# Patient Record
Sex: Female | Born: 1960
Health system: Southern US, Community
[De-identification: ages and names within clinical notes are randomized; demographics above are authoritative.]

## PROBLEM LIST (undated history)

## (undated) DIAGNOSIS — F329 Major depressive disorder, single episode, unspecified: Secondary | ICD-10-CM

## (undated) DIAGNOSIS — F32A Depression, unspecified: Secondary | ICD-10-CM

## (undated) DIAGNOSIS — F419 Anxiety disorder, unspecified: Secondary | ICD-10-CM

## (undated) DIAGNOSIS — F1910 Other psychoactive substance abuse, uncomplicated: Secondary | ICD-10-CM

## (undated) DIAGNOSIS — G2581 Restless legs syndrome: Secondary | ICD-10-CM

## (undated) HISTORY — DX: Major depressive disorder, single episode, unspecified: F32.9

## (undated) HISTORY — DX: Other psychoactive substance abuse, uncomplicated: F19.10

## (undated) HISTORY — DX: Depression, unspecified: F32.A

## (undated) HISTORY — DX: Anxiety disorder, unspecified: F41.9

---

## 2013-04-01 ENCOUNTER — Ambulatory Visit (INDEPENDENT_AMBULATORY_CARE_PROVIDER_SITE_OTHER): Payer: No Typology Code available for payment source | Admitting: Emergency Medicine

## 2013-04-01 VITALS — BP 150/92 | HR 90 | Temp 98.2°F | Resp 17 | Ht 68.0 in | Wt 158.0 lb

## 2013-04-01 DIAGNOSIS — I10 Essential (primary) hypertension: Secondary | ICD-10-CM

## 2013-04-01 DIAGNOSIS — Z1211 Encounter for screening for malignant neoplasm of colon: Secondary | ICD-10-CM

## 2013-04-01 DIAGNOSIS — N951 Menopausal and female climacteric states: Secondary | ICD-10-CM

## 2013-04-01 DIAGNOSIS — F329 Major depressive disorder, single episode, unspecified: Secondary | ICD-10-CM | POA: Insufficient documentation

## 2013-04-01 DIAGNOSIS — R232 Flushing: Secondary | ICD-10-CM

## 2013-04-01 DIAGNOSIS — F3289 Other specified depressive episodes: Secondary | ICD-10-CM

## 2013-04-01 DIAGNOSIS — F32A Depression, unspecified: Secondary | ICD-10-CM

## 2013-04-01 DIAGNOSIS — F411 Generalized anxiety disorder: Secondary | ICD-10-CM

## 2013-04-01 LAB — POCT CBC
HCT, POC: 43.8 % (ref 37.7–47.9)
Hemoglobin: 13.4 g/dL (ref 12.2–16.2)
Lymph, poc: 1.7 (ref 0.6–3.4)
MCH, POC: 28.6 pg (ref 27–31.2)
MCHC: 30.6 g/dL — AB (ref 31.8–35.4)
MPV: 9.8 fL (ref 0–99.8)
RBC: 4.69 M/uL (ref 4.04–5.48)
WBC: 6.7 10*3/uL (ref 4.6–10.2)

## 2013-04-01 MED ORDER — ALPRAZOLAM 0.5 MG PO TABS: ORAL_TABLET | ORAL | Status: DC
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
## 2013-04-02 LAB — TSH: TSH: 0.736 u[IU]/mL (ref 0.350–4.500)

## 2013-04-02 LAB — LIPID PANEL
Cholesterol: 278 mg/dL — ABNORMAL HIGH (ref 0–200)
Triglycerides: 78 mg/dL (ref <150)
VLDL: 16 mg/dL (ref 0–40)

## 2013-04-02 LAB — COMPREHENSIVE METABOLIC PANEL
ALT: 24 U/L (ref 0–35)
CO2: 24 mEq/L (ref 19–32)
Calcium: 9.5 mg/dL (ref 8.4–10.5)
Chloride: 100 mEq/L (ref 96–112)
Creat: 0.64 mg/dL (ref 0.50–1.10)
Glucose, Bld: 83 mg/dL (ref 70–99)
Sodium: 136 mEq/L (ref 135–145)
Total Bilirubin: 1.8 mg/dL — ABNORMAL HIGH (ref 0.3–1.2)
Total Protein: 7.9 g/dL (ref 6.0–8.3)

## 2013-04-06 ENCOUNTER — Ambulatory Visit: Payer: No Typology Code available for payment source | Admitting: Family Medicine

## 2013-04-13 ENCOUNTER — Ambulatory Visit (INDEPENDENT_AMBULATORY_CARE_PROVIDER_SITE_OTHER): Payer: No Typology Code available for payment source | Admitting: Gynecology

## 2013-04-13 ENCOUNTER — Encounter: Payer: Self-pay | Admitting: Gynecology

## 2013-04-13 VITALS — BP 120/78 | Ht 68.0 in | Wt 157.0 lb

## 2013-04-13 DIAGNOSIS — A499 Bacterial infection, unspecified: Secondary | ICD-10-CM

## 2013-04-13 DIAGNOSIS — N926 Irregular menstruation, unspecified: Secondary | ICD-10-CM

## 2013-04-13 DIAGNOSIS — N898 Other specified noninflammatory disorders of vagina: Secondary | ICD-10-CM

## 2013-04-13 DIAGNOSIS — Z01419 Encounter for gynecological examination (general) (routine) without abnormal findings: Secondary | ICD-10-CM

## 2013-04-13 DIAGNOSIS — B9689 Other specified bacterial agents as the cause of diseases classified elsewhere: Secondary | ICD-10-CM

## 2013-04-13 DIAGNOSIS — N760 Acute vaginitis: Secondary | ICD-10-CM

## 2013-04-13 LAB — TSH: TSH: 1.48 u[IU]/mL (ref 0.350–4.500)

## 2013-04-13 LAB — WET PREP FOR TRICH, YEAST, CLUE
Trich, Wet Prep: NONE SEEN
Yeast Wet Prep HPF POC: NONE SEEN

## 2013-04-13 MED ORDER — METRONIDAZOLE 500 MG PO TABS: 500.0000 mg | ORAL_TABLET | Freq: Two times a day (BID) | ORAL | Status: DC

## 2013-04-13 NOTE — Patient Instructions (Addendum)
Followup for ultrasound as scheduled  Colonoscopy  or [facility]  Call to Schedule your mammogram  Facilities in [place]: 1)  [facility], [address] Phone: [phone] 2)  [facility]. [address] [place] Phone: [phone] 3)  Dr. Isaiah Blakes at [facility] [address] Phone: [phone]     Mammogram A mammogram is an X-ray test to find changes in a woman's breast. You should get a mammogram if:  You are 48 years of age or older  You have risk factors.   Your doctor recommends that you have one.  BEFORE THE TEST  Do not schedule the test the week before your period, especially if your breasts are sore during this time.  On the day of your mammogram:  Wash your breasts and armpits well. After washing, do not put on any deodorant or talcum powder on until after your test.   Eat and drink as you usually do.   Take your medicines as usual.   If you are diabetic and take insulin, make sure you:   Eat before coming for your test.   Take your insulin as usual.   If you cannot keep your appointment, call before the appointment to cancel. Schedule another appointment.  TEST  You will need to undress from the waist up. You will put on a hospital gown.   Your breast will be put on the mammogram machine, and it will press firmly on your breast with a piece of plastic called a compression paddle. This will make your breast flatter so that the machine can X-ray all parts of your breast.   Both breasts will be X-rayed. Each breast will be X-rayed from above and from the side. An X-ray might need to be taken again if the picture is not good enough.   The mammogram will last about 15 to 30 minutes.  AFTER THE TEST Finding out the results of your test Ask when your test results will be ready. Make sure you get your test results.  Document Released:  02/14/2009 Document Revised: 11/07/2011 Document Reviewed: 02/14/2009 [facility] Patient Information 2012 [place].

## 2013-04-13 NOTE — Progress Notes (Signed)
Victoria Osborne June 17, 1961 [id number]        51 y.o.  [id number] New patient for annual exam and complaints of breakthrough bleeding. The patient notes in the past year consistent midcycle bleeding where she will require a tampon almost like having a second menses each month. Otherwise doing well without history of gynecologic abnormalities.  Past medical history,surgical history, medications, allergies, family history and social history were all reviewed and documented in the EPIC chart. ROS:  Was performed and pertinent positives and negatives are included in the history.  Exam: Kim assistant Filed Vitals:   04/13/13 0834  BP: 120/78  Height: 5\' 8"  (1.727 m)  Weight: 157 lb (71.215 kg)   General appearance  Normal Skin grossly normal Head/Neck normal with no cervical or supraclavicular adenopathy thyroid normal Lungs  clear Cardiac RR, without RMG Abdominal  soft, nontender, without masses, organomegaly or hernia Breasts  examined lying and sitting without masses, retractions, discharge or axillary adenopathy. Pelvic  Ext/BUS/vagina heavy yellow discharge.  Cervix  normal anterior high in the vagina  Uterus  retroverted/retroflexed, normal size, shape and contour, midline and mobile nontender   Adnexa  Without masses or tenderness    Anus and perineum  normal   Rectovaginal  normal sphincter tone without palpated masses or tenderness.    Assessment/Plan:  52 y.o. G65P1011 female for annual exam.   1. Consistent midcycle bleeding. Discussed possibilities to include hormonal dysfunction versus structural such as polyps. Not having other symptoms such as hot flushes night sweats. We'll start with [place] TSH prolactin and sonohysterogram. Patient will followup for the ultrasound and we'll go from there. 2. Heavy yellow discharge. Patient did not note initially but on questioning states that she did notice a heavier discharge. Wet prep consistent with bacterial vaginosis. Flagyl 500 mg  twice a day x7 days at patient's choice. Alcohol avoidance reviewed. 3. Mammography 01/2012. Due now and she knows to schedule. Names of facilities in [place] provided. SBE monthly reviewed. 4. Pap smear 2013. No Pap smear done today. No history of abnormal Pap smears previously. Plan repeat that 3 year interval. 5. Colonoscopy never. Recommend the current screening guidelines and she will arrange this coming year. 6. Health maintenance. Recently saw Dr. Everlene Farrier at [facility]. Does have elevated cholesterol and LDL. I stressed the need to continue to followup with him in reference to this and possible need for medication treatment. followup ultrasound and lab work.Anastasio Auerbach MD, 9:24 AM 04/13/2013

## 2013-04-16 ENCOUNTER — Encounter: Payer: Self-pay | Admitting: Family Medicine

## 2013-04-16 ENCOUNTER — Ambulatory Visit (INDEPENDENT_AMBULATORY_CARE_PROVIDER_SITE_OTHER): Payer: No Typology Code available for payment source | Admitting: Family Medicine

## 2013-04-16 VITALS — BP 110/76 | HR 65 | Temp 98.0°F | Resp 16 | Ht 68.0 in | Wt 166.2 lb

## 2013-04-16 DIAGNOSIS — F418 Other specified anxiety disorders: Secondary | ICD-10-CM

## 2013-04-16 DIAGNOSIS — F341 Dysthymic disorder: Secondary | ICD-10-CM

## 2013-04-16 DIAGNOSIS — F1021 Alcohol dependence, in remission: Secondary | ICD-10-CM

## 2013-04-16 MED ORDER — FLUOXETINE HCL 40 MG PO CAPS: 40.0000 mg | ORAL_CAPSULE | Freq: Every day | ORAL | Status: DC

## 2013-04-18 DIAGNOSIS — F1021 Alcohol dependence, in remission: Secondary | ICD-10-CM | POA: Insufficient documentation

## 2013-04-18 NOTE — Progress Notes (Signed)
S:  This 52 y.o. Cauc female is here for follow-up of PTSD/ depression; she was evaluated by Dr. Everlene Farrier at [facility] on 04/02/2013. She is taking prescribed medications and feels better; no longer suicidal or having thoughts of self harm. She is planning to  sch appt w/ Dr. Yehuda Mao (psych) in the coming week. She has GYN evaluation earlier this week and MMG is scheduled. Pt states she is attending [facility] meetings. Currently living w/ her parents. Pt has hx of recurrent depression; most recent episode triggered by divorce (not amicable) and financial difficulties. Pt states current dose of Fluoxetine 20 mg not very effective. After discussion, she is agreeable to increasing dose to 30 or 40 mg. She admits some concentration difficulties.  PMHx, Soc Hx and Fam Hx reviewed.  ROS: As per HPI; negative for anorexia, diaphoresis, CP or tightness, palpitations, SOB or DOE, GI problems, HA, dizziness, paresthesias, weakness, tremor, increased irritability or agitation, confusion, halluciantions, SI/HI.  O:  Filed Vitals:   04/16/13 1133  BP: 110/76  Pulse: 65  Temp: 98 F (36.7 C)  Resp: 16   GEN: In NAD; WN,WD. Pt smells off alcohol. HENT: [place]/AT; EOMI w/ injected conj/ no icteric sclerae. Oroph clear and moist. COR: RRR. LUNGS: Normal resp rate and effort. SKIN: W&D; slightly ruddy complexion. NEURO: A&O x 3; CNs intact. Psych- pt appears anxious w/o agitation. Fidgets while seated but conversant and has good eye contact. Speech pattern and thought content are normal. Judgement is sound.  A/P: Depression with anxiety- Increase Fluoxetine dose to 40 mg (pt agreeable to this as there is no 30 mg capsule available).                                              Pt to schedule appt w/ Dr. Casimiro Needle for further medication adjustments.                                              Continue Alprazolam hs prn; I contacted the pharmacy to authorize early refill on this medication so that pt would not run out.  Meds  ordered this encounter  Medications  . FLUoxetine (PROZAC) 40 MG capsule    Sig: Take 1 capsule (40 mg total) by mouth daily.    Dispense:  30 capsule    Refill:  2

## 2013-05-05 ENCOUNTER — Ambulatory Visit: Payer: No Typology Code available for payment source | Admitting: Gynecology

## 2013-05-05 ENCOUNTER — Other Ambulatory Visit: Payer: No Typology Code available for payment source

## 2013-05-11 ENCOUNTER — Other Ambulatory Visit: Payer: Self-pay | Admitting: Gynecology

## 2013-05-11 DIAGNOSIS — N926 Irregular menstruation, unspecified: Secondary | ICD-10-CM

## 2013-05-26 ENCOUNTER — Ambulatory Visit (INDEPENDENT_AMBULATORY_CARE_PROVIDER_SITE_OTHER): Payer: No Typology Code available for payment source | Admitting: Gynecology

## 2013-05-26 ENCOUNTER — Encounter: Payer: Self-pay | Admitting: Gynecology

## 2013-05-26 ENCOUNTER — Ambulatory Visit (INDEPENDENT_AMBULATORY_CARE_PROVIDER_SITE_OTHER): Payer: No Typology Code available for payment source

## 2013-05-26 DIAGNOSIS — N83209 Unspecified ovarian cyst, unspecified side: Secondary | ICD-10-CM

## 2013-05-26 DIAGNOSIS — N912 Amenorrhea, unspecified: Secondary | ICD-10-CM

## 2013-05-26 DIAGNOSIS — N854 Malposition of uterus: Secondary | ICD-10-CM

## 2013-05-26 DIAGNOSIS — N949 Unspecified condition associated with female genital organs and menstrual cycle: Secondary | ICD-10-CM

## 2013-05-26 DIAGNOSIS — N926 Irregular menstruation, unspecified: Secondary | ICD-10-CM

## 2013-05-26 DIAGNOSIS — N882 Stricture and stenosis of cervix uteri: Secondary | ICD-10-CM

## 2013-05-26 DIAGNOSIS — R102 Pelvic and perineal pain: Secondary | ICD-10-CM

## 2013-05-26 DIAGNOSIS — N852 Hypertrophy of uterus: Secondary | ICD-10-CM

## 2013-05-26 LAB — PREGNANCY, URINE: Preg Test, Ur: NEGATIVE

## 2013-05-26 MED ORDER — LIDOCAINE HCL 1 % IJ SOLN
6.0000 mL | Freq: Once | INTRAMUSCULAR | Status: AC
  Administered 2013-05-26: 6 mL

## 2013-05-26 NOTE — Progress Notes (Signed)
Patient presents for sonohysterogram with history of recurrent intermenstrual bleeding. Recent lab work to include TSH [place] prolactin were all normal. Urine pregnancy test today was negative.  Ultrasound shows uterus generous in size. Endometrial echo 11.2 mm. Right ovary with thin walled simple avascular cyst 31 x 23 x 20 mm. Left ovary normal. Cul-de-sac negative.  Procedure: Due to her significant retroversion and retroflexion a paracervical block using 1% lidocaine 8 cc total was placed after cleansing the cervix and upper vagina with Betadine, single-tooth tenaculum anterior lip stabilization required to straighten the cervix and ultimately the catheter was placed under sterile technique and the sonohysterogram was performed with good distention and no abnormalities. Endometrial sample taken. Patient tolerated well.  Assessment and plan: Intermenstrual bleeding. Normal hormone studies to include FSH 2.4. Sonohysterogram with small benign-appearing cyst right ovary otherwise normal. Patient will followup for biopsy results. Options for management of her irregular bleeding discussed to include observation, hormonal manipulation such as low-dose birth control pills or intermittent progesterone withdrawal, endometrial ablation, Mirena IUD, hysterectomy. She does have a history of cigarette smoking but has quit over 20 years ago. The issues of possible increased risk of stroke, heart attack, DVT with birth control pills reviewed. The need for consistent birth control with ablation also discussed. Benefits of Mirena IUD to include contraception as well as menstrual suppression reviewed. In the absence of significant pathology such as leiomyoma, endometrial polyps or submucous myomas and other complaints such as pelvic pain I feel hysterectomy should be last option. I did recommend repeating her ultrasound in 3 months to repeat check the right ovary to make sure the cyst resolves. In the interim she will keep a  menstrual calendar and we will see what her bleeding pattern is and go from there and she is comfortable with this approach.

## 2013-05-26 NOTE — Patient Instructions (Addendum)
Options for management of her irregular bleeding discussed to include observation, hormonal manipulation such as low-dose birth control pills or intermittent progesterone withdrawal, endometrial ablation, Mirena IUD, hysterectomy. She does have a history of cigarette smoking but has quit over 20 years ago. The issues of possible increased risk of stroke, heart attack, DVT with birth control pills reviewed. The need for consistent birth control with ablation also discussed. Benefits of Mirena IUD to include contraception as well as menstrual suppression reviewed. In the absence of significant pathology such as leiomyoma, endometrial polyps or submucous myomas and other complaints such as pelvic pain I feel hysterectomy should be last option. I did recommend repeating her ultrasound in 3 months to repeat check the right ovary to make sure the cyst resolves. In the interim she will keep a menstrual calendar and we will see what her bleeding pattern is and go from there

## 2013-06-12 DIAGNOSIS — L255 Unspecified contact dermatitis due to plants, except food: Secondary | ICD-10-CM | POA: Insufficient documentation

## 2013-08-13 ENCOUNTER — Ambulatory Visit (INDEPENDENT_AMBULATORY_CARE_PROVIDER_SITE_OTHER): Payer: No Typology Code available for payment source | Admitting: Emergency Medicine

## 2013-08-13 VITALS — BP 118/96 | HR 71 | Temp 99.0°F | Resp 18 | Ht 67.75 in | Wt 159.0 lb

## 2013-08-13 DIAGNOSIS — F1011 Alcohol abuse, in remission: Secondary | ICD-10-CM

## 2013-08-13 MED ORDER — ACAMPROSATE CALCIUM 333 MG PO TBEC: 666.0000 mg | DELAYED_RELEASE_TABLET | Freq: Three times a day (TID) | ORAL | Status: DC

## 2013-08-13 NOTE — Progress Notes (Signed)
  Subjective:    Patient ID: Victoria Osborne, female    DOB: 07-10-1961, 52 y.o.   MRN: [id number]  HPI  52 y.o. Female that presents to clinic for alcohol relapse. Went to rehab 1 year ago and has sense had relapse. Pt took campral too help with this process , would like to start this ago. Hasn't taken since December.  Having trouble with depression and is currently going through a divorce. Recently moved from [place] in february and is starting over. Has started back with AA and has appointment with psychologist in a month and a half. Is currently staying with parents. Was unable to see psychiatrist. Last drink was Monday.  Recently started new job in [place] on Aug. 18th. Rehab last year was for one month and was discharged on meds to stop drinking for 3 months.   Review of Systems     Objective:   Physical Exam patient is alert and cooperative. She is conversant. She's not in any distress. She does not appear sweaty or anxious.        Assessment & Plan:  Patient here to discuss her alcohol addiction problem. She is going back today and go on a daily basis she is here with her mother who is going administer her medications she was given the number of Dr. Casimiro Needle in followup. She was instructed to follow up with me in 2 weeks. She will be on Campral to 3 times a day

## 2013-08-25 ENCOUNTER — Other Ambulatory Visit: Payer: No Typology Code available for payment source

## 2013-08-25 ENCOUNTER — Ambulatory Visit: Payer: No Typology Code available for payment source | Admitting: Gynecology

## 2013-09-18 ENCOUNTER — Other Ambulatory Visit: Payer: Self-pay | Admitting: Family Medicine

## 2013-11-08 LAB — CBC WITH DIFFERENTIAL/PLATELET
Basophil #: 0 10*3/uL (ref 0.0–0.1)
Basophil %: 0.7 %
Eosinophil %: 0.7 %
HCT: 42 % (ref 35.0–47.0)
HGB: 13.9 g/dL (ref 12.0–16.0)
Lymphocyte #: 2.2 10*3/uL (ref 1.0–3.6)
Lymphocyte %: 36.4 %
MCHC: 33 g/dL (ref 32.0–36.0)
Monocyte #: 0.5 x10 3/mm (ref 0.2–0.9)
Neutrophil #: 3.2 10*3/uL (ref 1.4–6.5)
Platelet: 227 10*3/uL (ref 150–440)
RBC: 4.82 10*6/uL (ref 3.80–5.20)
WBC: 6 10*3/uL (ref 3.6–11.0)

## 2013-11-08 LAB — URINALYSIS, COMPLETE
Bilirubin,UR: NEGATIVE
Glucose,UR: NEGATIVE mg/dL (ref 0–75)
Ketone: NEGATIVE
Leukocyte Esterase: NEGATIVE
Protein: NEGATIVE
RBC,UR: 1 /HPF (ref 0–5)
Specific Gravity: 1.004 (ref 1.003–1.030)

## 2013-11-08 LAB — COMPREHENSIVE METABOLIC PANEL
Albumin: 4 g/dL (ref 3.4–5.0)
Anion Gap: 9 (ref 7–16)
BUN: 6 mg/dL — ABNORMAL LOW (ref 7–18)
Bilirubin,Total: 1 mg/dL (ref 0.2–1.0)
Calcium, Total: 8.4 mg/dL — ABNORMAL LOW (ref 8.5–10.1)
EGFR (African American): >60
Glucose: 95 mg/dL (ref 65–99)
Osmolality: 279 (ref 275–301)
Potassium: 3.8 mmol/L (ref 3.5–5.1)
SGPT (ALT): 18 U/L (ref 12–78)
Total Protein: 8 g/dL (ref 6.4–8.2)

## 2013-11-08 LAB — DRUG SCREEN, URINE
Amphetamines, Ur Screen: NEGATIVE (ref ?–1000)
MDMA (Ecstasy)Ur Screen: NEGATIVE (ref ?–500)
Opiate, Ur Screen: NEGATIVE (ref ?–300)
Phencyclidine (PCP) Ur S: NEGATIVE (ref ?–25)
Tricyclic, Ur Screen: NEGATIVE (ref ?–1000)

## 2013-11-08 LAB — ETHANOL
Ethanol %: 0.301 % (ref 0.000–0.080)
Ethanol: 301 mg/dL

## 2013-11-09 ENCOUNTER — Inpatient Hospital Stay: Payer: Self-pay | Admitting: Psychiatry

## 2013-11-09 LAB — ETHANOL
Ethanol %: 0.064 % (ref 0.000–0.080)
Ethanol: 64 mg/dL

## 2013-11-15 ENCOUNTER — Ambulatory Visit: Payer: Self-pay | Admitting: Psychiatry

## 2013-12-02 ENCOUNTER — Ambulatory Visit: Payer: Self-pay | Admitting: Psychiatry

## 2014-01-02 ENCOUNTER — Ambulatory Visit: Payer: Self-pay | Admitting: Psychiatry

## 2014-04-18 ENCOUNTER — Telehealth: Payer: Self-pay

## 2014-04-18 NOTE — Telephone Encounter (Signed)
Patient called and left a message 04/15/14 3:46pm stating she wanted to apply to [facility] and "needs a medical record." Returned patient's call this morning and left a message asking what exactly she needs from [place] so that I can complete the request.

## 2014-04-26 NOTE — Telephone Encounter (Signed)
Left message for patient to return call to clarify what she needs.

## 2015-03-24 NOTE — Consult Note (Signed)
Consult: treatment recommendations  This patient was seen at the request of Dr. Bary Leriche, MD in order to explore the possibility of CD-IOP at this inpatient discharge. She was interviewed and oriented to the [facility] (CD-IOP). She was able to express plans at this inpatient discharge of participating in the [facility] CD-IOP.    Electronic Signatures: Laqueta Due (PsyD)  (Signed on 11-Dec-14 00:07)  Authored  Last Updated: 11-Dec-14 00:07 by Laqueta Due (PsyD)

## 2015-03-24 NOTE — Consult Note (Signed)
Brief Consult Note: Diagnosis: Mood disorder NOS, alcohol dependence.   Comments: Ms. Victoria Osborne is too intoxicated for interview. She most likely needs to be admitted.  Electronic Signatures: Orson Slick (MD)  (Signed 08-Dec-14 21:41)  Authored: Brief Consult Note   Last Updated: 08-Dec-14 21:41 by Orson Slick (MD)

## 2015-03-24 NOTE — H&P (Signed)
PATIENT NAME:  Victoria Osborne, Victoria Osborne MR#:  [id number] DATE OF BIRTH:  [birth year]  DATE OF ADMISSION:  11/09/2013  REFERRING PHYSICIAN: Emergency Room MD.   ATTENDING PHYSICIAN: Victoria Krabill B. Bary Leriche, MD  IDENTIFYING DATA: Ms. Victoria Osborne is a 54 year old female with history of alcoholism.   CHIEF COMPLAINT: "My employer brought me here."  HISTORY OF PRESENT ILLNESS: Ms. Victoria Osborne has a long history of drinking, but she has been a functional drunk. A year ago, after she divorced her husband, she relocated from [place], [place], to our area. She currently moved to [place]. 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 She was brought to the Emergency Room the day before when she was found intoxicated and acting strangely at work. Also, multiple suicidal notes were found at her desk. She was brought to the Emergency Room. She was so drunk that we were unable to interview her yesterday. Today, she is sober and able to participate in the conversation. She reports that since she lost her house in [place] 3 years ago, she has been increasingly depressed, but never sought psychiatric help. She describes herself as an episodic drinker, and since she relocated to [place] in February 2014, this is her first drinking episode. This is the first time she has done it at work or wrote a suicide note. She no longer feels suicidal. She has been drinking only since last Friday and came to the Emergency Room on Monday. She reports that on Sunday she was trying to call her family or friends for support, but people were busy. She ended up going to work intoxicated. She has some symptoms of depression with poor sleep, decreased appetite, anhedonia, feeling of guilt, hopelessness, worthlessness, poor energy and concentration, increased anxiety. She denies feeling suicidal, but obviously, when drunk, she did feel like ending her life. She does not remember everything from yesterday. She denies any symptoms of psychosis.  No symptoms suggestive of bipolar mania. She denies, other than alcohol, substance use.   PAST PSYCHIATRIC HISTORY: She attempted suicide once before when she lived in [place] and was about to lose her house. She went to the top of a bridge, but could not make herself jump. She was briefly in psychotherapy in 1995, when she had some issues with her father. She does not want to talk about it. She never tried antidepressants. She has never been in alcohol treatment, although some medication was tried, but she does not remember the name. It was not Antabuse   FAMILY PSYCHIATRIC HISTORY: Father with a drinking problem.   SOCIAL HISTORY: She is now divorced. She has 1 daughter who lives in [place], [place]. She relocated to our area to be closer to her family; however, she apparently does not talk to her father. She is employed as a Pharmacist, hospital in high school. She bought a house in [place]. It is important for her that she returns to work and pays her bills. She has [employer].   REVIEW OF SYSTEMS:  CONSTITUTIONAL: No fevers or chills. No weight changes.  EYES: No double or blurred vision.  ENT: No hearing loss.  RESPIRATORY: No shortness of breath or cough.  CARDIOVASCULAR: No chest pain or orthopnea.  GASTROINTESTINAL: No abdominal pain, nausea, vomiting or diarrhea.  GENITOURINARY: No incontinence or frequency.  ENDOCRINE: No heat or cold intolerance.  LYMPHATIC: No anemia or easy bruising.  INTEGUMENTARY: No acne or rash.  MUSCULOSKELETAL: No muscle or joint pain.  NEUROLOGIC: No tingling or weakness.  PSYCHIATRIC: See history of  present illness for details.   PHYSICAL EXAMINATION:  VITAL SIGNS: Blood pressure 132/92, pulse 121, respirations 20, temperature 98.1.  GENERAL: This is a well-developed female in no acute distress.  HEENT: The pupils are equal, round and reactive to light. Sclerae anicteric.  NECK: Supple. No thyromegaly.  LUNGS: Clear to auscultation. No  dullness to percussion.  HEART: Regular rhythm and rate. No murmurs, rubs or gallops.  ABDOMEN: Soft, nontender, nondistended. Positive bowel sounds.  MUSCULOSKELETAL: Normal muscle strength in all extremities.  SKIN: No rashes or bruises.  LYMPHATIC: No cervical adenopathy.  NEUROLOGIC: Cranial nerves II through XII are intact.   LABORATORY DATA: Chemistries are within normal limits. Blood alcohol level on admission 0.301. LFTs within normal limits. Urine tox screen negative for substances. CBC within normal limits. Urinalysis is not suggestive of urinary tract infection. Serum acetaminophen and salicylates are low.   MENTAL STATUS EXAMINATION ON ADMISSION: The patient is alert and oriented to person, place, time and situation. She is pleasant, polite and cooperative. She maintains good eye contact. Her hygiene is questionable. She is wearing hospital scrubs. Her speech is of normal rhythm, rate and volume. Mood is depressed with anxious affect. Thought process is logical and goal oriented. Thought content: She denies suicidal or homicidal ideation, but was admitted after writing suicidal notes while drunk. There are no delusions or paranoia. There are no auditory or visual hallucinations. Her cognition is grossly intact. She registers 3 out of 3 and recalls 3 out of 3 objects after 5 minutes. She can spell "world" forwards and backwards. She knows the current president. Her insight and judgment are questionable.   SUICIDE RISK ASSESSMENT ON ADMISSION: This is a patient with a long history of alcoholism and some mood instability, who never attempted suicide, who is now under considerable stress and came to the hospital after writing a suicide note. She is at increased risk of suicide.   INITIAL DIAGNOSES:  AXIS I: Alcohol dependence; mood disorder, not otherwise specified.  AXIS II: Deferred.  AXIS III: None.  AXIS IV: Mental illness, substance abuse, social, employment, financial, family  conflict.  AXIS V: Global assessment of functioning 25.   PLAN: The patient was admitted to [facility] for safety, stabilization and medication management. She was initially placed on suicide precautions and was closely monitored for any unsafe behaviors. She underwent full psychiatric and risk assessment. She received pharmacotherapy, individual and group psychotherapy, substance abuse counseling and support from therapeutic milieu.   1. Suicidal ideation: The patient is able to contract for safety.  2. Alcohol detox: She was placed on the standard CIWA protocol. Will monitor for symptoms of alcohol withdrawal.  3. Mood: She is undecided about medications, maybe psychotherapy.  4. Disposition: She will be discharged to home.  ____________________________ Wardell Honour. Bary Leriche, MD jbp:lb D: 11/09/2013 13:56:26 ET T: 11/09/2013 15:09:26 ET JOB#: [id number]  cc: Paislei Dorval B. Bary Leriche, MD, <Dictator> Clovis Fredrickson MD ELECTRONICALLY SIGNED 11/30/2013 4:23

## 2015-03-29 ENCOUNTER — Encounter: Payer: Self-pay | Admitting: Emergency Medicine

## 2015-03-29 ENCOUNTER — Ambulatory Visit (INDEPENDENT_AMBULATORY_CARE_PROVIDER_SITE_OTHER): Payer: Self-pay | Admitting: Emergency Medicine

## 2015-03-29 VITALS — BP 120/96 | HR 112 | Temp 98.2°F | Resp 16 | Ht 67.75 in | Wt 181.0 lb

## 2015-03-29 DIAGNOSIS — F1010 Alcohol abuse, uncomplicated: Secondary | ICD-10-CM

## 2015-03-29 DIAGNOSIS — L409 Psoriasis, unspecified: Secondary | ICD-10-CM

## 2015-03-29 DIAGNOSIS — F1011 Alcohol abuse, in remission: Secondary | ICD-10-CM

## 2015-03-29 DIAGNOSIS — F418 Other specified anxiety disorders: Secondary | ICD-10-CM

## 2015-03-29 MED ORDER — FLUOXETINE HCL 40 MG PO CAPS: 40.0000 mg | ORAL_CAPSULE | Freq: Every day | ORAL | Status: DC

## 2015-03-29 MED ORDER — BETAMETHASONE DIPROPIONATE AUG 0.05 % EX CREA: TOPICAL_CREAM | Freq: Two times a day (BID) | CUTANEOUS | Status: DC

## 2015-03-29 NOTE — Progress Notes (Signed)
   Subjective:    Patient ID: Victoria Osborne, female    DOB: October 27, 1961, 54 y.o.   MRN: [id number] This chart was scribed for Victoria Queen, MD by Zola Button, Medical Scribe. This patient was seen in room 5 and the patient's care was started at 3:09 PM.   HPI HPI Comments: Victoria Osborne is a 54 y.o. female with a hx of depression and psoriasis who presents to the [facility] complaining of darkened areas to both of her elbows, with a nodular area on her left elbow. She also notes a dry area on the top of her forehead. Patient reports hx of psoriasis as a child.  Patient requests refill for Prozac. She takes Prozac for depression and believes she needs to stay on her current dose. Patient has anxiety as well, but she is able to control it without medications. She had been on Campral in the past for alcohol dependence. Patient had problems with alcohol use for about a year when she was dealing with a divorce, but she denies any problems with alcohol currently. She has not been taking Campral and only reports drinking wine occasionally with dinner.  Patient teaches at [employer]  Review of Systems  Psychiatric/Behavioral: Positive for dysphoric mood.       Objective:   Physical Exam CONSTITUTIONAL: Well developed/well nourished. Facial flushing noted HEAD: Normocephalic/atraumatic EYES: EOM/PERRL ENMT: Mucous membranes moist NECK: supple no meningeal signs SPINE: entire spine nontender CV: S1/S2 noted, no murmurs/rubs/gallops noted LUNGS: Lungs are clear to auscultation bilaterally, no apparent distress ABDOMEN: soft, nontender, no rebound or guarding. No upper abdominal tenderness. GU: no cva tenderness NEURO: Pt is awake/alert, moves all extremitiesx4 EXTREMITIES: pulses normal, full ROM SKIN: Psoriatic rash over both elbows. 7x7 mm firm area over the left olecranon. PSYCH: no abnormalities of mood noted        Assessment & Plan:  1.  Depression with anxiety  - FLUoxetine (PROZAC) 40 MG capsule; Take 1 capsule (40 mg total) by mouth daily.   Dispense: 30 capsule; Refill: 11  2. History of ETOH abusePatient states she rarely takes a drink now. She states she has done much better since being on the Prozac. I told her it is very important she make an appointment for a full physical. I told her it is very important she make an appointment for a full physical. She has not had one for years. She said she would  3. Psoriasis  - augmented betamethasone dipropionate (DIPROLENE AF) 0.05 % cream; Apply topically 2 (two) times daily.  Dispense: 30 g; Refill: 2 - Ambulatory referral to Dermatology   I personally performed the services described in this documentation, which was scribed in my presence. The recorded information has been reviewed and is accurate.  Victoria Queen, MD  [facility]  03/29/2015 5:59 PM

## 2015-03-29 NOTE — Patient Instructions (Signed)
Please make an appointment to schedule yourself for a complete physical.

## 2015-05-08 ENCOUNTER — Encounter: Payer: Self-pay | Admitting: *Deleted

## 2015-08-23 ENCOUNTER — Other Ambulatory Visit: Payer: Self-pay

## 2015-08-23 DIAGNOSIS — F418 Other specified anxiety disorders: Secondary | ICD-10-CM

## 2015-08-23 MED ORDER — FLUOXETINE HCL 40 MG PO CAPS: 40.0000 mg | ORAL_CAPSULE | Freq: Every day | ORAL | Status: DC

## 2015-09-05 ENCOUNTER — Encounter: Payer: Self-pay | Admitting: Emergency Medicine

## 2016-01-26 ENCOUNTER — Ambulatory Visit (INDEPENDENT_AMBULATORY_CARE_PROVIDER_SITE_OTHER): Payer: BLUE CROSS/BLUE SHIELD | Admitting: Family Medicine

## 2016-01-26 VITALS — BP 128/88 | HR 90 | Temp 98.2°F | Resp 16 | Ht 67.5 in | Wt 176.8 lb

## 2016-01-26 DIAGNOSIS — M79645 Pain in left finger(s): Secondary | ICD-10-CM

## 2016-01-26 DIAGNOSIS — F1010 Alcohol abuse, uncomplicated: Secondary | ICD-10-CM

## 2016-01-26 DIAGNOSIS — F418 Other specified anxiety disorders: Secondary | ICD-10-CM | POA: Diagnosis not present

## 2016-01-26 DIAGNOSIS — S61215A Laceration without foreign body of left ring finger without damage to nail, initial encounter: Secondary | ICD-10-CM | POA: Diagnosis not present

## 2016-01-26 DIAGNOSIS — S61321A Laceration with foreign body of left index finger with damage to nail, initial encounter: Secondary | ICD-10-CM | POA: Diagnosis not present

## 2016-01-26 DIAGNOSIS — L409 Psoriasis, unspecified: Secondary | ICD-10-CM | POA: Diagnosis not present

## 2016-01-26 DIAGNOSIS — F1011 Alcohol abuse, in remission: Secondary | ICD-10-CM

## 2016-01-26 DIAGNOSIS — S61219A Laceration without foreign body of unspecified finger without damage to nail, initial encounter: Secondary | ICD-10-CM

## 2016-01-26 MED ORDER — BETAMETHASONE DIPROPIONATE AUG 0.05 % EX CREA: TOPICAL_CREAM | Freq: Two times a day (BID) | CUTANEOUS | Status: DC

## 2016-01-26 MED ORDER — HYDROCODONE-ACETAMINOPHEN 5-325 MG PO TABS: 1.0000 | ORAL_TABLET | Freq: Three times a day (TID) | ORAL | Status: DC | PRN

## 2016-01-26 MED ORDER — AMOXICILLIN-POT CLAVULANATE 875-125 MG PO TABS: 1.0000 | ORAL_TABLET | Freq: Two times a day (BID) | ORAL | Status: DC

## 2016-01-26 NOTE — Progress Notes (Signed)
PROCEDURE NOTE: laceration repair Verbal consent obtained from patient.  Local anesthesia with 2cc 0.5% Marcaine.  Wound explored for tendon, ligament damage. Wound scrubbed with soap and water and rinsed. Wound closed with #3 4-0 Prolene 1 horizontal mattress for 4th left finger and 2 horizontal mattress for 2nd left finger. Sutures were loosely approximated. Wound cleansed and dressed.  Jaynee Eagles, PA-C [facility] [phone] 01/26/2016  5:46 PM

## 2016-01-26 NOTE — Patient Instructions (Addendum)

## 2016-01-26 NOTE — Progress Notes (Signed)
 Chief Complaint:  Chief Complaint  Patient presents with  . cut three fingers    hedge trimmer, x 24 hours  . Medication Refill    Diprolene cream 0.05%    HPI: Victoria Osborne is a 55 y.o. female who reports to [facility] today complaining of left 2,3, 4 finger laceration from hedge timmers over 24 hrs ago, she is UTD on there Tdap in 2013.  She has 8/10 pain, 6/10 pain today. Took motrin and tylenol with some relief. No fevers or chills. She has anxiety/depression No weakness or tingling   She has had issue with etoh but not currently, she states she has never had any narcotic dependence. She is not using excessively etoh.   Past Medical History  Diagnosis Date  . Depression   . Anxiety   . Substance abuse    Past Surgical History  Procedure Laterality Date  . Cesarean section     Social History   Social History  . Marital Status: Married    Spouse Name: N/A  . Number of Children: N/A  . Years of Education: N/A   Social History Main Topics  . Smoking status: Former Research scientist (life sciences)  . Smokeless tobacco: None  . Alcohol Use: 6.0 oz/week    10 Standard drinks or equivalent per week     Comment: Rare  . Drug Use: No  . Sexual Activity: Yes    Birth Control/ Protection: Condom   Other Topics Concern  . None   Social History Narrative   Family History  Problem Relation Age of Onset  . Depression Mother   . Diabetes Sister    No Known Allergies Prior to Admission medications   Medication Sig Start Date End Date Taking? Authorizing Provider  augmented betamethasone dipropionate (DIPROLENE AF) 0.05 % cream Apply topically 2 (two) times daily. 03/29/15  Yes Darlyne Russian, MD  FLUoxetine (PROZAC) 40 MG capsule Take 1 capsule (40 mg total) by mouth daily. 08/23/15  Yes Darlyne Russian, MD  amoxicillin-clavulanate (AUGMENTIN) 875-125 MG tablet Take 1 tablet by mouth 2 (two) times daily. 01/26/16    P , DO  HYDROcodone-acetaminophen (NORCO) 5-325 MG tablet Take 1  tablet by mouth every 8 (eight) hours as needed for moderate pain. Take with stool softener, no extra tylenol 01/26/16    P , DO     ROS: The patient denies fevers, chills, night sweats, unintentional weight loss, chest pain, palpitations, wheezing, dyspnea on exertion, nausea, vomiting, abdominal pain, dysuria, hematuria, melena, numbness, weakness, or tingling.   All other systems have been reviewed and were otherwise negative with the exception of those mentioned in the HPI and as above.    PHYSICAL EXAM: Filed Vitals:   01/26/16 1642 01/26/16 1646  BP: 120/90 128/88  Pulse: 90   Temp: 98.2 F (36.8 C)   Resp: 16    Body mass index is 27.27 kg/(m^2).   General: Alert, no acute distress HEENT:  Normocephalic, atraumatic, oropharynx patent. EOMI, PERRLA Cardiovascular:  Regular rate and rhythm, no rubs murmurs or gallops.  No Carotid bruits, radial pulse intact. No pedal edema.  Respiratory: Clear to auscultation bilaterally.  No wheezes, rales, or rhonchi.  No cyanosis, no use of accessory musculature Abdominal: No organomegaly, abdomen is soft and non-tender, positive bowel sounds. No masses. Skin: + wound  Laceration 2,3,4 left fingers  Neurologic: Facial musculature symmetric. Psychiatric: Patient acts appropriately throughout our interaction. Lymphatic: No cervical or submandibular lymphadenopathy Musculoskeletal: Gait intact. No edema,  tenderness   LABS: Results for orders placed or performed in visit on 11/09/13  CBC with Differential/Platelet  Result Value Ref Range   WBC 6.0 3.6-11.0 x10 3/mm 3   RBC 4.82 3.80-5.20 X10 6/mm 3   HGB 13.9 12.0-16.0 g/dL   HCT 42.0 35.0-47.0 %   MCV 87 80-100 fL   MCH 28.8 26.0-34.0 pg   MCHC 33.0 32.0-36.0 g/dL   RDW 16.3 (H) 11.5-14.5 %   Platelet 227 150-440 x10 3/mm 3   Neutrophil % 53.3 %   Lymphocyte % 36.4 %   Monocyte % 8.9 %   Eosinophil % 0.7 %   Basophil % 0.7 %   Neutrophil # 3.2 1.4-6.5 x10 3/mm 3    Lymphocyte # 2.2 1.0-3.6 x10 3/mm 3   Monocyte # 0.5 0.2-0.9 x10 3/mm    Eosinophil # 0.0 0.0-0.7 x10 3/mm 3   Basophil # 0.0 0.0-0.1 x10 3/mm 3  Ethanol  Result Value Ref Range   Ethanol 301 (HH) mg/dL   Ethanol % 0.301 (HH) 0.000-0.080 %  Comprehensive metabolic panel  Result Value Ref Range   Glucose 95 65-99 mg/dL   BUN 6 (L) 7-18 mg/dL   Creatinine 0.55 (L) 0.60-1.30 mg/dL   Sodium 141 136-145 mmol/L   Potassium 3.8 3.5-5.1 mmol/L   Chloride 105 98-107 mmol/L   Co2 27 21-32 mmol/L   Calcium, Total 8.4 (L) 8.5-10.1 mg/dL   SGOT(AST) 23 15-37 Unit/L   SGPT (ALT) 18 12-78 U/L   Alkaline Phosphatase 84 Unit/L   Albumin 4.0 3.4-5.0 g/dL   Total Protein 8.0 6.4-8.2 g/dL   Bilirubin,Total 1.0 0.2-1.0 mg/dL   Osmolality 279 275-301   Anion Gap 9 7-16   EGFR (African American) >60    EGFR (Non-African Amer.) >60   Acetaminophen level  Result Value Ref Range   Acetaminophen < 2 mcg/mL  Salicylate level  Result Value Ref Range   Salicylates, Serum < 1.7 mg/dL  Urinalysis, Complete  Result Value Ref Range   Color - urine Yellow    Clarity - urine Hazy    Glucose,UR Negative 0-75 mg/dL   Bilirubin,UR Negative NEGATIVE   Ketone Negative NEGATIVE   Specific Gravity 1.004 1.003-1.030   Blood 2+ NEGATIVE   Ph 7.0 4.5-8.0   Protein Negative NEGATIVE   Nitrite Negative NEGATIVE   Leukocyte Esterase Negative NEGATIVE   RBC,UR 1 /HPF 0-5 /HPF   WBC UR NONE SEEN 0-5 /HPF   Bacteria TRACE NONE SEEN   Squamous Epithelial 2 /HPF   Drug Screen, Urine  Result Value Ref Range   Tricyclic, Ur Screen NEGATIVE Cutoff-1000 ng/mL   Amphetamines, Ur Screen NEGATIVE Cutoff-1000 ng/mL   MDMA (Ecstasy)Ur Screen NEGATIVE Cutoff-500 ng/mL   Cocaine Metabolite,Ur Lane NEGATIVE Cutoff-300 ng/mL   Opiate, Ur Screen NEGATIVE Cutoff-300 ng/mL   Phencyclidine (PCP) Ur S NEGATIVE Cutoff-25 ng/mL   Cannabinoid 50 Ng, Ur  NEGATIVE Cutoff-50 ng/mL   Barbiturates, Ur Screen NEGATIVE Cutoff-200 ng/mL     Methadone, Ur Screen NEGATIVE Cutoff-300 ng/mL   Benzodiazepine, Ur Scrn NEGATIVE Cutoff-200 ng/mL  Ethanol  Result Value Ref Range   Ethanol 64 mg/dL   Ethanol % 0.064 0.000-0.080 %     EKG/XRAY:   Primary read interpreted by Dr. Marin Comment at [facility].   ASSESSMENT/PLAN: Encounter Diagnoses  Name Primary?  . Finger laceration, initial encounter Yes  . Finger pain, left   . Psoriasis   . History of ETOH abuse   . Depression with anxiety  Rx Augmentin , norco prn for pain #20, take ibuprofen prn first  Precautions  given for narcotic use, infection  Stitches loosely Fu in 48 hours.    Gross sideeffects, risk and benefits, and alternatives of medications d/w patient. Patient is aware that all medications have potential sideeffects and we are unable to predict every sideeffect or drug-drug interaction that may occur.    DO  01/26/2016 7:07 PM

## 2016-01-28 ENCOUNTER — Ambulatory Visit (INDEPENDENT_AMBULATORY_CARE_PROVIDER_SITE_OTHER): Payer: BLUE CROSS/BLUE SHIELD | Admitting: Urgent Care

## 2016-01-28 VITALS — BP 118/78 | HR 74 | Temp 98.2°F | Resp 12 | Ht 67.0 in | Wt 179.0 lb

## 2016-01-28 DIAGNOSIS — L03012 Cellulitis of left finger: Secondary | ICD-10-CM

## 2016-01-28 DIAGNOSIS — S61219D Laceration without foreign body of unspecified finger without damage to nail, subsequent encounter: Secondary | ICD-10-CM

## 2016-01-28 NOTE — Progress Notes (Signed)
    MRN: [id number] DOB: 03/08/1961  Subjective:   Victoria Osborne is a 55 y.o. female presenting for follow up on 3 finger lacerations. Patient was seen on 01/26/2016, sutures loosely approximated lacerations over 2nd and 4th left fingers. Patient was started on Augmentin. Reports significant improvement in her pain, swelling, redness. Admits that her fingers still feel warm.  Victoria Osborne has a current medication list which includes the following prescription(s): amoxicillin-clavulanate, augmented betamethasone dipropionate, fluoxetine, and hydrocodone-acetaminophen. Also has No Known Allergies.  Victoria Osborne  has a past medical history of Depression; Anxiety; and Substance abuse. Also  has past surgical history that includes Cesarean section.  Objective:   Vitals: BP 118/78 mmHg  Pulse 74  Temp(Src) 98.2 F (36.8 C) (Oral)  Resp 12  Ht 5\' 7"  (1.702 m)  Wt 179 lb (81.194 kg)  BMI 28.03 kg/m2  SpO2 98%  LMP 12/26/2015  Physical Exam  Constitutional: She is oriented to person, place, and time. She appears well-developed and well-nourished.  Cardiovascular: Normal rate.   Pulmonary/Chest: Effort normal.  Musculoskeletal:       Hands: Neurological: She is alert and oriented to person, place, and time.    Assessment and Plan :   1. Finger laceration, subsequent encounter 2. Cellulitis of finger of left hand - Stable and improved, patient to rtc in 2 days to assure further progression. Consider removing sutures if this is not the case to evaluation for infection or abscess. Patient is to continue Augmentin. RTC if symptoms worsen prior to f/u.  Jaynee Eagles, PA-C [facility] [phone] 01/28/2016 5:17 PM

## 2016-02-10 ENCOUNTER — Other Ambulatory Visit: Payer: Self-pay | Admitting: Emergency Medicine

## 2016-02-14 ENCOUNTER — Telehealth: Payer: Self-pay | Admitting: Family Medicine

## 2016-02-14 NOTE — Telephone Encounter (Signed)
Left a message for patient to return call.  Need to find out if she has had a colonoscopy, if so where and when, if not we need to order one for her.  Also, has she had a recent  Pap smear, if so where and when?   And a mammogram

## 2016-02-27 ENCOUNTER — Encounter: Payer: Self-pay | Admitting: Family Medicine

## 2016-02-27 ENCOUNTER — Ambulatory Visit (INDEPENDENT_AMBULATORY_CARE_PROVIDER_SITE_OTHER): Payer: BLUE CROSS/BLUE SHIELD | Admitting: Family Medicine

## 2016-02-27 ENCOUNTER — Encounter: Payer: Self-pay | Admitting: Emergency Medicine

## 2016-02-27 ENCOUNTER — Encounter: Payer: Self-pay | Admitting: Gastroenterology

## 2016-02-27 VITALS — BP 130/90 | HR 93 | Temp 98.3°F | Resp 16 | Ht 68.25 in | Wt 178.6 lb

## 2016-02-27 DIAGNOSIS — Z124 Encounter for screening for malignant neoplasm of cervix: Secondary | ICD-10-CM

## 2016-02-27 DIAGNOSIS — Z1231 Encounter for screening mammogram for malignant neoplasm of breast: Secondary | ICD-10-CM | POA: Diagnosis not present

## 2016-02-27 DIAGNOSIS — F329 Major depressive disorder, single episode, unspecified: Secondary | ICD-10-CM

## 2016-02-27 DIAGNOSIS — F411 Generalized anxiety disorder: Secondary | ICD-10-CM | POA: Diagnosis not present

## 2016-02-27 DIAGNOSIS — Z131 Encounter for screening for diabetes mellitus: Secondary | ICD-10-CM | POA: Diagnosis not present

## 2016-02-27 DIAGNOSIS — L989 Disorder of the skin and subcutaneous tissue, unspecified: Secondary | ICD-10-CM

## 2016-02-27 DIAGNOSIS — Z0000 Encounter for general adult medical examination without abnormal findings: Secondary | ICD-10-CM | POA: Diagnosis not present

## 2016-02-27 DIAGNOSIS — Z139 Encounter for screening, unspecified: Secondary | ICD-10-CM | POA: Diagnosis not present

## 2016-02-27 DIAGNOSIS — Z1321 Encounter for screening for nutritional disorder: Secondary | ICD-10-CM | POA: Diagnosis not present

## 2016-02-27 DIAGNOSIS — Z1211 Encounter for screening for malignant neoplasm of colon: Secondary | ICD-10-CM | POA: Diagnosis not present

## 2016-02-27 DIAGNOSIS — M25571 Pain in right ankle and joints of right foot: Secondary | ICD-10-CM

## 2016-02-27 DIAGNOSIS — Z1389 Encounter for screening for other disorder: Secondary | ICD-10-CM

## 2016-02-27 DIAGNOSIS — N939 Abnormal uterine and vaginal bleeding, unspecified: Secondary | ICD-10-CM | POA: Diagnosis not present

## 2016-02-27 DIAGNOSIS — Z13220 Encounter for screening for lipoid disorders: Secondary | ICD-10-CM | POA: Diagnosis not present

## 2016-02-27 DIAGNOSIS — F32A Depression, unspecified: Secondary | ICD-10-CM

## 2016-02-27 LAB — POC MICROSCOPIC URINALYSIS (UMFC): Mucus: ABSENT

## 2016-02-27 LAB — LIPID PANEL
Cholesterol: 269 mg/dL — ABNORMAL HIGH (ref 125–200)
HDL: 69 mg/dL (ref >=46)
LDL Cholesterol: 179 mg/dL — ABNORMAL HIGH (ref <130)
TRIGLYCERIDES: 104 mg/dL (ref <150)
Total CHOL/HDL Ratio: 3.9 Ratio (ref <=5.0)
VLDL: 21 mg/dL (ref <30)

## 2016-02-27 LAB — CBC
HEMATOCRIT: 40.5 % (ref 36.0–46.0)
Hemoglobin: 13.7 g/dL (ref 12.0–15.0)
MCH: 31.8 pg (ref 26.0–34.0)
MCHC: 33.8 g/dL (ref 30.0–36.0)
MCV: 94 fL (ref 78.0–100.0)
MPV: 11.1 fL (ref 8.6–12.4)
Platelets: 230 10*3/uL (ref 150–400)
RBC: 4.31 MIL/uL (ref 3.87–5.11)
RDW: 14.7 % (ref 11.5–15.5)
WBC: 8.1 10*3/uL (ref 4.0–10.5)

## 2016-02-27 LAB — VITAMIN D 25 HYDROXY (VIT D DEFICIENCY, FRACTURES): Vit D, 25-Hydroxy: 32 ng/mL (ref 30–100)

## 2016-02-27 LAB — POCT URINALYSIS DIP (MANUAL ENTRY)
BILIRUBIN UA: NEGATIVE
BILIRUBIN UA: NEGATIVE
Blood, UA: NEGATIVE
Glucose, UA: NEGATIVE
Leukocytes, UA: NEGATIVE
NITRITE UA: NEGATIVE
PH UA: 6.5
Protein Ur, POC: NEGATIVE
Spec Grav, UA: 1.025
Urobilinogen, UA: 1

## 2016-02-27 LAB — COMPLETE METABOLIC PANEL WITH GFR
ALT: 42 U/L — AB (ref 6–29)
AST: 49 U/L — AB (ref 10–35)
Albumin: 4.1 g/dL (ref 3.6–5.1)
Alkaline Phosphatase: 90 U/L (ref 33–130)
BILIRUBIN TOTAL: 1.2 mg/dL (ref 0.2–1.2)
BUN: 8 mg/dL (ref 7–25)
CO2: 25 mmol/L (ref 20–31)
CREATININE: 0.61 mg/dL (ref 0.50–1.05)
Calcium: 8.6 mg/dL (ref 8.6–10.4)
Chloride: 99 mmol/L (ref 98–110)
GFR, Est Non African American: >89 mL/min (ref >=60)
GLUCOSE: 96 mg/dL (ref 65–99)
Potassium: 4.2 mmol/L (ref 3.5–5.3)
SODIUM: 135 mmol/L (ref 135–146)
TOTAL PROTEIN: 6.9 g/dL (ref 6.1–8.1)

## 2016-02-27 LAB — TSH: TSH: 1.56 mIU/L

## 2016-02-27 NOTE — Progress Notes (Signed)
Subjective:    Patient ID: Victoria Osborne, female    DOB: 1961-01-24, 55 y.o.   MRN: [id number]  HPI This is a 55 yo female who presents for CPE. 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 She enjoys her work. She is in a long term monogamous relationship.   Last CPE- many years Mammo- several years ago Pap- many years ago, does not want STD screening  Colonoscopy- willing to have scheduled Tdap- 12/03/11 Flu- declines Eye- annual Dental- annual Exercise- periodic, likes to hike  Periods irregular, sometimes heavy. Some hot flashes.   Her depression is well controlled. Her anxiety bothers her with any changes. Panic attacks 1-2 times a month. Usually can take a tylenol PM and has good results with sleep. Has had some therapy in the past that she found helpful. Feels like mood is stable on fluoxetine.    Review of Systems  Constitutional: Positive for diaphoresis (occasional night sweats).  HENT: Positive for dental problem (under care of dentist).   Eyes: Negative.   Respiratory: Positive for cough (occasional, dry).   Cardiovascular: Negative.   Gastrointestinal: Negative.   Endocrine: Negative.   Genitourinary: Positive for vaginal bleeding and menstrual problem (irregular).  Musculoskeletal:       Pain and swelling of right foot, s/p distant trauma. Cracking on ball of foot.   Skin: Positive for color change (some places have discolored. Alot of sun exposure as a child. ).  Allergic/Immunologic: Negative.   Neurological: Negative.   Hematological: Negative.   Psychiatric/Behavioral: The patient is nervous/anxious.        Objective:   Physical Exam Physical Exam  Constitutional: She is oriented to person, place, and time. She appears well-developed and well-nourished. No distress.  HENT:  Head: Normocephalic and atraumatic.  Right Ear: External ear normal.  Left Ear: External ear normal.  Nose: Nose normal.  Mouth/Throat:  Oropharynx is clear and moist. No oropharyngeal exudate.  Eyes: Conjunctivae are normal. Pupils are equal, round, and reactive to light.  Neck: Normal range of motion. Neck supple. No JVD present. No thyromegaly present.  Cardiovascular: Normal rate, regular rhythm, normal heart sounds and intact distal pulses.   Pulmonary/Chest: Effort normal and breath sounds normal. Right breast exhibits no inverted nipple, no mass, no nipple discharge, no skin change and no tenderness. Left breast exhibits no inverted nipple, no mass, no nipple discharge, no skin change and no tenderness. Breasts are symmetrical.  Abdominal: Soft. Bowel sounds are normal. She exhibits no distension and no mass. There is no tenderness. There is no rebound and no guarding.  Genitourinary: Vagina normal. Pelvic exam was performed with patient supine. There is no rash, tenderness, lesion or injury on the right labia. There is no rash, tenderness, lesion or injury on the left labia. Cervix exhibits no motion tenderness and no discharge. Thin, light brown discharge.  Musculoskeletal: Normal range of motion. Right foot with fullness over lateral metatarsals and cracking on ball of foot.   Lymphadenopathy:    She has no cervical adenopathy.  Neurological: She is alert and oriented to person, place, and time. She has normal reflexes.  Skin: Skin is warm and dry. She is not diaphoretic. 6 mm round, raised, hyperpigmented lesion on back, slightly flaky.  Psychiatric: She has a normal mood and affect. Her behavior is normal. Judgment and thought content normal.  Vitals reviewed.     BP 130/90 mmHg  Pulse 93  Temp(Src) 98.3 F (36.8 C) (  Oral)  Resp 16  Ht 5' 8.25" (1.734 m)  Wt 178 lb 9.6 oz (81.012 kg)  BMI 26.94 kg/m2  SpO2 98%  LMP 12/27/2015 Wt Readings from Last 3 Encounters:  02/27/16 178 lb 9.6 oz (81.012 kg)  01/28/16 179 lb (81.194 kg)  01/26/16 176 lb 12.8 oz (80.196 kg)       Assessment & Plan:  1. Annual  physical exam -- Discussed and encouraged healthy lifestyle choices- adequate sleep, regular exercise, stress management and healthy food choices.   2. Visit for screening mammogram - MM Digital Screening; Future  3. Screening for colon cancer - Ambulatory referral to Gastroenterology  4. Abnormal uterine bleeding - suspect perimenopausal state and discussed with patient - CBC - Hemoglobin A1c - COMPLETE METABOLIC PANEL WITH GFR  5. Generalized anxiety disorder - TSH  6. Depression - TSH - continue Fluoxetine, suggested therapy if symptoms get worse, encouraged self care measures, meditation, exercise, yoga  7. Screening for hematuria or proteinuria - POCT urinalysis dipstick - POCT Microscopic Urinalysis ([facility])  8. Screening for lipid disorders - Lipid panel  9. Screening for diabetes mellitus - Hemoglobin A1c  10. Pain in joint, ankle and foot, right - Ambulatory referral to Podiatry  11. Encounter for vitamin deficiency screening - VITAMIN D 25 Hydroxy (Vit-D Deficiency, Fractures)  12. Skin lesion of back - Ambulatory referral to Dermatology  13. Screening for cervical cancer - Pap IG, CT/NG w/ reflex HPV when ASC-U   Clarene Reamer, FNP-BC  [facility] [place] [facility]  02/27/2016 9:47 PM

## 2016-02-27 NOTE — Patient Instructions (Addendum)
We recommend that you schedule a mammogram for breast cancer screening. Typically, you do not need a referral to do this. Please contact a local imaging center to schedule your mammogram.  Keeping You Healthy  Get These Tests  Blood Pressure- Have your blood pressure checked by your healthcare provider at least once a year.  Normal blood pressure is 120/80.  Weight- Have your body mass index (BMI) calculated to screen for obesity.  BMI is a measure of body fat based on height and weight.  You can calculate your own BMI at [employer].it  Cholesterol- Have your cholesterol checked every year.  Diabetes- Have your blood sugar checked every year if you have high blood pressure, high cholesterol, a family history of diabetes or if you are overweight.  Pap Test - Have a pap test every 1 to 5 years if you have been sexually active.  If you are older than 65 and recent pap tests have been normal you may not need additional pap tests.  In addition, if you have had a hysterectomy  for benign disease additional pap tests are not necessary.  Mammogram-Yearly mammograms are essential for early detection of breast cancer  Screening for Colon Cancer- Colonoscopy starting at age 25. Screening may begin sooner depending on your family history and other health conditions.  Follow up colonoscopy as directed by your Gastroenterologist.  Screening for Osteoporosis- Screening begins at age 84 with bone density scanning, sooner if you are at higher risk for developing Osteoporosis.  Get these medicines  Calcium with Vitamin D- Your body requires 1200-1500 mg of Calcium a day and [phone] IU of Vitamin D a day.  You can only absorb 500 mg of Calcium at a time therefore Calcium must be taken in 2 or 3 separate doses throughout the day.  Hormones- Hormone therapy has been associated with increased risk for certain cancers and heart disease.  Talk to your healthcare provider about if you need relief  from menopausal symptoms.  Aspirin- Ask your healthcare provider about taking Aspirin to prevent Heart Disease and Stroke.  Get these Immuniztions  Flu shot- Every fall  Pneumonia shot- Once after the age of 12; if you are younger ask your healthcare provider if you need a pneumonia shot.  Tetanus- Every ten years.  Zostavax- Once after the age of 34 to prevent shingles.  Take these steps  Don't smoke- Your healthcare provider can help you quit. For tips on how to quit, ask your healthcare provider or go to www.smokefree.gov or call 1-800 QUIT-NOW.  Be physically active- Exercise 5 days a week for a minimum of 30 minutes.  If you are not already physically active, start slow and gradually work up to 30 minutes of moderate physical activity.  Try walking, dancing, bike riding, swimming, etc.  Eat a healthy diet- Eat a variety of healthy foods such as fruits, vegetables, whole grains, low fat milk, low fat cheeses, yogurt, lean meats, chicken, fish, eggs, dried beans, tofu, etc.  For more information go to www.thenutritionsource.org  Dental visit- Brush and floss teeth twice daily; visit your dentist twice a year.  Eye exam- Visit your Optometrist or Ophthalmologist yearly.  Drink alcohol in moderation- Limit alcohol intake to one drink or less a day.  Never drink and drive.  Depression- Your emotional health is as important as your physical health.  If you're feeling down or losing interest in things you normally enjoy, please talk to your healthcare provider.  Seat Belts- can save your  life; always wear one  Smoke/Carbon Monoxide detectors- These detectors need to be installed on the appropriate level of your home.  Replace batteries at least once a year.  Violence- If anyone is threatening or hurting you, please tell your healthcare provider.  Living Will/ Health care power of attorney- Discuss with your healthcare provider and family. [facility] - [phone]  *ask  for the Radiology Department [facility] - [phone] or [phone]  [facility] - [phone] [place] [phone] [facility] - [phone]  *ask for the [facility] - [phone]  *ask for the Radiology Department [facility] - [phone]  *ask for the Mammography Department [facility] - [phone]    IF you received an x-ray today, you will receive an invoice from [facility]. Please contact [facility] at [phone] with questions or concerns regarding your invoice.   IF you received labwork today, you will receive an invoice from [employer]. Please contact [facility] at [phone] with questions or concerns regarding your invoice.   Our billing staff will not be able to assist you with questions regarding bills from these companies.  You will be contacted with the lab results as soon as they are available. The fastest way to get your results is to activate your My Chart account. Instructions are located on the last page of this paperwork. If you have not heard from [place] regarding the results in 2 weeks, please contact this office.

## 2016-02-28 LAB — HEMOGLOBIN A1C
Hgb A1c MFr Bld: 5.3 % (ref <5.7)
MEAN PLASMA GLUCOSE: 105 mg/dL

## 2016-02-29 LAB — PAP IG, CT-NG, RFX HPV ASCU
CHLAMYDIA PROBE AMP: NOT DETECTED
GC Probe Amp: NOT DETECTED

## 2016-03-01 ENCOUNTER — Encounter: Payer: Self-pay | Admitting: Family Medicine

## 2016-03-01 LAB — HUMAN PAPILLOMAVIRUS, HIGH RISK: HPV DNA High Risk: NOT DETECTED

## 2016-03-08 ENCOUNTER — Ambulatory Visit: Payer: BLUE CROSS/BLUE SHIELD | Admitting: Podiatry

## 2016-03-25 ENCOUNTER — Encounter: Payer: BLUE CROSS/BLUE SHIELD | Admitting: Podiatry

## 2016-04-01 ENCOUNTER — Ambulatory Visit: Payer: Self-pay

## 2016-04-08 ENCOUNTER — Encounter: Payer: Self-pay | Admitting: Gastroenterology

## 2016-05-01 NOTE — Progress Notes (Signed)
This encounter was created in error - please disregard.

## 2016-06-06 ENCOUNTER — Encounter: Payer: BLUE CROSS/BLUE SHIELD | Admitting: Podiatry

## 2016-06-06 ENCOUNTER — Ambulatory Visit: Payer: Self-pay

## 2016-08-01 NOTE — Progress Notes (Signed)
This encounter was created in error - please disregard.

## 2017-12-09 ENCOUNTER — Telehealth: Payer: Self-pay | Admitting: Physician Assistant

## 2017-12-09 ENCOUNTER — Ambulatory Visit: Payer: BLUE CROSS/BLUE SHIELD | Admitting: Family Medicine

## 2017-12-09 NOTE — Telephone Encounter (Signed)
Left message per DPR to remind pt of the appt in the morning 12/10/17

## 2017-12-10 ENCOUNTER — Other Ambulatory Visit: Payer: Self-pay

## 2017-12-10 ENCOUNTER — Ambulatory Visit (INDEPENDENT_AMBULATORY_CARE_PROVIDER_SITE_OTHER): Payer: BLUE CROSS/BLUE SHIELD | Admitting: Physician Assistant

## 2017-12-10 ENCOUNTER — Encounter: Payer: Self-pay | Admitting: Physician Assistant

## 2017-12-10 VITALS — BP 110/80 | HR 76 | Temp 98.6°F | Resp 16 | Ht 68.0 in | Wt 178.2 lb

## 2017-12-10 DIAGNOSIS — Z0000 Encounter for general adult medical examination without abnormal findings: Secondary | ICD-10-CM | POA: Diagnosis not present

## 2017-12-10 DIAGNOSIS — E559 Vitamin D deficiency, unspecified: Secondary | ICD-10-CM

## 2017-12-10 DIAGNOSIS — L409 Psoriasis, unspecified: Secondary | ICD-10-CM

## 2017-12-10 DIAGNOSIS — Z13220 Encounter for screening for lipoid disorders: Secondary | ICD-10-CM | POA: Diagnosis not present

## 2017-12-10 DIAGNOSIS — Z1211 Encounter for screening for malignant neoplasm of colon: Secondary | ICD-10-CM | POA: Diagnosis not present

## 2017-12-10 DIAGNOSIS — J22 Unspecified acute lower respiratory infection: Secondary | ICD-10-CM

## 2017-12-10 DIAGNOSIS — F411 Generalized anxiety disorder: Secondary | ICD-10-CM | POA: Diagnosis not present

## 2017-12-10 DIAGNOSIS — Z1329 Encounter for screening for other suspected endocrine disorder: Secondary | ICD-10-CM

## 2017-12-10 DIAGNOSIS — Z130 Encounter for screening for diseases of the blood and blood-forming organs and certain disorders involving the immune mechanism: Secondary | ICD-10-CM | POA: Diagnosis not present

## 2017-12-10 LAB — POCT URINALYSIS DIP (MANUAL ENTRY)
Bilirubin, UA: NEGATIVE
Blood, UA: NEGATIVE
Glucose, UA: NEGATIVE mg/dL
Ketones, POC UA: NEGATIVE mg/dL
Leukocytes, UA: NEGATIVE
Nitrite, UA: NEGATIVE
Protein Ur, POC: NEGATIVE mg/dL
Spec Grav, UA: 1.01 (ref 1.010–1.025)
Urobilinogen, UA: 0.2 E.U./dL
pH, UA: 6 (ref 5.0–8.0)

## 2017-12-10 MED ORDER — FLUOXETINE HCL 40 MG PO CAPS
ORAL_CAPSULE | ORAL | 3 refills | Status: AC
Start: 2017-12-10 — End: ?

## 2017-12-10 MED ORDER — AZITHROMYCIN 250 MG PO TABS: ORAL_TABLET | ORAL | 0 refills | Status: DC

## 2017-12-10 MED ORDER — ALPRAZOLAM 0.5 MG PO TABS: 0.5000 mg | ORAL_TABLET | Freq: Every evening | ORAL | 0 refills | Status: DC | PRN

## 2017-12-10 MED ORDER — BETAMETHASONE DIPROPIONATE AUG 0.05 % EX CREA: TOPICAL_CREAM | Freq: Two times a day (BID) | CUTANEOUS | 2 refills | Status: DC

## 2017-12-10 NOTE — Progress Notes (Signed)
Primary Care at [place], [place] [address] [phone]  Date:  12/10/2017   Name:  Victoria Osborne   DOB:  04/20/1961   MRN:  [id number]  PCP:  Darlyne Russian, MD    Chief Complaint: Annual Exam (no pap) and Psoriasis (need med)   History of Present Illness:  This is a 57 y.o. female with PMH depression and alcohol dependance who is presenting for CPE. Last annual 01/2016. 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    Anxiety and depression - controlled on fluoxetine '40mg'$  qd and Xanax 0.'5mg'$  PRN. She uses deep breathing techniques and steam from hot shower to help control panic attacks and anxiety. H/o panic attacks 1-2 x/month  Psoriasis - current flare on both arms. Usually controlled with diprolene.  Complaints: cold x 10 days. Cough, congestion, fatigue, restless sleep.  LMP: Irregular periods x 5 years. Sometimes she has a period x 2 months, sometimes will go without period x 6 months. Sometimes flows are very heavy and will wake her up in the middle of the night.  She has been evaluated by GYN about 5 years ago. No OCPs.  Last pap: 01/2016 ACUS, HPV negative.  Sexual history: long term monogamous relationship. Only sexual partner. No std screening.  Immunizations: tdap 12/03/2011 Dentist: annual Eye: annual Diet/Exercise: enjoys hiking Fam hx: mother depression; sister DM  Tobacco/alcohol/substance use: non smoker, no drug use, 6 drinks/week  Mammogram: several years ago.  Colonoscopy: never  Review of Systems:  Review of Systems  Constitutional: Positive for fatigue. Negative for chills, diaphoresis and fever.  HENT: Positive for congestion and sinus pressure. Negative for postnasal drip, rhinorrhea, sinus pain, sneezing and sore throat.   Respiratory: Positive for cough. Negative for chest tightness, shortness of breath and wheezing.   Cardiovascular: Negative for chest pain and palpitations.  Gastrointestinal: Negative for abdominal pain,  diarrhea, nausea and vomiting.  Genitourinary: Positive for menstrual problem. Negative for decreased urine volume, difficulty urinating, dysuria, enuresis, flank pain, frequency, hematuria and urgency.  Musculoskeletal: Negative for back pain.  Skin: Positive for rash.  Neurological: Negative for dizziness, weakness, light-headedness and headaches.  Psychiatric/Behavioral: Negative for sleep disturbance.    Patient Active Problem List   Diagnosis Date Noted  . History of alcohol dependence ([place]) 04/18/2013  . Depression 04/01/2013    Prior to Admission medications   Medication Sig Start Date End Date Taking? Authorizing Provider  ALPRAZolam Duanne Moron) 0.5 MG tablet Take 0.5 mg by mouth at bedtime as needed for anxiety.   Yes [provider]  augmented betamethasone dipropionate (DIPROLENE AF) 0.05 % cream Apply topically 2 (two) times daily. 01/26/16  Yes Le, Thao P, DO  FLUoxetine (PROZAC) 40 MG capsule TAKE 1 CAPSULE (40 MG TOTAL) BY MOUTH DAILY. 02/11/16  Yes Darlyne Russian, MD    No Known Allergies  Past Surgical History:  Procedure Laterality Date  . CESAREAN SECTION      Social History   Tobacco Use  . Smoking status: Former Research scientist (life sciences)  . Smokeless tobacco: Never Used  Substance Use Topics  . Alcohol use: Yes    Alcohol/week: 6.0 oz    Types: 10 Standard drinks or equivalent per week    Comment: Rare  . Drug use: No    Family History  Problem Relation Age of Onset  . Depression Mother   . Diabetes Sister     Medication list has been reviewed and updated.  Physical Examination:  Physical  Exam  Constitutional: She is oriented to person, place, and time. She appears well-developed and well-nourished. No distress.  HENT:  Head: Normocephalic and atraumatic.  Mouth/Throat: Oropharynx is clear and moist.  Eyes: Conjunctivae and EOM are normal. Pupils are equal, round, and reactive to light.  Neck: Normal range of motion. Neck supple. No thyromegaly present.    Cardiovascular: Normal rate, regular rhythm and normal heart sounds.  No murmur heard. Pulmonary/Chest: Effort normal and breath sounds normal. She has no wheezes.  Abdominal: Soft. Bowel sounds are normal. She exhibits no mass. There is no tenderness.  Musculoskeletal: Normal range of motion.  Neurological: She is alert and oriented to person, place, and time. She has normal reflexes.  Skin: Skin is warm and dry. Rash noted.     Dry, scaling macular lesions extensor surfaces b/l arms.   Psychiatric: She has a normal mood and affect. Her behavior is normal. Judgment and thought content normal.  Vitals reviewed.   BP 110/80   Pulse 76   Temp 98.6 F (37 C) (Oral)   Resp 16   Ht '5\' 8"'$  (1.727 m)   Wt 178 lb 3.2 oz (80.8 kg)   SpO2 96%   BMI 27.10 kg/m   Assessment and Plan: 1. Annual physical exam - Pt presents for annual exam. C/o cough x >10 days, irregular periods x 5 years, psoriasis flare. Needs med refill for anxiety depression, which is well controlled.  She was referred for colonoscopy at last annual exam, however did not make appt. Will refer. Pt needs MM, information provided. She plans to make appt with GYN for evaluation of AUB, which is likely perimenopausal - discussed with pt. Last pap: 01/2016 +ACUS, HPV negative. Next PAP 01/2019. Routine labs are pending, will contact with results.   2. Vitamin D deficiency - VITAMIN D 25 Hydroxy (Vit-D Deficiency, Fractures)  3. Screening for endocrine disorder - CMP14+EGFR - POCT urinalysis dipstick  4. Screening, lipid - Lipid panel  5. Screening for deficiency anemia - CBC with Differential/Platelet  6. Lower respiratory infection - azithromycin (ZITHROMAX) 250 MG tablet; Take 2 tabs PO x 1 dose, then 1 tab PO QD x 4 days  Dispense: 6 tablet; Refill: 0  7. Screen for colon cancer - Ambulatory referral to Gastroenterology  8. Psoriasis - augmented betamethasone dipropionate (DIPROLENE AF) 0.05 % cream; Apply  topically 2 (two) times daily.  Dispense: 30 g; Refill: 2  9. Generalized anxiety disorder - FLUoxetine (PROZAC) 40 MG capsule; TAKE 1 CAPSULE (40 MG TOTAL) BY MOUTH DAILY.  Dispense: 90 capsule; Refill: 3 - ALPRAZolam (XANAX) 0.5 MG tablet; Take 1 tablet (0.5 mg total) by mouth at bedtime as needed for anxiety.  Dispense: 30 tablet; Refill: 0   Mercer Pod, PA-C  [facility] at [facility] 12/10/2017 8:47 AM

## 2017-12-10 NOTE — Patient Instructions (Addendum)
You need to schedule you mammogram.  Please call the breast center at [facility] at [phone] to schedule.   Call [facility]. [phone] to schedule an appointment to evaluate menopausal status.  [address], [place], [place] will receive a phone call to schedule an appointment for your colonoscopy.   Health Maintenance, Female Adopting a healthy lifestyle and getting preventive care can go a long way to promote health and wellness. Talk with your health care provider about what schedule of regular examinations is right for you. This is a good chance for you to check in with your provider about disease prevention and staying healthy. In between checkups, there are plenty of things you can do on your own. Experts have done a lot of research about which lifestyle changes and preventive measures are most likely to keep you healthy. Ask your health care provider for more information. Weight and diet Eat a healthy diet  Be sure to include plenty of vegetables, fruits, low-fat dairy products, and lean protein.  Do not eat a lot of foods high in solid fats, added sugars, or salt.  Get regular exercise. This is one of the most important things you can do for your health. ? Most adults should exercise for at least 150 minutes each week. The exercise should increase your heart rate and make you sweat (moderate-intensity exercise). ? Most adults should also do strengthening exercises at least twice a week. This is in addition to the moderate-intensity exercise.  Maintain a healthy weight  Body mass index (BMI) is a measurement that can be used to identify possible weight problems. It estimates body fat based on height and weight. Your health care provider can help determine your BMI and help you achieve or maintain a healthy weight.  For females 54 years of age and older: ? A BMI below 18.5 is considered underweight. ? A BMI of 18.5 to 24.9 is  normal. ? A BMI of 25 to 29.9 is considered overweight. ? A BMI of 30 and above is considered obese.  Watch levels of cholesterol and blood lipids  You should start having your blood tested for lipids and cholesterol at 56 years of age, then have this test every 5 years.  You may need to have your cholesterol levels checked more often if: ? Your lipid or cholesterol levels are high. ? You are older than 56 years of age. ? You are at high risk for heart disease.  Cancer screening Lung Cancer  Lung cancer screening is recommended for adults 37-65 years old who are at high risk for lung cancer because of a history of smoking.  A yearly low-dose CT scan of the lungs is recommended for people who: ? Currently smoke. ? Have quit within the past 15 years. ? Have at least a 30-pack-year history of smoking. A pack year is smoking an average of one pack of cigarettes a day for 1 year.  Yearly screening should continue until it has been 15 years since you quit.  Yearly screening should stop if you develop a health problem that would prevent you from having lung cancer treatment.  Breast Cancer  Practice breast self-awareness. This means understanding how your breasts normally appear and feel.  It also means doing regular breast self-exams. Let your health care provider know about any changes, no matter how small.  If you are in your 20s or 30s, you should have a clinical breast exam (CBE) by  a health care provider every 1-3 years as part of a regular health exam.  If you are 42 or older, have a CBE every year. Also consider having a breast X-ray (mammogram) every year.  If you have a family history of breast cancer, talk to your health care provider about genetic screening.  If you are at high risk for breast cancer, talk to your health care provider about having an MRI and a mammogram every year.  Breast cancer gene (BRCA) assessment is recommended for women who have family members  with BRCA-related cancers. BRCA-related cancers include: ? Breast. ? Ovarian. ? Tubal. ? Peritoneal cancers.  Results of the assessment will determine the need for genetic counseling and BRCA1 and BRCA2 testing.  Cervical Cancer Your health care provider may recommend that you be screened regularly for cancer of the pelvic organs (ovaries, uterus, and vagina). This screening involves a pelvic examination, including checking for microscopic changes to the surface of your cervix (Pap test). You may be encouraged to have this screening done every 3 years, beginning at age 29.  For women ages 29-65, health care providers may recommend pelvic exams and Pap testing every 3 years, or they may recommend the Pap and pelvic exam, combined with testing for human papilloma virus (HPV), every 5 years. Some types of HPV increase your risk of cervical cancer. Testing for HPV may also be done on women of any age with unclear Pap test results.  Other health care providers may not recommend any screening for nonpregnant women who are considered low risk for pelvic cancer and who do not have symptoms. Ask your health care provider if a screening pelvic exam is right for you.  If you have had past treatment for cervical cancer or a condition that could lead to cancer, you need Pap tests and screening for cancer for at least 20 years after your treatment. If Pap tests have been discontinued, your risk factors (such as having a new sexual partner) need to be reassessed to determine if screening should resume. Some women have medical problems that increase the chance of getting cervical cancer. In these cases, your health care provider may recommend more frequent screening and Pap tests.  Colorectal Cancer  This type of cancer can be detected and often prevented.  Routine colorectal cancer screening usually begins at 56 years of age and continues through 56 years of age.  Your health care provider may recommend  screening at an earlier age if you have risk factors for colon cancer.  Your health care provider may also recommend using home test kits to check for hidden blood in the stool.  A small camera at the end of a tube can be used to examine your colon directly (sigmoidoscopy or colonoscopy). This is done to check for the earliest forms of colorectal cancer.  Routine screening usually begins at age 62.  Direct examination of the colon should be repeated every 5-10 years through 56 years of age. However, you may need to be screened more often if early forms of precancerous polyps or small growths are found.  Skin Cancer  Check your skin from head to toe regularly.  Tell your health care provider about any new moles or changes in moles, especially if there is a change in a mole's shape or color.  Also tell your health care provider if you have a mole that is larger than the size of a pencil eraser.  Always use sunscreen. Apply sunscreen liberally and repeatedly  throughout the day.  Protect yourself by wearing long sleeves, pants, a wide-brimmed hat, and sunglasses whenever you are outside.  Heart disease, diabetes, and high blood pressure  High blood pressure causes heart disease and increases the risk of stroke. High blood pressure is more likely to develop in: ? People who have blood pressure in the high end of the normal range (130-139/85-89 mm Hg). ? People who are overweight or obese. ? People who are African American.  If you are 19-35 years of age, have your blood pressure checked every 3-5 years. If you are 8 years of age or older, have your blood pressure checked every year. You should have your blood pressure measured twice-once when you are at a hospital or clinic, and once when you are not at a hospital or clinic. Record the average of the two measurements. To check your blood pressure when you are not at a hospital or clinic, you can use: ? An automated blood pressure machine at  a pharmacy. ? A home blood pressure monitor.  If you are between 31 years and 80 years old, ask your health care provider if you should take aspirin to prevent strokes.  Have regular diabetes screenings. This involves taking a blood sample to check your fasting blood sugar level. ? If you are at a normal weight and have a low risk for diabetes, have this test once every three years after 56 years of age. ? If you are overweight and have a high risk for diabetes, consider being tested at a younger age or more often. Preventing infection Hepatitis B  If you have a higher risk for hepatitis B, you should be screened for this virus. You are considered at high risk for hepatitis B if: ? You were born in a country where hepatitis B is common. Ask your health care provider which countries are considered high risk. ? Your parents were born in a high-risk country, and you have not been immunized against hepatitis B (hepatitis B vaccine). ? You have HIV or AIDS. ? You use needles to inject street drugs. ? You live with someone who has hepatitis B. ? You have had sex with someone who has hepatitis B. ? You get hemodialysis treatment. ? You take certain medicines for conditions, including cancer, organ transplantation, and autoimmune conditions.  Hepatitis C  Blood testing is recommended for: ? Everyone born from [birth year] through 1964. ? Anyone with known risk factors for hepatitis C.  Sexually transmitted infections (STIs)  You should be screened for sexually transmitted infections (STIs) including gonorrhea and chlamydia if: ? You are sexually active and are younger than 56 years of age. ? You are older than 56 years of age and your health care provider tells you that you are at risk for this type of infection. ? Your sexual activity has changed since you were last screened and you are at an increased risk for chlamydia or gonorrhea. Ask your health care provider if you are at risk.  If you do  not have HIV, but are at risk, it may be recommended that you take a prescription medicine daily to prevent HIV infection. This is called pre-exposure prophylaxis (PrEP). You are considered at risk if: ? You are sexually active and do not regularly use condoms or know the HIV status of your partner(s). ? You take drugs by injection. ? You are sexually active with a partner who has HIV.  Talk with your health care provider about whether you are  at high risk of being infected with HIV. If you choose to begin PrEP, you should first be tested for HIV. You should then be tested every 3 months for as long as you are taking PrEP. Pregnancy  If you are premenopausal and you may become pregnant, ask your health care provider about preconception counseling.  If you may become pregnant, take 400 to 800 micrograms (mcg) of folic acid every day.  If you want to prevent pregnancy, talk to your health care provider about birth control (contraception). Osteoporosis and menopause  Osteoporosis is a disease in which the bones lose minerals and strength with aging. This can result in serious bone fractures. Your risk for osteoporosis can be identified using a bone density scan.  If you are 7 years of age or older, or if you are at risk for osteoporosis and fractures, ask your health care provider if you should be screened.  Ask your health care provider whether you should take a calcium or vitamin D supplement to lower your risk for osteoporosis.  Menopause may have certain physical symptoms and risks.  Hormone replacement therapy may reduce some of these symptoms and risks. Talk to your health care provider about whether hormone replacement therapy is right for you. Follow these instructions at home:  Schedule regular health, dental, and eye exams.  Stay current with your immunizations.  Do not use any tobacco products including cigarettes, chewing tobacco, or electronic cigarettes.  If you are  pregnant, do not drink alcohol.  If you are breastfeeding, limit how much and how often you drink alcohol.  Limit alcohol intake to no more than 1 drink per day for nonpregnant women. One drink equals 12 ounces of beer, 5 ounces of wine, or 1 ounces of hard liquor.  Do not use street drugs.  Do not share needles.  Ask your health care provider for help if you need support or information about quitting drugs.  Tell your health care provider if you often feel depressed.  Tell your health care provider if you have ever been abused or do not feel safe at home. This information is not intended to replace advice given to you by your health care provider. Make sure you discuss any questions you have with your health care provider. Document Released: 06/03/2011 Document Revised: 04/25/2016 Document Reviewed: 08/22/2015 Elsevier Interactive Patient Education  2018 [employer].  IF you received an x-ray today, you will receive an invoice from [facility]. Please contact [facility] at [phone] with questions or concerns regarding your invoice.   IF you received labwork today, you will receive an invoice from [facility]. Please contact [facility] at [phone] with questions or concerns regarding your invoice.   Our billing staff will not be able to assist you with questions regarding bills from these companies.  You will be contacted with the lab results as soon as they are available. The fastest way to get your results is to activate your My Chart account. Instructions are located on the last page of this paperwork. If you have not heard from [place] regarding the results in 2 weeks, please contact this office.

## 2017-12-11 LAB — CBC WITH DIFFERENTIAL/PLATELET
Basophils Absolute: 0 10*3/uL (ref 0.0–0.2)
Basos: 1 %
EOS (ABSOLUTE): 0.2 10*3/uL (ref 0.0–0.4)
Eos: 3 %
Hematocrit: 38.7 % (ref 34.0–46.6)
Hemoglobin: 13 g/dL (ref 11.1–15.9)
Immature Grans (Abs): 0 10*3/uL (ref 0.0–0.1)
Immature Granulocytes: 0 %
Lymphocytes Absolute: 2.4 10*3/uL (ref 0.7–3.1)
Lymphs: 50 %
MCH: 31.3 pg (ref 26.6–33.0)
MCHC: 33.6 g/dL (ref 31.5–35.7)
MCV: 93 fL (ref 79–97)
Monocytes Absolute: 0.4 10*3/uL (ref 0.1–0.9)
Monocytes: 9 %
Neutrophils Absolute: 1.8 10*3/uL (ref 1.4–7.0)
Neutrophils: 37 %
Platelets: 183 10*3/uL (ref 150–379)
RBC: 4.15 x10E6/uL (ref 3.77–5.28)
RDW: 13.9 % (ref 12.3–15.4)
WBC: 4.8 10*3/uL (ref 3.4–10.8)

## 2017-12-11 LAB — LIPID PANEL
Chol/HDL Ratio: 3.8 ratio (ref 0.0–4.4)
Cholesterol, Total: 261 mg/dL — ABNORMAL HIGH (ref 100–199)
HDL: 68 mg/dL (ref >39)
LDL Calculated: 165 mg/dL — ABNORMAL HIGH (ref 0–99)
Triglycerides: 141 mg/dL (ref 0–149)
VLDL Cholesterol Cal: 28 mg/dL (ref 5–40)

## 2017-12-11 LAB — CMP14+EGFR
ALT: 26 IU/L (ref 0–32)
AST: 37 IU/L (ref 0–40)
Albumin/Globulin Ratio: 1.4 (ref 1.2–2.2)
Albumin: 4.1 g/dL (ref 3.5–5.5)
Alkaline Phosphatase: 105 IU/L (ref 39–117)
BUN/Creatinine Ratio: 17 (ref 9–23)
BUN: 8 mg/dL (ref 6–24)
Bilirubin Total: 0.2 mg/dL (ref 0.0–1.2)
CO2: 27 mmol/L (ref 20–29)
Calcium: 8.7 mg/dL (ref 8.7–10.2)
Chloride: 103 mmol/L (ref 96–106)
Creatinine, Ser: 0.47 mg/dL — ABNORMAL LOW (ref 0.57–1.00)
GFR calc Af Amer: 129 mL/min/{1.73_m2} (ref >59)
GFR calc non Af Amer: 112 mL/min/{1.73_m2} (ref >59)
Globulin, Total: 3 g/dL (ref 1.5–4.5)
Glucose: 97 mg/dL (ref 65–99)
Potassium: 3.7 mmol/L (ref 3.5–5.2)
Sodium: 144 mmol/L (ref 134–144)
Total Protein: 7.1 g/dL (ref 6.0–8.5)

## 2017-12-11 LAB — VITAMIN D 25 HYDROXY (VIT D DEFICIENCY, FRACTURES): Vit D, 25-Hydroxy: 9.8 ng/mL — ABNORMAL LOW (ref 30.0–100.0)

## 2017-12-15 NOTE — Progress Notes (Signed)
Your vitamin D level is very low. Please start a Vitamin D supplement of 800 to 1,000 international units daily. You should also maintain a daily total calcium intake (diet plus supplement) of 1,200 mg.  Come back and see Victoria Osborne in 3 months for recheck vitamin D level.  This information plus the rest of your lab results (which look good) have been released to your MyChart.

## 2017-12-16 ENCOUNTER — Encounter: Payer: Self-pay | Admitting: Radiology

## 2018-01-15 ENCOUNTER — Encounter: Payer: Self-pay | Admitting: Physician Assistant

## 2018-01-22 ENCOUNTER — Other Ambulatory Visit: Payer: Self-pay | Admitting: Physician Assistant

## 2018-01-22 DIAGNOSIS — F411 Generalized anxiety disorder: Secondary | ICD-10-CM

## 2018-01-23 NOTE — Telephone Encounter (Signed)
Alprazolam refill Last OV: 12/10/17 Last Refill:12/10/17 Pharmacy: [place], [place] Provider: Mercer Pod

## 2018-03-10 ENCOUNTER — Ambulatory Visit: Payer: Self-pay

## 2018-03-10 NOTE — Telephone Encounter (Signed)
Pt. Reports she has increased shortness of breath for "about a year - but it has gotten worse recently." Has shortness of breath at rest and with exertion. Denies any chest pain. Appointment scheduled. Reason for Disposition . [1] MODERATE longstanding difficulty breathing (e.g., speaks in phrases, SOB even at rest, pulse 100-120) AND [2] SAME as normal  Answer Assessment - Initial Assessment Questions 1. RESPIRATORY STATUS: "Describe your breathing?" (e.g., wheezing, shortness of breath, unable to speak, severe coughing)      Shortness of breath with exertion and at rest x 1 year 2. ONSET: "When did this breathing problem begin?"      1 year ago 3. PATTERN "Does the difficult breathing come and go, or has it been constant since it started?"      Gotten worse, but comes and goes 4. SEVERITY: "How bad is your breathing?" (e.g., mild, moderate, severe)    - MILD: No SOB at rest, mild SOB with walking, speaks normally in sentences, can lay down, no retractions, pulse < 100.    - MODERATE: SOB at rest, SOB with minimal exertion and prefers to sit, cannot lie down flat, speaks in phrases, mild retractions, audible wheezing, pulse 100-120.    - SEVERE: Very SOB at rest, speaks in single words, struggling to breathe, sitting hunched forward, retractions, pulse > 120      Mild 5. RECURRENT SYMPTOM: "Have you had difficulty breathing before?" If so, ask: "When was the last time?" and "What happened that time?"      No 6. CARDIAC HISTORY: "Do you have any history of heart disease?" (e.g., heart attack, angina, bypass surgery, angioplasty)      No 7. LUNG HISTORY: "Do you have any history of lung disease?"  (e.g., pulmonary embolus, asthma, emphysema)     No 8. CAUSE: "What do you think is causing the breathing problem?"      Unsure 9. OTHER SYMPTOMS: "Do you have any other symptoms? (e.g., dizziness, runny nose, cough, chest pain, fever)     Sometimes chest pain with exercise 10. PREGNANCY: "Is there  any chance you are pregnant?" "When was your last menstrual period?"       no 11. TRAVEL: "Have you traveled out of the country in the last month?" (e.g., travel history, exposures)       No  Protocols used: BREATHING DIFFICULTY-A-AH

## 2018-03-11 ENCOUNTER — Telehealth: Payer: Self-pay | Admitting: Physician Assistant

## 2018-03-11 ENCOUNTER — Other Ambulatory Visit: Payer: Self-pay

## 2018-03-11 ENCOUNTER — Encounter: Payer: Self-pay | Admitting: Physician Assistant

## 2018-03-11 ENCOUNTER — Ambulatory Visit: Payer: BLUE CROSS/BLUE SHIELD | Admitting: Physician Assistant

## 2018-03-11 ENCOUNTER — Ambulatory Visit (INDEPENDENT_AMBULATORY_CARE_PROVIDER_SITE_OTHER): Payer: BLUE CROSS/BLUE SHIELD

## 2018-03-11 ENCOUNTER — Telehealth: Payer: Self-pay | Admitting: Emergency Medicine

## 2018-03-11 VITALS — BP 108/75 | HR 95 | Temp 98.4°F | Resp 16 | Ht 67.0 in | Wt 152.4 lb

## 2018-03-11 DIAGNOSIS — R110 Nausea: Secondary | ICD-10-CM

## 2018-03-11 DIAGNOSIS — F411 Generalized anxiety disorder: Secondary | ICD-10-CM

## 2018-03-11 DIAGNOSIS — R911 Solitary pulmonary nodule: Secondary | ICD-10-CM

## 2018-03-11 DIAGNOSIS — E559 Vitamin D deficiency, unspecified: Secondary | ICD-10-CM | POA: Diagnosis not present

## 2018-03-11 DIAGNOSIS — R0602 Shortness of breath: Secondary | ICD-10-CM | POA: Diagnosis not present

## 2018-03-11 DIAGNOSIS — R9431 Abnormal electrocardiogram [ECG] [EKG]: Secondary | ICD-10-CM

## 2018-03-11 LAB — POCT CBC
Granulocyte percent: 70 %G (ref 37–80)
HCT, POC: 41.7 % (ref 37.7–47.9)
Hemoglobin: 13.5 g/dL (ref 12.2–16.2)
Lymph, poc: 1.6 (ref 0.6–3.4)
MCH, POC: 32.2 pg — AB (ref 27–31.2)
MCHC: 32.5 g/dL (ref 31.8–35.4)
MCV: 99.3 fL — AB (ref 80–97)
MID (cbc): 0.2 (ref 0–0.9)
MPV: 8.7 fL (ref 0–99.8)
POC Granulocyte: 4.3 (ref 2–6.9)
POC LYMPH PERCENT: 26.2 % (ref 10–50)
POC MID %: 3.8 % (ref 0–12)
Platelet Count, POC: 127 10*3/uL — AB (ref 142–424)
RBC: 4.2 M/uL (ref 4.04–5.48)
RDW, POC: 12.6 %
WBC: 6.2 10*3/uL (ref 4.6–10.2)

## 2018-03-11 MED ORDER — ONDANSETRON 4 MG PO TBDP: 8.0000 mg | ORAL_TABLET | Freq: Once | ORAL | Status: DC

## 2018-03-11 MED ORDER — FLUOXETINE HCL 20 MG PO TABS: 60.0000 mg | ORAL_TABLET | Freq: Every day | ORAL | 3 refills | Status: DC

## 2018-03-11 MED ORDER — GI COCKTAIL ~~LOC~~
30.0000 mL | Freq: Once | ORAL | Status: AC
  Administered 2018-03-11: 30 mL via ORAL

## 2018-03-11 NOTE — Progress Notes (Signed)
Victoria Osborne  MRN: [id number] DOB: 03-Jan-1961  PCP: Darlyne Russian, MD  Subjective:  Pt is a 57 year old female PMH depression and alcohol dependence who presents to clinic for several complaints.  Shob started about 1 year ago. Worse with "normal" exertion, like walking around or going up steps.   Fatigue, nausea, vomiting x 2 weeks. She vomits a few times a week. No known triggers. Not associated with food. Chest pain - "dull pain" center of chest. Comes and goes x 1 year. Nothing makes it worse. Wine makes it better.  Reflux symptoms x 1 year. Comes and goes.  "it all happens at once" - all the symptoms come on at the same time.   Denies abdominal pain, neck pain, left arm pain, palpitations, HA, pain with inspiration, cough, diaphoresis.   Anxiety and depression - fluoxetine 16m qd and Xanax 0.539mPRN. She endorses increased life stressors. She and her boyfriend are breaking up. She has to move out of his house. She admits to a history of episodic vomiting after increased interpersonal conflicts. She understands her symptoms are possibly stress related.   Review of Systems  Constitutional: Positive for appetite change ("none"), diaphoresis and fatigue. Negative for chills and fever.  Respiratory: Positive for shortness of breath. Negative for cough, chest tightness and wheezing.   Cardiovascular: Positive for chest pain. Negative for palpitations and leg swelling.  Gastrointestinal: Positive for nausea and vomiting. Negative for abdominal pain and diarrhea.  Psychiatric/Behavioral: Positive for dysphoric mood. Negative for self-injury and suicidal ideas. The patient is nervous/anxious.     Patient Active Problem List   Diagnosis Date Noted  . History of alcohol dependence ([facility]05/18/2014  . Depression 04/01/2013    Current Outpatient Medications on File Prior to Visit  Medication Sig Dispense Refill  . ALPRAZolam (XANAX) 0.5 MG tablet TAKE 1 TABLET BY MOUTH AT BEDTIME  AS NEEDED FOR ANXIETY 30 tablet 0  . augmented betamethasone dipropionate (DIPROLENE AF) 0.05 % cream Apply topically 2 (two) times daily. 30 g 2  . FLUoxetine (PROZAC) 40 MG capsule TAKE 1 CAPSULE (40 MG TOTAL) BY MOUTH DAILY. 90 capsule 3   No current facility-administered medications on file prior to visit.     No Known Allergies   Objective:  BP 108/75   Pulse 95   Temp 98.4 F (36.9 C) (Oral)   Resp 16   Ht _0  (1.702 m)   Wt 152 lb 6.4 oz (69.1 kg)   LMP 03/08/2018   SpO2 99%   PF 360 L/min   BMI 23.87 kg/m   Physical Exam  Constitutional: She is oriented to person, place, and time. No distress.  Cardiovascular: Normal rate, regular rhythm and normal heart sounds.  Abdominal: Soft. Normal appearance and bowel sounds are normal. There is tenderness in the epigastric area. There is no rigidity and no guarding.  Neurological: She is alert and oriented to person, place, and time.  Skin: Skin is warm and dry.  Psychiatric: Judgment normal.  Vitals reviewed.   Dg Chest 2 View  Result Date: 03/11/2018 CLINICAL DATA:  Shortness of breath and nausea. History of alcohol dependence, former smoker. EXAM: CHEST - 2 VIEW COMPARISON:  None in PACs FINDINGS: The lungs are subjectively adequately mineralized. There is a 6 x 9 by 6 mm nodule that projects in the left upper lobe between the posterior aspects of the fourth and fifth ribs and overlies the anterior aspect of the second rib. There is no  alveolar infiltrate or pleural effusion. The heart and pulmonary vascularity are normal. The mediastinum is normal in width. There is calcification in the wall of the aortic arch. The bony thorax exhibits no acute abnormality. IMPRESSION: No pneumonia nor CHF. Subtle soft tissue nodularity projecting in the left upper lobe. Chest CT scanning is recommended to exclude occult malignancy. These results will be called to the ordering clinician or representative by the Radiologist Assistant, and  communication documented in the PACS or zVision Dashboard. Electronically Signed   By: David  Martinique M.D.   On: 03/11/2018 16:18   EKG - incomplete RBBB, sinus rhythm Assessment and Plan :  1. Nausea without vomiting - POCT CBC - TSH - gi cocktail (Maalox,Lidocaine,Donnatal) - CMP14+EGFR - DG Chest 2 View; Future - EKG 12-Lead - Pt presents with an interesting constellation of symptoms. She is a wandering historian. Unsure how much her anxiety plays into her symptoms - she was nauseous and concerned about vomiting at the beginning of our encounter and agreed to Zofran. She completely improved after 5-10 minutes of talking stating "I guess I just forgot about it". Plan to increase Prozac to 3m qd. Nodule discovered on chest x-ray - plan to get a CT for further imaging. Plan to refer to cardiology for possible incomplete RBBB on EKG along with HPI of worsening shob with exertion and chest pain. She understands and agrees with plan. RTC in 1 month for f/u.   2. Solitary pulmonary nodule - CT Chest Wo Contrast; Future  3. Nonspecific abnormal electrocardiogram (ECG) (EKG) 4. Short of breath on exertion - Ambulatory referral to Cardiology - EKG 12-Lead 5. Generalized anxiety disorder - FLUoxetine (PROZAC) 20 MG tablet; Take 3 tablets (60 mg total) by mouth daily.  Dispense: 90 tablet; Refill: 3  6. Vitamin D deficiency - VITAMIN D 25 Hydroxy (Vit-D Deficiency, Fractures) - Found at annual exam 3 months ago and asked to f/u in 3 months for recheck. Lab is pending.   WMercer Pod PA-C  [facility]4/09/2018 3:30 PM

## 2018-03-11 NOTE — Telephone Encounter (Signed)
Call report from Opal Sidles at [facility]. CXR: No pneumonia or CHF. Subtle soft tissue nodularity in left upper lobe, recommend chest CT to exclude occult malignancy.  Called report to [facility].

## 2018-03-11 NOTE — Patient Instructions (Addendum)
  Start taking Prozac '60mg'$  daily.  Come back and see me in 4-6 weeks for recheck. Your chest x-ray showed a nodule of your lung - I recommend you get a CT scan to look into this in more detail. Your EKG shows a possible abnormality. I am sending you to cardiology for evaluation.  You will receive a phone call to schedule an appointment with cardiology and the CT scan.   Please repeat your PAP next month.    Thank you for coming in today. I hope you feel we met your needs.  Feel free to call PCP if you have any questions or further requests.  Please consider signing up for MyChart if you do not already have it, as this is a great way to communicate with me.  Best,  Whitney McVey, PA-C  IF you received an x-ray today, you will receive an invoice from [facility]. Please contact [facility] at [phone] with questions or concerns regarding your invoice.   IF you received labwork today, you will receive an invoice from [place]. Please contact [facility] at [phone] with questions or concerns regarding your invoice.   Our billing staff will not be able to assist you with questions regarding bills from these companies.  You will be contacted with the lab results as soon as they are available. The fastest way to get your results is to activate your My Chart account. Instructions are located on the last page of this paperwork. If you have not heard from [place] regarding the results in 2 weeks, please contact this office.

## 2018-03-11 NOTE — Telephone Encounter (Signed)
Incoming call from [place] at [facility]  Chest xray shows no pneumonia or congestive heart failure. Subtle soft tissue nodularity in left upper lobe visualized. Recommend chest CT to exclude occult malignancy.   Full report for xray available.

## 2018-03-12 LAB — CMP14+EGFR
ALT: 138 IU/L — ABNORMAL HIGH (ref 0–32)
AST: 198 IU/L — ABNORMAL HIGH (ref 0–40)
Albumin/Globulin Ratio: 1.5 (ref 1.2–2.2)
Albumin: 4.6 g/dL (ref 3.5–5.5)
Alkaline Phosphatase: 100 IU/L (ref 39–117)
BUN/Creatinine Ratio: 26 — ABNORMAL HIGH (ref 9–23)
BUN: 18 mg/dL (ref 6–24)
Bilirubin Total: 1.7 mg/dL — ABNORMAL HIGH (ref 0.0–1.2)
CO2: 23 mmol/L (ref 20–29)
Calcium: 10.1 mg/dL (ref 8.7–10.2)
Chloride: 91 mmol/L — ABNORMAL LOW (ref 96–106)
Creatinine, Ser: 0.7 mg/dL (ref 0.57–1.00)
GFR calc Af Amer: 113 mL/min/{1.73_m2} (ref >59)
GFR calc non Af Amer: 98 mL/min/{1.73_m2} (ref >59)
Globulin, Total: 3 g/dL (ref 1.5–4.5)
Glucose: 102 mg/dL — ABNORMAL HIGH (ref 65–99)
Potassium: 2.9 mmol/L — ABNORMAL LOW (ref 3.5–5.2)
Sodium: 136 mmol/L (ref 134–144)
Total Protein: 7.6 g/dL (ref 6.0–8.5)

## 2018-03-12 LAB — TSH: TSH: 1.63 u[IU]/mL (ref 0.450–4.500)

## 2018-03-12 LAB — VITAMIN D 25 HYDROXY (VIT D DEFICIENCY, FRACTURES): Vit D, 25-Hydroxy: 6.8 ng/mL — ABNORMAL LOW (ref 30.0–100.0)

## 2018-03-13 ENCOUNTER — Other Ambulatory Visit: Payer: Self-pay | Admitting: Physician Assistant

## 2018-03-13 DIAGNOSIS — E559 Vitamin D deficiency, unspecified: Secondary | ICD-10-CM

## 2018-03-13 DIAGNOSIS — F411 Generalized anxiety disorder: Secondary | ICD-10-CM

## 2018-03-13 DIAGNOSIS — R748 Abnormal levels of other serum enzymes: Secondary | ICD-10-CM

## 2018-03-13 MED ORDER — VITAMIN D3 10 MCG (400 UNIT) PO CAPS: 800.0000 [IU] | ORAL_CAPSULE | Freq: Every day | ORAL | 0 refills | Status: DC

## 2018-03-13 MED ORDER — VITAMIN D (ERGOCALCIFEROL) 1.25 MG (50000 UNIT) PO CAPS: 50000.0000 [IU] | ORAL_CAPSULE | ORAL | 0 refills | Status: DC

## 2018-03-13 NOTE — Progress Notes (Signed)
Please call pt: 1) Her vitamin D level is very low. She needs to start a vitamin D and calcium supplement. Check her mychart account for more details.  2) Please have her come back in 1-2 weeks to recheck her CMP.

## 2018-03-17 ENCOUNTER — Ambulatory Visit (INDEPENDENT_AMBULATORY_CARE_PROVIDER_SITE_OTHER): Payer: BLUE CROSS/BLUE SHIELD | Admitting: Cardiology

## 2018-03-17 ENCOUNTER — Encounter: Payer: Self-pay | Admitting: Cardiology

## 2018-03-17 VITALS — BP 110/66 | HR 84 | Ht 67.0 in | Wt 157.0 lb

## 2018-03-17 DIAGNOSIS — F329 Major depressive disorder, single episode, unspecified: Secondary | ICD-10-CM

## 2018-03-17 DIAGNOSIS — R9431 Abnormal electrocardiogram [ECG] [EKG]: Secondary | ICD-10-CM | POA: Diagnosis not present

## 2018-03-17 DIAGNOSIS — F1021 Alcohol dependence, in remission: Secondary | ICD-10-CM | POA: Diagnosis not present

## 2018-03-17 DIAGNOSIS — F32A Depression, unspecified: Secondary | ICD-10-CM

## 2018-03-17 DIAGNOSIS — R0789 Other chest pain: Secondary | ICD-10-CM | POA: Insufficient documentation

## 2018-03-17 NOTE — Addendum Note (Signed)
Addended by: Austin Miles on: 03/17/2018 02:14 PM   Modules accepted: Orders

## 2018-03-17 NOTE — Progress Notes (Signed)
Cardiology Consultation:    Date:  03/17/2018   ID:  Victoria Osborne, DOB 22-Aug-1961, MRN [id number]  PCP:  Darlyne Russian, MD  Cardiologist:  Jenne Campus, MD   Referring MD: Desiree Lucy*   Chief Complaint  Patient presents with  . Abnormal ECG  Abnormal EKG and chest pain  History of Present Illness:    Victoria Osborne is a 57 y.o. female who is being seen today for the evaluation of normal EKG at the request of McVey, Letta Median*.  She does have history of depression for last few weeks she is not doing well she had difficulty getting up in the morning from her breath.  She works for [employer] and last 2 weeks there was a Landscape architect in town she was not able to get up in the morning and go to work now she regrets this because she knew she could learn a lot of monitoring that.  Finally she decided to go to her primary care physician and a lot of blood tests were done.  She also had increased dose of her Prozac however she started taking higher dose just about 10 days ago.  Cannot tell any difference right now.  She did have EKG done EKG showed incomplete right bundle branch block with nonspecific ST segment changes.  She complained of having chest pain chest pain is located in the right side not worse with taking deep breaths she tells me that drinking wine helps with the pain pain is continue lasting 4 days she grades the pain as 1-2 in scale up to 10.  She said when she walks pain can get slightly worse to may be about 3 there is no shortness of breath no sweating associated with this sensation. Risk factors for coronary artery disease include dyslipidemia.  She does have remote history of smoking however quit smoking many years ago.  Does not exercise on the regular basis.  Her work however required a lot of physical work and she does not have any difficulty doing it  Past Medical History:  Diagnosis Date  . Anxiety   . Depression   . Substance abuse  [facility])     Past Surgical History:  Procedure Laterality Date  . CESAREAN SECTION      Current Medications: Current Meds  Medication Sig  . ALPRAZolam (XANAX) 0.5 MG tablet TAKE 1 TABLET BY MOUTH AT BEDTIME AS NEEDED FOR ANXIETY  . augmented betamethasone dipropionate (DIPROLENE AF) 0.05 % cream Apply topically 2 (two) times daily.  Marland Kitchen FLUoxetine (PROZAC) 20 MG tablet Take 3 tablets (60 mg total) by mouth daily.  Marland Kitchen FLUoxetine (PROZAC) 40 MG capsule TAKE 1 CAPSULE (40 MG TOTAL) BY MOUTH DAILY.     Allergies:   Patient has no known allergies.   Social History   Socioeconomic History  . Marital status: Married    Spouse name: Not on file  . Number of children: Not on file  . Years of education: Not on file  . Highest education level: Not on file  Occupational History  . Occupation: Pharmacist, hospital  Social Needs  . Financial resource strain: Not on file  . Food insecurity:    Worry: Not on file    Inability: Not on file  . Transportation needs:    Medical: Not on file    Non-medical: Not on file  Tobacco Use  . Smoking status: Former Research scientist (life sciences)  . Smokeless tobacco: Never Used  Substance and Sexual Activity  . Alcohol use:  Yes    Alcohol/week: 6.0 oz    Types: 10 Standard drinks or equivalent per week    Comment: Rare  . Drug use: No  . Sexual activity: Yes    Birth control/protection: Condom  Lifestyle  . Physical activity:    Days per week: Not on file    Minutes per session: Not on file  . Stress: Not on file  Relationships  . Social connections:    Talks on phone: Not on file    Gets together: Not on file    Attends religious service: Not on file    Active member of club or organization: Not on file    Attends meetings of clubs or organizations: Not on file    Relationship status: Not on file  Other Topics Concern  . Not on file  Social History Narrative   Significant other   Education: College      Exercise: Yes     Family History: The patient's family history  includes Depression in her mother; Diabetes in her sister. ROS:   Please see the history of present illness.    All 14 point review of systems negative except as described per history of present illness.  EKGs/Labs/Other Studies Reviewed:    The following studies were reviewed today: EKG showed normal sinus rhythm normal P interval incomplete right bundle branch block nonspecific ST segment changes    Recent Labs: 12/10/2017: Platelets 183 03/11/2018: ALT 138; BUN 18; Creatinine, Ser 0.70; Hemoglobin 13.5; Potassium 2.9; Sodium 136; TSH 1.630  Recent Lipid Panel    Component Value Date/Time   CHOL 261 (H) 12/10/2017 0935   TRIG 141 12/10/2017 0935   HDL 68 12/10/2017 0935   CHOLHDL 3.8 12/10/2017 0935   CHOLHDL 3.9 02/27/2016 0930   VLDL 21 02/27/2016 0930   LDLCALC 165 (H) 12/10/2017 0935    Physical Exam:    VS:  BP 110/66 (BP Location: Right Arm)   Pulse 84   Ht 5\' 7"  (1.702 m)   Wt 157 lb (71.2 kg)   LMP 03/08/2018   SpO2 96%   BMI 24.59 kg/m     Wt Readings from Last 3 Encounters:  03/17/18 157 lb (71.2 kg)  03/11/18 152 lb 6.4 oz (69.1 kg)  12/10/17 178 lb 3.2 oz (80.8 kg)     GEN:  Well nourished, well developed in no acute distress HEENT: Normal NECK: No JVD; No carotid bruits LYMPHATICS: No lymphadenopathy CARDIAC: RRR, no murmurs, no rubs, no gallops RESPIRATORY:  Clear to auscultation without rales, wheezing or rhonchi  ABDOMEN: Soft, right upper quadrant tenderness without rebound tenderness., non-distended MUSCULOSKELETAL:  No edema; No deformity  SKIN: Warm and dry NEUROLOGIC:  Alert and oriented x 3 PSYCHIATRIC:  Normal affect   ASSESSMENT:    1. Depression, unspecified depression type   2. Atypical chest pain   3. Abnormal EKG   4. History of alcohol dependence ([place])    PLAN:    In order of problems listed above:  1. Atypical chest pain.  I have a low level suspicion that this is related to her heart however I still think it would be  reasonable to perform stress test.  We will schedule her to have stress echocardiogram.  As a part of the fibrillation especially in view of the fact that she does have incomplete right bundle branch block I will ask you to have echocardiogram.  She told me that she is not snoring however if she truly have some  issue with the right side of her heart because of right bundle branch block which is incomplete we may do a sleep study. 2. Depression: I think it play significant role here.  Her medication recently was increased we would need to wait between 4 and 6 weeks before seeing any results of it.  She does not have any suicidal idealization. 3. History of EtOH abuse.  She said that she drinks 4-5 drinks every single night.  I told her this is a problem and she need to cut it down. 4. Interestingly on my physical exam she is tender in the right upper quadrant.  There is no rebound tenderness there is no guarding but clearly tender in that place.  She may require gallbladder ultrasound or HIDA scan in the future.   Medication Adjustments/Labs and Tests Ordered: Current medicines are reviewed at length with the patient today.  Concerns regarding medicines are outlined above.  No orders of the defined types were placed in this encounter.  No orders of the defined types were placed in this encounter.   Signed, Park Liter, MD, [facility]. 03/17/2018 2:01 PM    [facility]

## 2018-03-17 NOTE — Patient Instructions (Signed)
Medication Instructions:  Your physician recommends that you continue on your current medications as directed. Please refer to the Current Medication list given to you today.   Labwork: None  Testing/Procedures: Your physician has requested that you have an echocardiogram. Echocardiography is a painless test that uses sound waves to create images of your heart. It provides your doctor with information about the size and shape of your heart and how well your heart's chambers and valves are working. This procedure takes approximately one hour. There are no restrictions for this procedure.  Your physician has requested that you have a stress echocardiogram. For further information please visit [email or web address]. Please follow instruction sheet as given.   Follow-Up: Your physician recommends that you schedule a follow-up appointment in: 1 month.   Any Other Special Instructions Will Be Listed Below (If Applicable).     If you need a refill on your cardiac medications before your next appointment, please call your pharmacy.

## 2018-03-20 ENCOUNTER — Telehealth: Payer: Self-pay | Admitting: *Deleted

## 2018-03-20 NOTE — Telephone Encounter (Signed)
Pt advised regarding lab results and to f/u in 1-2 wks for labwork.

## 2018-03-27 ENCOUNTER — Other Ambulatory Visit: Payer: Self-pay

## 2018-03-27 ENCOUNTER — Encounter (HOSPITAL_COMMUNITY): Payer: Self-pay | Admitting: Emergency Medicine

## 2018-03-27 ENCOUNTER — Emergency Department (HOSPITAL_COMMUNITY)
Admission: EM | Admit: 2018-03-27 | Discharge: 2018-03-28 | Disposition: A | Discharge status: Home / Self Care | Payer: BLUE CROSS/BLUE SHIELD | Attending: Emergency Medicine | Admitting: Emergency Medicine

## 2018-03-27 DIAGNOSIS — R109 Unspecified abdominal pain: Secondary | ICD-10-CM | POA: Diagnosis not present

## 2018-03-27 DIAGNOSIS — Z5321 Procedure and treatment not carried out due to patient leaving prior to being seen by health care provider: Secondary | ICD-10-CM | POA: Diagnosis not present

## 2018-03-27 LAB — URINALYSIS, ROUTINE W REFLEX MICROSCOPIC
BACTERIA UA: NONE SEEN
Bilirubin Urine: NEGATIVE
Glucose, UA: NEGATIVE mg/dL
KETONES UR: 20 mg/dL — AB
LEUKOCYTES UA: NEGATIVE
NITRITE: NEGATIVE
Protein, ur: 30 mg/dL — AB
SPECIFIC GRAVITY, URINE: 1.023 (ref 1.005–1.030)
pH: 5 (ref 5.0–8.0)

## 2018-03-27 LAB — CBC
HEMATOCRIT: 39.8 % (ref 36.0–46.0)
HEMOGLOBIN: 13.6 g/dL (ref 12.0–15.0)
MCH: 33 pg (ref 26.0–34.0)
MCHC: 34.2 g/dL (ref 30.0–36.0)
MCV: 96.6 fL (ref 78.0–100.0)
Platelets: 143 10*3/uL — ABNORMAL LOW (ref 150–400)
RBC: 4.12 MIL/uL (ref 3.87–5.11)
RDW: 12.9 % (ref 11.5–15.5)
WBC: 3.9 10*3/uL — AB (ref 4.0–10.5)

## 2018-03-27 LAB — COMPREHENSIVE METABOLIC PANEL
ALT: 36 U/L (ref 14–54)
AST: 89 U/L — ABNORMAL HIGH (ref 15–41)
Albumin: 4 g/dL (ref 3.5–5.0)
Alkaline Phosphatase: 105 U/L (ref 38–126)
Anion gap: 18 — ABNORMAL HIGH (ref 5–15)
BILIRUBIN TOTAL: 1 mg/dL (ref 0.3–1.2)
BUN: 8 mg/dL (ref 6–20)
CHLORIDE: 102 mmol/L (ref 101–111)
CO2: 21 mmol/L — ABNORMAL LOW (ref 22–32)
Calcium: 8.7 mg/dL — ABNORMAL LOW (ref 8.9–10.3)
Creatinine, Ser: 0.52 mg/dL (ref 0.44–1.00)
Glucose, Bld: 85 mg/dL (ref 65–99)
POTASSIUM: 3.7 mmol/L (ref 3.5–5.1)
Sodium: 141 mmol/L (ref 135–145)
TOTAL PROTEIN: 7.3 g/dL (ref 6.5–8.1)

## 2018-03-27 LAB — I-STAT BETA HCG BLOOD, ED (MC, WL, AP ONLY)

## 2018-03-27 LAB — LIPASE, BLOOD: LIPASE: 33 U/L (ref 11–51)

## 2018-03-27 NOTE — ED Triage Notes (Signed)
Patient has been having pain on right lower abdominal pain. Patient states this pain started a month ago.

## 2018-03-28 ENCOUNTER — Emergency Department (HOSPITAL_COMMUNITY): Payer: BLUE CROSS/BLUE SHIELD

## 2018-03-28 ENCOUNTER — Emergency Department (HOSPITAL_COMMUNITY)
Admission: EM | Admit: 2018-03-28 | Discharge: 2018-03-28 | Disposition: A | Discharge status: Home / Self Care | Payer: BLUE CROSS/BLUE SHIELD | Source: Home / Self Care | Attending: Emergency Medicine | Admitting: Emergency Medicine

## 2018-03-28 ENCOUNTER — Other Ambulatory Visit: Payer: Self-pay

## 2018-03-28 ENCOUNTER — Encounter (HOSPITAL_COMMUNITY): Payer: Self-pay | Admitting: *Deleted

## 2018-03-28 DIAGNOSIS — K8020 Calculus of gallbladder without cholecystitis without obstruction: Secondary | ICD-10-CM | POA: Insufficient documentation

## 2018-03-28 DIAGNOSIS — Z87891 Personal history of nicotine dependence: Secondary | ICD-10-CM

## 2018-03-28 DIAGNOSIS — R1011 Right upper quadrant pain: Secondary | ICD-10-CM

## 2018-03-28 DIAGNOSIS — F1021 Alcohol dependence, in remission: Secondary | ICD-10-CM

## 2018-03-28 DIAGNOSIS — R079 Chest pain, unspecified: Secondary | ICD-10-CM

## 2018-03-28 DIAGNOSIS — Z79899 Other long term (current) drug therapy: Secondary | ICD-10-CM

## 2018-03-28 DIAGNOSIS — J189 Pneumonia, unspecified organism: Secondary | ICD-10-CM | POA: Insufficient documentation

## 2018-03-28 DIAGNOSIS — R911 Solitary pulmonary nodule: Secondary | ICD-10-CM

## 2018-03-28 DIAGNOSIS — R52 Pain, unspecified: Secondary | ICD-10-CM

## 2018-03-28 LAB — URINALYSIS, ROUTINE W REFLEX MICROSCOPIC

## 2018-03-28 LAB — URINALYSIS, MICROSCOPIC (REFLEX)
BACTERIA UA: NONE SEEN
RBC / HPF: >50 RBC/hpf (ref 0–5)

## 2018-03-28 LAB — CBC
HCT: 42 % (ref 36.0–46.0)
Hemoglobin: 14.2 g/dL (ref 12.0–15.0)
MCH: 32.7 pg (ref 26.0–34.0)
MCHC: 33.8 g/dL (ref 30.0–36.0)
MCV: 96.8 fL (ref 78.0–100.0)
PLATELETS: 185 10*3/uL (ref 150–400)
RBC: 4.34 MIL/uL (ref 3.87–5.11)
RDW: 12.9 % (ref 11.5–15.5)
WBC: 13.5 10*3/uL — AB (ref 4.0–10.5)

## 2018-03-28 LAB — COMPREHENSIVE METABOLIC PANEL
ALBUMIN: 4 g/dL (ref 3.5–5.0)
ALT: 33 U/L (ref 14–54)
ANION GAP: 22 — AB (ref 5–15)
AST: 75 U/L — ABNORMAL HIGH (ref 15–41)
Alkaline Phosphatase: 92 U/L (ref 38–126)
BILIRUBIN TOTAL: 2.8 mg/dL — AB (ref 0.3–1.2)
BUN: 9 mg/dL (ref 6–20)
CO2: 17 mmol/L — ABNORMAL LOW (ref 22–32)
Calcium: 8.4 mg/dL — ABNORMAL LOW (ref 8.9–10.3)
Chloride: 105 mmol/L (ref 101–111)
Creatinine, Ser: 0.51 mg/dL (ref 0.44–1.00)
GFR calc non Af Amer: >60 mL/min (ref >60)
GLUCOSE: 93 mg/dL (ref 65–99)
POTASSIUM: 4.1 mmol/L (ref 3.5–5.1)
SODIUM: 144 mmol/L (ref 135–145)
TOTAL PROTEIN: 7.2 g/dL (ref 6.5–8.1)

## 2018-03-28 LAB — LIPASE, BLOOD: Lipase: 26 U/L (ref 11–51)

## 2018-03-28 LAB — I-STAT BETA HCG BLOOD, ED (MC, WL, AP ONLY)

## 2018-03-28 MED ORDER — ONDANSETRON 4 MG PO TBDP: 4.0000 mg | ORAL_TABLET | Freq: Three times a day (TID) | ORAL | 0 refills | Status: DC | PRN

## 2018-03-28 MED ORDER — SODIUM CHLORIDE 0.9 % IV BOLUS
1000.0000 mL | Freq: Once | INTRAVENOUS | Status: AC
  Administered 2018-03-28: 1000 mL via INTRAVENOUS

## 2018-03-28 MED ORDER — IOPAMIDOL (ISOVUE-300) INJECTION 61%
100.0000 mL | Freq: Once | INTRAVENOUS | Status: AC | PRN
  Administered 2018-03-28: 100 mL via INTRAVENOUS

## 2018-03-28 MED ORDER — ONDANSETRON HCL 4 MG/2ML IJ SOLN
4.0000 mg | Freq: Once | INTRAMUSCULAR | Status: AC
  Administered 2018-03-28: 4 mg via INTRAVENOUS
  Filled 2018-03-28: qty 2

## 2018-03-28 MED ORDER — AZITHROMYCIN 250 MG PO TABS: ORAL_TABLET | ORAL | 0 refills | Status: DC

## 2018-03-28 MED ORDER — HYDROMORPHONE HCL 1 MG/ML IJ SOLN
1.0000 mg | Freq: Once | INTRAMUSCULAR | Status: AC
  Administered 2018-03-28: 1 mg via INTRAVENOUS
  Filled 2018-03-28: qty 1

## 2018-03-28 MED ORDER — SODIUM CHLORIDE 0.9 % IJ SOLN
INTRAMUSCULAR | Status: AC
  Filled 2018-03-28: qty 50

## 2018-03-28 MED ORDER — HYDROCODONE-ACETAMINOPHEN 5-325 MG PO TABS: 1.0000 | ORAL_TABLET | ORAL | 0 refills | Status: DC | PRN

## 2018-03-28 MED ORDER — MORPHINE SULFATE (PF) 4 MG/ML IV SOLN
4.0000 mg | Freq: Once | INTRAVENOUS | Status: AC
  Administered 2018-03-28: 4 mg via INTRAVENOUS
  Filled 2018-03-28: qty 1

## 2018-03-28 MED ORDER — IOPAMIDOL (ISOVUE-300) INJECTION 61%
INTRAVENOUS | Status: AC
  Filled 2018-03-28: qty 100

## 2018-03-28 MED ORDER — AZITHROMYCIN 250 MG PO TABS
500.0000 mg | ORAL_TABLET | Freq: Once | ORAL | Status: AC
  Administered 2018-03-28: 500 mg via ORAL
  Filled 2018-03-28: qty 2

## 2018-03-28 MED ORDER — PROMETHAZINE HCL 25 MG/ML IJ SOLN
6.2500 mg | Freq: Once | INTRAMUSCULAR | Status: AC
  Administered 2018-03-28: 6.25 mg via INTRAVENOUS
  Filled 2018-03-28: qty 1

## 2018-03-28 NOTE — ED Provider Notes (Signed)
[facility] DEPT Provider Note   CSN: [id number] Arrival date & time: 03/28/18  1051     History   Chief Complaint No chief complaint on file.   HPI Victoria Osborne is a 57 y.o. female.  Pt presents to the ED today with abd pain, n/v.  Pt said sx started a few weeks ago, but it has gotten worse.  She said the pain is severe now.  No f/c.     Past Medical History:  Diagnosis Date  . Anxiety   . Depression   . Substance abuse [facility])     Patient Active Problem List   Diagnosis Date Noted  . Atypical chest pain 03/17/2018  . Abnormal EKG 03/17/2018  . Rhus dermatitis 06/12/2013  . History of alcohol dependence ([place]) 04/18/2013  . Depression 04/01/2013    Past Surgical History:  Procedure Laterality Date  . CESAREAN SECTION       OB History    Gravida  2   Para  1   Term  1   Preterm      AB  1   Living  1     SAB      TAB  1   Ectopic      Multiple      Live Births               Home Medications    Prior to Admission medications   Medication Sig Start Date End Date Taking? Authorizing Provider  ALPRAZolam Duanne Moron) 0.5 MG tablet TAKE 1 TABLET BY MOUTH AT BEDTIME AS NEEDED FOR ANXIETY 01/26/18  Yes McVey, Gelene Mink, PA-C  augmented betamethasone dipropionate (DIPROLENE AF) 0.05 % cream Apply topically 2 (two) times daily. 12/10/17  Yes McVey, Gelene Mink, PA-C  FLUoxetine (PROZAC) 20 MG tablet Take 3 tablets (60 mg total) by mouth daily. 03/11/18  Yes McVey, Gelene Mink, PA-C  FLUoxetine (PROZAC) 40 MG capsule TAKE 1 CAPSULE (40 MG TOTAL) BY MOUTH DAILY. 12/10/17  Yes McVey, Gelene Mink, PA-C  Vitamin D, Ergocalciferol, (DRISDOL) 50000 units CAPS capsule Take 1 capsule (50,000 Units total) by mouth every 7 (seven) days. Patient taking differently: Take 50,000 Units by mouth every 7 (seven) days. Mondays 03/13/18  Yes McVey, Gelene Mink, PA-C  azithromycin (ZITHROMAX) 250 MG tablet  Take daily 03/29/18   Isla Pence, MD  HYDROcodone-acetaminophen (NORCO/VICODIN) 5-325 MG tablet Take 1 tablet by mouth every 4 (four) hours as needed. 03/28/18   Isla Pence, MD  ondansetron (ZOFRAN ODT) 4 MG disintegrating tablet Take 1 tablet (4 mg total) by mouth every 8 (eight) hours as needed. 03/28/18   Isla Pence, MD    Family History Family History  Problem Relation Age of Onset  . Depression Mother   . Diabetes Sister     Social History Social History   Tobacco Use  . Smoking status: Former Research scientist (life sciences)  . Smokeless tobacco: Never Used  Substance Use Topics  . Alcohol use: Yes    Alcohol/week: 6.0 oz    Types: 10 Standard drinks or equivalent per week    Comment: Rare  . Drug use: No     Allergies   Patient has no known allergies.   Review of Systems Review of Systems  Gastrointestinal: Positive for abdominal pain, nausea and vomiting.  All other systems reviewed and are negative.    Physical Exam Updated Vital Signs BP (!) 143/103 (BP Location: Right Arm)   Pulse (!) 108   Temp (!) 97.5  F (36.4 C) (Oral)   Resp 17   Ht 5\' 7"  (1.702 m)   Wt 66.7 kg (147 lb)   LMP 03/08/2018   SpO2 98%   BMI 23.02 kg/m   Physical Exam  Constitutional: She is oriented to person, place, and time. She appears well-developed. She appears distressed.  HENT:  Head: Normocephalic and atraumatic.  Right Ear: External ear normal.  Left Ear: External ear normal.  Nose: Nose normal.  Mouth/Throat: Mucous membranes are dry.  Eyes: Pupils are equal, round, and reactive to light. Conjunctivae and EOM are normal.  Neck: Normal range of motion. Neck supple.  Cardiovascular: Regular rhythm, normal heart sounds and intact distal pulses. Tachycardia present.  Pulmonary/Chest: Effort normal and breath sounds normal.  Abdominal: Soft. Bowel sounds are normal. There is tenderness in the right upper quadrant.  Musculoskeletal: Normal range of motion.  Neurological: She is  alert and oriented to person, place, and time.  Skin: Skin is warm. Capillary refill takes less than 2 seconds.  Psychiatric: Her mood appears anxious.  Nursing note and vitals reviewed.    ED Treatments / Results  Labs (all labs ordered are listed, but only abnormal results are displayed) Labs Reviewed  CBC - Abnormal; Notable for the following components:      Result Value   WBC 13.5 (*)    All other components within normal limits  URINALYSIS, ROUTINE W REFLEX MICROSCOPIC - Abnormal; Notable for the following components:   Color, Urine RED (*)    APPearance TURBID (*)    Glucose, UA   (*)    Value: TEST NOT REPORTED DUE TO COLOR INTERFERENCE OF URINE PIGMENT   Hgb urine dipstick   (*)    Value: TEST NOT REPORTED DUE TO COLOR INTERFERENCE OF URINE PIGMENT   Bilirubin Urine   (*)    Value: TEST NOT REPORTED DUE TO COLOR INTERFERENCE OF URINE PIGMENT   Ketones, ur   (*)    Value: TEST NOT REPORTED DUE TO COLOR INTERFERENCE OF URINE PIGMENT   Protein, ur   (*)    Value: TEST NOT REPORTED DUE TO COLOR INTERFERENCE OF URINE PIGMENT   Nitrite   (*)    Value: TEST NOT REPORTED DUE TO COLOR INTERFERENCE OF URINE PIGMENT   Leukocytes, UA   (*)    Value: TEST NOT REPORTED DUE TO COLOR INTERFERENCE OF URINE PIGMENT   All other components within normal limits  COMPREHENSIVE METABOLIC PANEL - Abnormal; Notable for the following components:   CO2 17 (*)    Calcium 8.4 (*)    AST 75 (*)    Total Bilirubin 2.8 (*)    All other components within normal limits  LIPASE, BLOOD  URINALYSIS, MICROSCOPIC (REFLEX)  I-STAT BETA HCG BLOOD, ED ([facility], [facility], [facility] ONLY)   Pt's UA with blood due to pt currently having her period.   EKG None  Radiology Ct Chest W Contrast  Result Date: 03/28/2018 CLINICAL DATA:  Generalized abdominal pain.  Substance abuse. EXAM: CT CHEST, ABDOMEN, AND PELVIS WITH CONTRAST TECHNIQUE: Multidetector CT imaging of the chest, abdomen and pelvis was performed following  the standard protocol during bolus administration of intravenous contrast. CONTRAST:  166mL ISOVUE-300 IOPAMIDOL (ISOVUE-300) INJECTION 61% COMPARISON:  None. FINDINGS: CT CHEST FINDINGS Cardiovascular: Coronary artery calcifications identified. The thoracic aorta demonstrates mild atherosclerotic change without aneurysm or dissection. The main pulmonary artery is normal in caliber. No filling defects within the central pulmonary arteries. The heart size is normal. Mediastinum/Nodes:  No enlarged mediastinal, hilar, or axillary lymph nodes. Thyroid gland, trachea, and esophagus demonstrate no significant findings. Lungs/Pleura: There is a solid nodule in the left apex measuring 9.2 mm on axial image 33. Adjacent ground-glass nodularity, some of which is cystic in appearance is identified. This is well appreciated on series 5, image 98. The cystic components are thin walled. There is a nodule in the left lower lobe on series 7, image 81 measuring 5.6 mm. No other nodules, masses, or infiltrates. Musculoskeletal: See below. CT ABDOMEN PELVIS FINDINGS Hepatobiliary: Hepatic steatosis. No liver mass identified. Cholelithiasis identified. No wall thickening or adjacent fluid. The portal vein is patent. Pancreas: Unremarkable. No pancreatic ductal dilatation or surrounding inflammatory changes. Spleen: Normal in size without focal abnormality. Adrenals/Urinary Tract: Adrenal glands are normal. Probable tiny cysts in the right kidney, too small to characterize. No hydronephrosis or perinephric stranding. No renal or ureteral stones. The visualized bladder is normal. Stomach/Bowel: The stomach and small bowel are normal. The colon is unremarkable. The appendix is normal. Vascular/Lymphatic: Mild atherosclerotic change in the nonaneurysmal aorta. No adenopathy. Reproductive: Uterus and bilateral adnexa are unremarkable. Other: No abdominal wall hernia or abnormality. No abdominopelvic ascites. Musculoskeletal: No acute or  significant osseous findings. IMPRESSION: 1. There is a 9.2 mm nodule in the left upper lobe. There is a 5.6 mm nodule in the left lower lobe. Recommend follow-up CT scan in 3 months. 2. Adjacent to the 9.2 mm nodule in the left upper lobe is mildly nodular ground-glass and cystic change. The findings could be due to either an acute or remote infectious or inflammatory process. Neoplasm not completely excluded. Recommend attention on the a three-month follow-up CT scan. 3. Cholelithiasis. 4. Mild atherosclerosis in the aorta. Coronary artery calcifications. 5. No other significant abnormalities. Aortic Atherosclerosis (ICD10-I70.0). Electronically Signed   By: Dorise Bullion III M.D   On: 03/28/2018 16:13   Ct Abdomen Pelvis W Contrast  Result Date: 03/28/2018 CLINICAL DATA:  Generalized abdominal pain.  Substance abuse. EXAM: CT CHEST, ABDOMEN, AND PELVIS WITH CONTRAST TECHNIQUE: Multidetector CT imaging of the chest, abdomen and pelvis was performed following the standard protocol during bolus administration of intravenous contrast. CONTRAST:  155mL ISOVUE-300 IOPAMIDOL (ISOVUE-300) INJECTION 61% COMPARISON:  None. FINDINGS: CT CHEST FINDINGS Cardiovascular: Coronary artery calcifications identified. The thoracic aorta demonstrates mild atherosclerotic change without aneurysm or dissection. The main pulmonary artery is normal in caliber. No filling defects within the central pulmonary arteries. The heart size is normal. Mediastinum/Nodes: No enlarged mediastinal, hilar, or axillary lymph nodes. Thyroid gland, trachea, and esophagus demonstrate no significant findings. Lungs/Pleura: There is a solid nodule in the left apex measuring 9.2 mm on axial image 33. Adjacent ground-glass nodularity, some of which is cystic in appearance is identified. This is well appreciated on series 5, image 98. The cystic components are thin walled. There is a nodule in the left lower lobe on series 7, image 81 measuring 5.6 mm.  No other nodules, masses, or infiltrates. Musculoskeletal: See below. CT ABDOMEN PELVIS FINDINGS Hepatobiliary: Hepatic steatosis. No liver mass identified. Cholelithiasis identified. No wall thickening or adjacent fluid. The portal vein is patent. Pancreas: Unremarkable. No pancreatic ductal dilatation or surrounding inflammatory changes. Spleen: Normal in size without focal abnormality. Adrenals/Urinary Tract: Adrenal glands are normal. Probable tiny cysts in the right kidney, too small to characterize. No hydronephrosis or perinephric stranding. No renal or ureteral stones. The visualized bladder is normal. Stomach/Bowel: The stomach and small bowel are normal. The colon is unremarkable. The  appendix is normal. Vascular/Lymphatic: Mild atherosclerotic change in the nonaneurysmal aorta. No adenopathy. Reproductive: Uterus and bilateral adnexa are unremarkable. Other: No abdominal wall hernia or abnormality. No abdominopelvic ascites. Musculoskeletal: No acute or significant osseous findings. IMPRESSION: 1. There is a 9.2 mm nodule in the left upper lobe. There is a 5.6 mm nodule in the left lower lobe. Recommend follow-up CT scan in 3 months. 2. Adjacent to the 9.2 mm nodule in the left upper lobe is mildly nodular ground-glass and cystic change. The findings could be due to either an acute or remote infectious or inflammatory process. Neoplasm not completely excluded. Recommend attention on the a three-month follow-up CT scan. 3. Cholelithiasis. 4. Mild atherosclerosis in the aorta. Coronary artery calcifications. 5. No other significant abnormalities. Aortic Atherosclerosis (ICD10-I70.0). Electronically Signed   By: Dorise Bullion III M.D   On: 03/28/2018 16:13   US Abdomen Limited Ruq  Result Date: 03/28/2018 CLINICAL DATA:  Right upper quadrant pain for 2 weeks. Nausea and vomiting today EXAM: ULTRASOUND ABDOMEN LIMITED RIGHT UPPER QUADRANT COMPARISON:  None. FINDINGS: Gallbladder: Single subtly shadowing  gallstone measuring 5 mm. There is small volume dependent sludge. No wall thickening or focal tenderness. Common bile duct: Diameter: 3 mm.  Where visualized, no filling defect. Liver: Echogenic liver with poor acoustic penetration. No visible mass lesion. Portal vein is patent on color Doppler imaging with normal direction of blood flow towards the liver. IMPRESSION: 1. Single gallstone with sludge.  No evidence of cholecystitis. 2. Hepatic steatosis. Electronically Signed   By: Monte Fantasia M.D.   On: 03/28/2018 14:12    Procedures Procedures (including critical care time)  Medications Ordered in ED Medications  iopamidol (ISOVUE-300) 61 % injection (has no administration in time range)  sodium chloride 0.9 % injection (has no administration in time range)  HYDROmorphone (DILAUDID) injection 1 mg (has no administration in time range)  azithromycin (ZITHROMAX) tablet 500 mg (has no administration in time range)  ondansetron (ZOFRAN) injection 4 mg (4 mg Intravenous Given 03/28/18 1159)  sodium chloride 0.9 % bolus 1,000 mL (0 mLs Intravenous Stopped 03/28/18 1303)  morphine 4 MG/ML injection 4 mg (4 mg Intravenous Given 03/28/18 1159)  HYDROmorphone (DILAUDID) injection 1 mg (1 mg Intravenous Given 03/28/18 1303)  promethazine (PHENERGAN) injection 6.25 mg (6.25 mg Intravenous Given 03/28/18 1303)  iopamidol (ISOVUE-300) 61 % injection 100 mL (100 mLs Intravenous Contrast Given 03/28/18 1452)     Initial Impression / Assessment and Plan / ED Course  I have reviewed the triage vital signs and the nursing notes.  Pertinent labs & imaging results that were available during my care of the patient were reviewed by me and considered in my medical decision making (see chart for details).     Pt is feeling better.  Her pain is more in her chest now, not RUQ.  I will treat with abx in case she has pna.  She is told she needs a repeat chest CT in 3 months to evaluate her lung nodules.  She does have  1 gallstone without gb wall thickening.  She is instructed to f/u with surgery if she continues to have RUQ pain.  Final Clinical Impressions(s) / ED Diagnoses   Final diagnoses:  Pain  Pulmonary nodule  Calculus of gallbladder without cholecystitis without obstruction  Community acquired pneumonia, unspecified laterality    ED Discharge Orders        Ordered    azithromycin (ZITHROMAX) 250 MG tablet     03/28/18  1622    HYDROcodone-acetaminophen (NORCO/VICODIN) 5-325 MG tablet  Every 4 hours PRN     03/28/18 1622    ondansetron (ZOFRAN ODT) 4 MG disintegrating tablet  Every 8 hours PRN     03/28/18 1622       Isla Pence, MD 03/28/18 1624

## 2018-03-28 NOTE — Discharge Instructions (Addendum)
Repeat CT chest in 3 months 

## 2018-03-28 NOTE — ED Triage Notes (Signed)
Pt with abd pain, was told she has a small mass, appointment in May, pt coughing and vomiting in triage, Tachycardia

## 2018-03-28 NOTE — ED Notes (Signed)
Patient stated she wants to leave and see her doctor tomorrow because it is taking to long

## 2018-03-28 NOTE — ED Notes (Signed)
Urine culture sent with UA 

## 2018-03-28 NOTE — ED Notes (Signed)
Pt c/o dizziness when standing d/t dilaudid. Pt allowed to remain in room until medication effects reduce because she will be taking an [place] home

## 2018-04-14 ENCOUNTER — Ambulatory Visit (HOSPITAL_BASED_OUTPATIENT_CLINIC_OR_DEPARTMENT_OTHER): Payer: BLUE CROSS/BLUE SHIELD

## 2018-04-14 ENCOUNTER — Ambulatory Visit: Payer: BLUE CROSS/BLUE SHIELD | Admitting: Physician Assistant

## 2018-04-15 ENCOUNTER — Other Ambulatory Visit: Payer: Self-pay | Admitting: Physician Assistant

## 2018-04-15 DIAGNOSIS — E559 Vitamin D deficiency, unspecified: Secondary | ICD-10-CM

## 2018-04-15 NOTE — Telephone Encounter (Signed)
Refill request for Vit D 50,000 iiu LOV: 03/11/18 PCP: Benjamine Mola McVey,PA

## 2018-04-16 ENCOUNTER — Telehealth: Payer: Self-pay | Admitting: *Deleted

## 2018-04-16 NOTE — Telephone Encounter (Signed)
Request for Vit d 50,000 units denied. Pt was given #6 on 03/13/18. Pt was advised to rtc in 1-2 wks for bloodwork.

## 2018-04-17 ENCOUNTER — Other Ambulatory Visit: Payer: Self-pay | Admitting: Physician Assistant

## 2018-04-17 ENCOUNTER — Other Ambulatory Visit (HOSPITAL_BASED_OUTPATIENT_CLINIC_OR_DEPARTMENT_OTHER): Payer: BLUE CROSS/BLUE SHIELD

## 2018-04-17 ENCOUNTER — Telehealth: Payer: Self-pay | Admitting: Physician Assistant

## 2018-04-17 DIAGNOSIS — E559 Vitamin D deficiency, unspecified: Secondary | ICD-10-CM

## 2018-04-17 DIAGNOSIS — F411 Generalized anxiety disorder: Secondary | ICD-10-CM

## 2018-04-17 NOTE — Telephone Encounter (Signed)
Vitamin D3 refill Last OV: 03/13/18 Last Refill:03/13/18 #6 capsules no RF Pharmacy:[facility] [id number] McVey PA-C

## 2018-04-17 NOTE — Telephone Encounter (Signed)
Pt was prescribed acyclovir for stomatitis 04/10/18. On 04/12/18 pt was having up to 7 diarrhea stools per day. On 04/13/18 pt stopped taking the med. They are asking if she should keep taking the med or be prescribed something else. Pt is also c/o bitter taste in mouth. Please advise.

## 2018-04-17 NOTE — Telephone Encounter (Signed)
PCP is Juanda Crumble  [facility] needs a DX [facility] for [facility] Prozac 20 mg tablet

## 2018-04-19 ENCOUNTER — Other Ambulatory Visit: Payer: Self-pay

## 2018-04-19 DIAGNOSIS — E559 Vitamin D deficiency, unspecified: Secondary | ICD-10-CM

## 2018-04-19 MED ORDER — VITAMIN D (ERGOCALCIFEROL) 1.25 MG (50000 UNIT) PO CAPS: 50000.0000 [IU] | ORAL_CAPSULE | ORAL | 0 refills | Status: DC

## 2018-04-19 NOTE — Telephone Encounter (Signed)
[place], gave DX code F32.9 Depression

## 2018-04-21 ENCOUNTER — Ambulatory Visit: Payer: BLUE CROSS/BLUE SHIELD | Admitting: Cardiology

## 2018-05-03 ENCOUNTER — Other Ambulatory Visit: Payer: Self-pay | Admitting: Physician Assistant

## 2018-05-03 DIAGNOSIS — E559 Vitamin D deficiency, unspecified: Secondary | ICD-10-CM

## 2018-06-16 ENCOUNTER — Other Ambulatory Visit: Payer: Self-pay | Admitting: Physician Assistant

## 2018-06-16 ENCOUNTER — Ambulatory Visit: Payer: Self-pay | Admitting: Urgent Care

## 2018-06-16 DIAGNOSIS — F411 Generalized anxiety disorder: Secondary | ICD-10-CM

## 2018-06-16 NOTE — Telephone Encounter (Signed)
alprazolam refill Last Refill:01/26/18 # 30 Last OV: 03/11/18 PCP: Mercer Pod PA

## 2018-06-17 ENCOUNTER — Other Ambulatory Visit: Payer: Self-pay | Admitting: Physician Assistant

## 2018-06-17 DIAGNOSIS — Z1321 Encounter for screening for nutritional disorder: Secondary | ICD-10-CM

## 2018-11-06 ENCOUNTER — Other Ambulatory Visit: Payer: Self-pay | Admitting: Physician Assistant

## 2018-11-06 DIAGNOSIS — F411 Generalized anxiety disorder: Secondary | ICD-10-CM

## 2018-11-06 NOTE — Telephone Encounter (Signed)
Requested medication (s) are due for refill today: yes  Requested medication (s) are on the active medication yes  Last refill:  06/17/18   Future office visit  no  Notes to clinic:       Requested Prescriptions  Pending Prescriptions Disp Refills   ALPRAZolam (XANAX) 0.5 MG tablet [Pharmacy Med Name: ALPRAZOLAM 0.5 MG TABLET] 30 tablet 0    Sig: TAKE 1 TABLET BY MOUTH AT BEDTIME AS NEEDED FOR ANXIETY     Not Delegated - Psychiatry:  Anxiolytics/Hypnotics Failed - 11/06/2018  1:02 PM      Failed - This refill cannot be delegated      Failed - Urine Drug Screen completed in last 360 days.      Failed - Valid encounter within last 6 months    Recent Outpatient Visits          8 months ago Nausea without vomiting   [facility] at [facility], Gelene Mink, PA-C   11 months ago Annual physical exam   Primary Care at [facility], Gelene Mink, PA-C   2 years ago Visit for screening mammogram   [facility] at [facility], Dalbert Batman, [place]   2 years ago Finger laceration, subsequent encounter   Primary Care at [place], [place]   2 years ago Finger laceration, initial encounter   Primary Care at [facility], [place], DO

## 2018-11-09 NOTE — Telephone Encounter (Signed)
pls see med request. thanks

## 2019-08-04 ENCOUNTER — Emergency Department (HOSPITAL_COMMUNITY): Payer: BLUE CROSS/BLUE SHIELD

## 2019-08-04 ENCOUNTER — Inpatient Hospital Stay (HOSPITAL_COMMUNITY)
Admission: EM | Admit: 2019-08-04 | Discharge: 2019-09-02 | DRG: 870 | Disposition: E | Payer: BLUE CROSS/BLUE SHIELD | Attending: Pulmonary Disease | Admitting: Pulmonary Disease

## 2019-08-04 ENCOUNTER — Encounter (HOSPITAL_COMMUNITY): Payer: Self-pay

## 2019-08-04 ENCOUNTER — Other Ambulatory Visit: Payer: Self-pay

## 2019-08-04 DIAGNOSIS — E440 Moderate protein-calorie malnutrition: Secondary | ICD-10-CM | POA: Insufficient documentation

## 2019-08-04 DIAGNOSIS — Z87891 Personal history of nicotine dependence: Secondary | ICD-10-CM

## 2019-08-04 DIAGNOSIS — B961 Klebsiella pneumoniae [K. pneumoniae] as the cause of diseases classified elsewhere: Secondary | ICD-10-CM | POA: Diagnosis present

## 2019-08-04 DIAGNOSIS — R6521 Severe sepsis with septic shock: Secondary | ICD-10-CM | POA: Diagnosis present

## 2019-08-04 DIAGNOSIS — D65 Disseminated intravascular coagulation [defibrination syndrome]: Secondary | ICD-10-CM | POA: Diagnosis present

## 2019-08-04 DIAGNOSIS — E872 Acidosis, unspecified: Secondary | ICD-10-CM | POA: Diagnosis present

## 2019-08-04 DIAGNOSIS — J9600 Acute respiratory failure, unspecified whether with hypoxia or hypercapnia: Secondary | ICD-10-CM

## 2019-08-04 DIAGNOSIS — J189 Pneumonia, unspecified organism: Secondary | ICD-10-CM | POA: Diagnosis not present

## 2019-08-04 DIAGNOSIS — Z79891 Long term (current) use of opiate analgesic: Secondary | ICD-10-CM

## 2019-08-04 DIAGNOSIS — Z818 Family history of other mental and behavioral disorders: Secondary | ICD-10-CM

## 2019-08-04 DIAGNOSIS — Z66 Do not resuscitate: Secondary | ICD-10-CM | POA: Diagnosis not present

## 2019-08-04 DIAGNOSIS — Z20828 Contact with and (suspected) exposure to other viral communicable diseases: Secondary | ICD-10-CM | POA: Diagnosis present

## 2019-08-04 DIAGNOSIS — E43 Unspecified severe protein-calorie malnutrition: Secondary | ICD-10-CM | POA: Diagnosis present

## 2019-08-04 DIAGNOSIS — K631 Perforation of intestine (nontraumatic): Secondary | ICD-10-CM

## 2019-08-04 DIAGNOSIS — R7881 Bacteremia: Secondary | ICD-10-CM | POA: Diagnosis not present

## 2019-08-04 DIAGNOSIS — E162 Hypoglycemia, unspecified: Secondary | ICD-10-CM | POA: Diagnosis not present

## 2019-08-04 DIAGNOSIS — I959 Hypotension, unspecified: Secondary | ICD-10-CM

## 2019-08-04 DIAGNOSIS — D689 Coagulation defect, unspecified: Secondary | ICD-10-CM | POA: Diagnosis present

## 2019-08-04 DIAGNOSIS — E876 Hypokalemia: Secondary | ICD-10-CM | POA: Diagnosis present

## 2019-08-04 DIAGNOSIS — K767 Hepatorenal syndrome: Secondary | ICD-10-CM | POA: Diagnosis present

## 2019-08-04 DIAGNOSIS — K7201 Acute and subacute hepatic failure with coma: Secondary | ICD-10-CM

## 2019-08-04 DIAGNOSIS — D684 Acquired coagulation factor deficiency: Secondary | ICD-10-CM | POA: Diagnosis present

## 2019-08-04 DIAGNOSIS — G9341 Metabolic encephalopathy: Secondary | ICD-10-CM | POA: Diagnosis present

## 2019-08-04 DIAGNOSIS — Z452 Encounter for adjustment and management of vascular access device: Secondary | ICD-10-CM

## 2019-08-04 DIAGNOSIS — Z6825 Body mass index (BMI) 25.0-25.9, adult: Secondary | ICD-10-CM | POA: Diagnosis not present

## 2019-08-04 DIAGNOSIS — Z9911 Dependence on respirator [ventilator] status: Secondary | ICD-10-CM | POA: Diagnosis not present

## 2019-08-04 DIAGNOSIS — F329 Major depressive disorder, single episode, unspecified: Secondary | ICD-10-CM | POA: Diagnosis present

## 2019-08-04 DIAGNOSIS — R7989 Other specified abnormal findings of blood chemistry: Secondary | ICD-10-CM

## 2019-08-04 DIAGNOSIS — Z515 Encounter for palliative care: Secondary | ICD-10-CM | POA: Diagnosis not present

## 2019-08-04 DIAGNOSIS — E15 Nondiabetic hypoglycemic coma: Secondary | ICD-10-CM | POA: Diagnosis present

## 2019-08-04 DIAGNOSIS — B9620 Unspecified Escherichia coli [E. coli] as the cause of diseases classified elsewhere: Secondary | ICD-10-CM | POA: Diagnosis present

## 2019-08-04 DIAGNOSIS — R10819 Abdominal tenderness, unspecified site: Secondary | ICD-10-CM

## 2019-08-04 DIAGNOSIS — J9601 Acute respiratory failure with hypoxia: Secondary | ICD-10-CM | POA: Diagnosis present

## 2019-08-04 DIAGNOSIS — F1020 Alcohol dependence, uncomplicated: Secondary | ICD-10-CM | POA: Diagnosis present

## 2019-08-04 DIAGNOSIS — Z9289 Personal history of other medical treatment: Secondary | ICD-10-CM

## 2019-08-04 DIAGNOSIS — D62 Acute posthemorrhagic anemia: Secondary | ICD-10-CM | POA: Diagnosis not present

## 2019-08-04 DIAGNOSIS — F419 Anxiety disorder, unspecified: Secondary | ICD-10-CM | POA: Diagnosis present

## 2019-08-04 DIAGNOSIS — A4159 Other Gram-negative sepsis: Secondary | ICD-10-CM | POA: Diagnosis not present

## 2019-08-04 DIAGNOSIS — L8990 Pressure ulcer of unspecified site, unspecified stage: Secondary | ICD-10-CM | POA: Insufficient documentation

## 2019-08-04 DIAGNOSIS — J9690 Respiratory failure, unspecified, unspecified whether with hypoxia or hypercapnia: Secondary | ICD-10-CM

## 2019-08-04 DIAGNOSIS — Z978 Presence of other specified devices: Secondary | ICD-10-CM

## 2019-08-04 DIAGNOSIS — K7010 Alcoholic hepatitis without ascites: Secondary | ICD-10-CM | POA: Diagnosis present

## 2019-08-04 DIAGNOSIS — N170 Acute kidney failure with tubular necrosis: Secondary | ICD-10-CM | POA: Diagnosis present

## 2019-08-04 DIAGNOSIS — Z992 Dependence on renal dialysis: Secondary | ICD-10-CM

## 2019-08-04 DIAGNOSIS — G2581 Restless legs syndrome: Secondary | ICD-10-CM | POA: Diagnosis present

## 2019-08-04 DIAGNOSIS — N390 Urinary tract infection, site not specified: Secondary | ICD-10-CM | POA: Diagnosis present

## 2019-08-04 DIAGNOSIS — N939 Abnormal uterine and vaginal bleeding, unspecified: Secondary | ICD-10-CM | POA: Diagnosis present

## 2019-08-04 DIAGNOSIS — J9811 Atelectasis: Secondary | ICD-10-CM | POA: Diagnosis not present

## 2019-08-04 DIAGNOSIS — Z79899 Other long term (current) drug therapy: Secondary | ICD-10-CM

## 2019-08-04 DIAGNOSIS — E7220 Disorder of urea cycle metabolism, unspecified: Secondary | ICD-10-CM

## 2019-08-04 DIAGNOSIS — R945 Abnormal results of liver function studies: Secondary | ICD-10-CM | POA: Diagnosis present

## 2019-08-04 DIAGNOSIS — K567 Ileus, unspecified: Secondary | ICD-10-CM

## 2019-08-04 DIAGNOSIS — R9431 Abnormal electrocardiogram [ECG] [EKG]: Secondary | ICD-10-CM

## 2019-08-04 DIAGNOSIS — K7290 Hepatic failure, unspecified without coma: Secondary | ICD-10-CM

## 2019-08-04 DIAGNOSIS — K7682 Hepatic encephalopathy: Secondary | ICD-10-CM | POA: Diagnosis present

## 2019-08-04 DIAGNOSIS — A419 Sepsis, unspecified organism: Secondary | ICD-10-CM

## 2019-08-04 DIAGNOSIS — R17 Unspecified jaundice: Secondary | ICD-10-CM

## 2019-08-04 DIAGNOSIS — Z833 Family history of diabetes mellitus: Secondary | ICD-10-CM

## 2019-08-04 DIAGNOSIS — K7200 Acute and subacute hepatic failure without coma: Secondary | ICD-10-CM | POA: Diagnosis present

## 2019-08-04 DIAGNOSIS — K859 Acute pancreatitis without necrosis or infection, unspecified: Secondary | ICD-10-CM | POA: Diagnosis present

## 2019-08-04 DIAGNOSIS — N179 Acute kidney failure, unspecified: Secondary | ICD-10-CM

## 2019-08-04 DIAGNOSIS — R34 Anuria and oliguria: Secondary | ICD-10-CM | POA: Diagnosis not present

## 2019-08-04 DIAGNOSIS — K7460 Unspecified cirrhosis of liver: Secondary | ICD-10-CM

## 2019-08-04 DIAGNOSIS — Z791 Long term (current) use of non-steroidal anti-inflammatories (NSAID): Secondary | ICD-10-CM

## 2019-08-04 HISTORY — DX: Restless legs syndrome: G25.81

## 2019-08-04 LAB — URINALYSIS, ROUTINE W REFLEX MICROSCOPIC
Glucose, UA: >=500 mg/dL — AB
Ketones, ur: 15 mg/dL — AB
Nitrite: NEGATIVE
Protein, ur: >300 mg/dL — AB
Specific Gravity, Urine: 1.02 (ref 1.005–1.030)
pH: 7.5 (ref 5.0–8.0)

## 2019-08-04 LAB — CBC
HCT: 28.4 % — ABNORMAL LOW (ref 36.0–46.0)
Hemoglobin: 10 g/dL — ABNORMAL LOW (ref 12.0–15.0)
MCH: 38.5 pg — ABNORMAL HIGH (ref 26.0–34.0)
MCHC: 35.2 g/dL (ref 30.0–36.0)
MCV: 109.2 fL — ABNORMAL HIGH (ref 80.0–100.0)
Platelets: 134 K/uL — ABNORMAL LOW (ref 150–400)
RBC: 2.6 MIL/uL — ABNORMAL LOW (ref 3.87–5.11)
RDW: 22.1 % — ABNORMAL HIGH (ref 11.5–15.5)
WBC: 46.8 K/uL — ABNORMAL HIGH (ref 4.0–10.5)
nRBC: 1.8 % — ABNORMAL HIGH (ref 0.0–0.2)

## 2019-08-04 LAB — COMPREHENSIVE METABOLIC PANEL
ALT: 90 U/L — ABNORMAL HIGH (ref 0–44)
AST: 318 U/L — ABNORMAL HIGH (ref 15–41)
Albumin: 1.7 g/dL — ABNORMAL LOW (ref 3.5–5.0)
Alkaline Phosphatase: 328 U/L — ABNORMAL HIGH (ref 38–126)
Anion gap: 32 — ABNORMAL HIGH (ref 5–15)
BUN: 88 mg/dL — ABNORMAL HIGH (ref 6–20)
CO2: 7 mmol/L — ABNORMAL LOW (ref 22–32)
Calcium: 6.8 mg/dL — ABNORMAL LOW (ref 8.9–10.3)
Chloride: 96 mmol/L — ABNORMAL LOW (ref 98–111)
Creatinine, Ser: 6.09 mg/dL — ABNORMAL HIGH (ref 0.44–1.00)
GFR calc Af Amer: 8 mL/min — ABNORMAL LOW (ref >60)
GFR calc non Af Amer: 7 mL/min — ABNORMAL LOW (ref >60)
Glucose, Bld: 103 mg/dL — ABNORMAL HIGH (ref 70–99)
Potassium: 4.3 mmol/L (ref 3.5–5.1)
Sodium: 135 mmol/L (ref 135–145)
Total Bilirubin: 27.1 mg/dL (ref 0.3–1.2)
Total Protein: 5.2 g/dL — ABNORMAL LOW (ref 6.5–8.1)

## 2019-08-04 LAB — AMMONIA: Ammonia: 241 umol/L — ABNORMAL HIGH (ref 9–35)

## 2019-08-04 LAB — LIPASE, BLOOD: Lipase: 179 U/L — ABNORMAL HIGH (ref 11–51)

## 2019-08-04 LAB — PROTIME-INR
INR: 3 — ABNORMAL HIGH (ref 0.8–1.2)
Prothrombin Time: 30.5 s — ABNORMAL HIGH (ref 11.4–15.2)

## 2019-08-04 LAB — RAPID URINE DRUG SCREEN, HOSP PERFORMED
Amphetamines: NOT DETECTED
Barbiturates: NOT DETECTED
Benzodiazepines: POSITIVE — AB
Cocaine: NOT DETECTED
Opiates: NOT DETECTED
Tetrahydrocannabinol: NOT DETECTED

## 2019-08-04 LAB — LACTIC ACID, PLASMA
Lactic Acid, Venous: >11 mmol/L (ref 0.5–1.9)
Lactic Acid, Venous: >11 mmol/L (ref 0.5–1.9)

## 2019-08-04 LAB — ETHANOL: Alcohol, Ethyl (B): <10 mg/dL

## 2019-08-04 LAB — CBG MONITORING, ED
Glucose-Capillary: 117 mg/dL — ABNORMAL HIGH (ref 70–99)
Glucose-Capillary: 114 mg/dL — ABNORMAL HIGH (ref 70–99)

## 2019-08-04 LAB — URINALYSIS, MICROSCOPIC (REFLEX): WBC, UA: >50 WBC/hpf (ref 0–5)

## 2019-08-04 LAB — ABO/RH: ABO/RH(D): B POS

## 2019-08-04 LAB — TYPE AND SCREEN
ABO/RH(D): B POS
Antibody Screen: NEGATIVE

## 2019-08-04 MED ORDER — METRONIDAZOLE IN NACL 5-0.79 MG/ML-% IV SOLN
500.0000 mg | Freq: Once | INTRAVENOUS | Status: AC
  Administered 2019-08-04: 500 mg via INTRAVENOUS
  Filled 2019-08-04: qty 100

## 2019-08-04 MED ORDER — VANCOMYCIN HCL IN DEXTROSE 1-5 GM/200ML-% IV SOLN: 1000.0000 mg | Freq: Once | INTRAVENOUS | Status: DC

## 2019-08-04 MED ORDER — THIAMINE HCL 100 MG/ML IJ SOLN
Freq: Once | INTRAVENOUS | Status: AC
  Administered 2019-08-05: 01:00:00 via INTRAVENOUS
  Filled 2019-08-04: qty 1000

## 2019-08-04 MED ORDER — SODIUM CHLORIDE 0.9 % IV BOLUS
1000.0000 mL | Freq: Once | INTRAVENOUS | Status: AC
  Administered 2019-08-04: 1000 mL via INTRAVENOUS

## 2019-08-04 MED ORDER — SODIUM CHLORIDE 0.9% IV SOLUTION
Freq: Once | INTRAVENOUS | Status: AC
  Administered 2019-08-05: 01:00:00 via INTRAVENOUS

## 2019-08-04 MED ORDER — SODIUM CHLORIDE 0.9 % IV SOLN
2.0000 g | Freq: Once | INTRAVENOUS | Status: AC
  Administered 2019-08-04: 2 g via INTRAVENOUS
  Filled 2019-08-04: qty 2

## 2019-08-04 MED ORDER — PANTOPRAZOLE SODIUM 40 MG IV SOLR
40.0000 mg | Freq: Every day | INTRAVENOUS | Status: DC
  Administered 2019-08-05 – 2019-08-08 (×5): 40 mg via INTRAVENOUS
  Filled 2019-08-04 (×5): qty 40

## 2019-08-04 MED ORDER — HEPARIN SODIUM (PORCINE) 5000 UNIT/ML IJ SOLN: 5000.0000 [IU] | Freq: Three times a day (TID) | INTRAMUSCULAR | Status: DC

## 2019-08-04 MED ORDER — THIAMINE HCL 100 MG/ML IJ SOLN
100.0000 mg | INTRAMUSCULAR | Status: AC
  Administered 2019-08-05 – 2019-08-07 (×3): 100 mg via INTRAVENOUS
  Filled 2019-08-04 (×4): qty 2

## 2019-08-04 MED ORDER — DEXTROSE 10 % IV SOLN
100.0000 mL/h | INTRAVENOUS | Status: DC
  Administered 2019-08-04: 1000 mL via INTRAVENOUS
  Administered 2019-08-05: 100 mL/h via INTRAVENOUS

## 2019-08-04 MED ORDER — SODIUM CHLORIDE 0.9 % IV SOLN
1.0000 g | INTRAVENOUS | Status: DC
  Filled 2019-08-04: qty 1

## 2019-08-04 MED ORDER — SODIUM CHLORIDE 0.9 % IV SOLN
250.0000 mL | INTRAVENOUS | Status: DC
  Administered 2019-08-05 – 2019-08-13 (×6): 250 mL via INTRAVENOUS

## 2019-08-04 MED ORDER — SODIUM CHLORIDE 0.9 % IV SOLN
1.0000 mg | Freq: Every day | INTRAVENOUS | Status: DC
  Administered 2019-08-05 (×2): 1 mg via INTRAVENOUS
  Filled 2019-08-04 (×4): qty 0.2

## 2019-08-04 MED ORDER — VANCOMYCIN HCL 10 G IV SOLR
1250.0000 mg | Freq: Once | INTRAVENOUS | Status: AC
  Administered 2019-08-04: 1250 mg via INTRAVENOUS
  Filled 2019-08-04: qty 1250

## 2019-08-04 MED ORDER — VANCOMYCIN VARIABLE DOSE PER UNSTABLE RENAL FUNCTION (PHARMACIST DOSING): Status: DC

## 2019-08-04 MED ORDER — SODIUM CHLORIDE 0.9 % IV BOLUS
1000.0000 mL | Freq: Once | INTRAVENOUS | Status: AC
  Administered 2019-08-05: 1000 mL via INTRAVENOUS

## 2019-08-04 MED ORDER — NOREPINEPHRINE 4 MG/250ML-% IV SOLN
2.0000 ug/min | INTRAVENOUS | Status: DC
  Administered 2019-08-05: 2 ug/min via INTRAVENOUS
  Filled 2019-08-04 (×3): qty 250

## 2019-08-04 NOTE — ED Notes (Signed)
CBG Results of 114 reported to Phill, RN.

## 2019-08-04 NOTE — H&P (Addendum)
NAMEBlakeney Osborne MRN:  [id number] DOB:  Sep 18, 1961 LOS: 0 ADMISSION DATE:  08/14/2019 DATE OF SERVICE:  08/14/2019  CHIEF COMPLAINT:  Altered mental status   HISTORY & PHYSICAL  History of Present Illness  This 58 y.o. Caucasian female smoker presented to the [facility] Emergency Department via EMS with complaints of altered mental status.  The patient's boyfriend Victoria Osborne "Victoria Osborne")  is at the bedside and reports that he found her lying on the floor at home, unresponsive with approximately 9 empty bottles of vodka.  He reports that she has been visibly jaundiced for over a week, as he saw her about a week ago, when she was jaundiced but mentating at baseline. At the time of clinical interview, the patient is awake, moaning and groaning constantly, uncooperative but protecting her airway.  She is known to be a heavy drinker and has previously experienced alcohol withdrawal during hospitalizations.  The patient's boyfriend states that, while he has assumed that she has liver cirrhosis, he is unaware that the patient has ever been formally diagnosed with any chronic liver disease.  As far as health problems are concerned, he is only aware of heavy vaginal bleeding as her only other health concern.  REVIEW OF SYSTEMS This patient is critically ill and cannot provide additional history nor review of systems due to mental status/unconsciousness.   Past Medical/Surgical/Social/Family History   Past Medical History:  Diagnosis Date   Anxiety    Depression    Restless leg syndrome    Substance abuse ([place])     Past Surgical History:  Procedure Laterality Date   CESAREAN SECTION      Social History   Tobacco Use   Smoking status: Former Smoker   Smokeless tobacco: Never Used  Substance Use Topics   Alcohol use: Yes    Alcohol/week: 10.0 standard drinks    Types: 10 Standard drinks or equivalent per week    Comment: Rare    Family History    Problem Relation Age of Onset   Depression Mother    Diabetes Sister      Procedures:  N/A   Significant Diagnostic Tests:  RUQ ultrasound under way in ER (9/2)   Micro Data:   Results for orders placed or performed in visit on 04/13/13  WET PREP FOR [place], YEAST, CLUE     Status: Abnormal   Collection Time: 04/13/13  9:07 AM   Specimen: Genital  Result Value Ref Range Status   Yeast Wet Prep HPF POC NONE SEEN NONE SEEN Final   Trich, Wet Prep NONE SEEN NONE SEEN Final   Clue Cells Wet Prep HPF POC NONE SEEN NONE SEEN Final   WBC, Wet Prep HPF POC MANY (A) NONE SEEN Final    Comment: MODERATE BACTERIA SEEN (7-12) EPITH. CELLS PER FIELD AMINE NEGATIVE      Antimicrobials:  Cefepime/Flagyl/vancomycin (9/2>>   Interim history/subjective:  N/A   Objective   BP (!) 75/50    Pulse 66    Temp (!) 90.9 F (32.7 C) (Rectal)    Resp 18    Ht 5\' 7"  (1.702 m)    Wt 65.8 kg    SpO2 99%    BMI 22.71 kg/m     Filed Weights   08/03/2019 2100  Weight: 65.8 kg   No intake or output data in the 24 hours ending 08/17/2019 2241      Examination: GENERAL: stuporous, uncooperative, inappropriate safety awareness or jaundiced. No acute  distress. HEAD: normocephalic, atraumatic EYE: PERRLA, EOM intact, jaundiced sclerae THROAT/ORAL CAVITY: Normal dentition. No oral thrush. No exudate. Mucous membranes are moist. No tonsillar enlargement.  NECK: supple, no thyromegaly, no JVD, no lymphadenopathy. Trachea midline. CHEST/LUNG: symmetric in development and expansion. Good air entry. No crackles. No wheezes. HEART: Regular S1 and S2 without murmur, rub or gallop. ABDOMEN: soft, RUQ tenderness, distended. Normoactive bowel sounds. No rebound. No guarding. Hepatomegaly. EXTREMITIES: Edema: Trace. No cyanosis. No clubbing. 2+ DP pulses LYMPHATIC: no cervical/axillary/inguinal lymph nodes appreciated MUSCULOSKELETAL: No point tenderness. No bulk atrophy. Joints: normal inspection.   SKIN:  No rash or lesion.  Jaundice. NEUROLOGIC: Doll's eyes intact. Corneal reflex intact. Spontaneous respirations intact. Cranial nerves II-XII are grossly symmetric and physiologic. Babinski absent. No sensory deficit. Motor: 5/5 @ RUE, 5/5 @ LUE, 5/5 @ RLL,  5/5 @ LLL.  DTR: 2+ @ R biceps, 2+ @ L biceps, 2+ @ R patellar,  2+ @ L patellar. No cerebellar signs. Gait was not assessed.   Resolved Hospital Problem list   N/A   Assessment & Plan:   ASSESSMENT/PLAN:  ASSESSMENT (included in the Hospital Problem List)  Principal Problem:   Septic shock ([place]) Active Problems:   Hypoglycemic shock (HCC)   Hepatorenal syndrome (HCC)   Acute pancreatitis   Hepatic encephalopathy (HCC)   Alcoholism (HCC)   Abnormal LFTs   Lactic acidosis   Coagulopathy (HCC)   Long QT interval   By systems: PULMONARY  No acute issues Titrate supplemental oxygen to maintain SpO2 93+%   CARDIOVASCULAR  Shock/hypotension, multifactorial Vasopressor support, as needed (start with norepinephrine, vasopressin).   RENAL  Elevated creatinine, likely hepatorenal syndrome with overlying acute kidney injury Renal dosing of medications. Avoid nephrotoxic medications.   GASTROINTESTINAL  Decompensated liver disease  GI PROPHYLAXIS: Protonix F/U RUQ [place] results   HEMATOLOGIC  Coagulopathy secondary to decompensated liver disease  DVT PROPHYLAXIS: contraindicated with INR 3   INFECTIOUS  Urinary tract infection  Leukocytosis Continue empiric cefepime/Flagyl/vancomycin   ENDOCRINE  Hypoglycemia with hypoglycemic shock Accuchecks q 1 hr.   NEUROLOGIC  Hepatic encephalopathy  Chronic alcoholism Supplement thiamine, folate. Will need to monitor for alcohol withdrawal. Precedex, as needed for withdrawal symptoms.   PLAN/RECOMMENDATIONS   Admit to ICU under my service (Attending: Renee Pain, MD) with the diagnoses highlighted above in the active Hospital Problem List  (ASSESSMENT).  IV fluids: banana bag x 1 now.  Continue D10 infusion.  NS boluses for BP.     My assessment, plan of care, findings, medications, side effects, etc. were discussed with: nurse and patient's boyfriend (at bedside).  Patient's son Victoria Osborne, [phone]) is next of kin and has been updated by phone.   Best practice:  Diet: NPO Pain/Anxiety/Delirium protocol (if indicated): no VAP protocol (if indicated): no DVT prophylaxis: heparin GI prophylaxis: Protonix Glucose control: not applicable Mobility/Activity: bedrest   Code Status: Full Code Family Communication:  no family at bedside.  Patient's son Victoria Osborne) updated by phone. Disposition:    Labs   CBC: Recent Labs  Lab 08/21/2019 2011  WBC 46.8*  HGB 10.0*  HCT 28.4*  MCV 109.2*  PLT 134*    Basic Metabolic Panel: Recent Labs  Lab 08/11/2019 2011  NA 135  K 4.3  CL 96*  CO2 7*  GLUCOSE 103*  BUN 88*  CREATININE 6.09*  CALCIUM 6.8*   GFR: Estimated Creatinine Clearance: 9.9 mL/min (A) (by C-G formula based on SCr of 6.09 mg/dL (H)). Recent Labs  Lab 08/21/2019  2011  WBC 46.8*  LATICACIDVEN >11.0*    Liver Function Tests: Recent Labs  Lab 08/03/2019 2011  AST 318*  ALT 90*  ALKPHOS 328*  BILITOT 27.1*  PROT 5.2*  ALBUMIN 1.7*   Recent Labs  Lab 08/25/2019 2011  LIPASE 179*   Recent Labs  Lab 08/28/2019 2011  AMMONIA 241*    ABG No results found for: PHART, PCO2ART, PO2ART, HCO3, TCO2, ACIDBASEDEF, O2SAT   Coagulation Profile: Recent Labs  Lab 08/23/2019 2011  INR 3.0*    Cardiac Enzymes: No results for input(s): CKTOTAL, CKMB, CKMBINDEX, TROPONINI in the last 168 hours.  HbA1C: Hgb A1c MFr Bld  Date/Time Value Ref Range Status  02/27/2016 09:30 AM 5.3 <5.7 % Final    Comment:      For the purpose of screening for the presence of diabetes:   <5.7%       Consistent with the absence of diabetes 5.7-6.4 %   Consistent with increased risk for diabetes (prediabetes) >=6.5 %      Consistent with diabetes   This assay result is consistent with a decreased risk of diabetes.   Currently, no consensus exists regarding use of hemoglobin A1c for diagnosis of diabetes in children.   According to American Diabetes Association (ADA) guidelines, hemoglobin A1c <7.0% represents optimal control in non-pregnant diabetic patients. Different metrics may apply to specific patient populations. Standards of Medical Care in Diabetes (ADA).       CBG: Recent Labs  Lab 08/21/2019 2005  GLUCAP 117*     Past Medical History   Past Medical History:  Diagnosis Date   Anxiety    Depression    Substance abuse ([place])       Surgical History    Past Surgical History:  Procedure Laterality Date   CESAREAN SECTION        Social History   Social History   Socioeconomic History   Marital status: Married    Spouse name: Not on file   Number of children: Not on file   Years of education: Not on file   Highest education level: Not on file  Occupational History   Occupation: Pharmacist, hospital  Social Needs   Financial resource strain: Not on file   Food insecurity    Worry: Not on file    Inability: Not on file   Transportation needs    Medical: Not on file    Non-medical: Not on file  Tobacco Use   Smoking status: Former Smoker   Smokeless tobacco: Never Used  Substance and Sexual Activity   Alcohol use: Yes    Alcohol/week: 10.0 standard drinks    Types: 10 Standard drinks or equivalent per week    Comment: Rare   Drug use: No   Sexual activity: Yes    Birth control/protection: Condom  Lifestyle   Physical activity    Days per week: Not on file    Minutes per session: Not on file   Stress: Not on file  Relationships   Social connections    Talks on phone: Not on file    Gets together: Not on file    Attends religious service: Not on file    Active member of club or organization: Not on file    Attends meetings of clubs or  organizations: Not on file    Relationship status: Not on file  Other Topics Concern   Not on file  Social History Narrative   Significant other   Education: [employer]  Exercise: Yes      Family History    Family History  Problem Relation Age of Onset   Depression Mother    Diabetes Sister    family history includes Depression in her mother; Diabetes in her sister.    Allergies No Known Allergies    Current Medications  Current Facility-Administered Medications:    [START ON 08/05/2019] ceFEPIme (MAXIPIME) 1 g in sodium chloride 0.9 % 100 mL IVPB, 1 g, Intravenous, Q24H, Romona Curls, RPH   dextrose 10 % infusion, 100 mL/hr, Intravenous, Continuous, Lajean Saver, MD, Last Rate: 100 mL/hr at 08/27/2019 2036, 1,000 mL at 08/03/2019 2036   metroNIDAZOLE (FLAGYL) IVPB 500 mg, 500 mg, Intravenous, Once, Lajean Saver, MD, Last Rate: 100 mL/hr at 08/24/2019 2151, 500 mg at 08/18/2019 2151   sodium chloride 0.9 % bolus 1,000 mL, 1,000 mL, Intravenous, Once, Lajean Saver, MD   sodium chloride 0.9 % bolus 1,000 mL, 1,000 mL, Intravenous, Once, Lajean Saver, MD   vancomycin (VANCOCIN) 1,250 mg in sodium chloride 0.9 % 250 mL IVPB, 1,250 mg, Intravenous, Once, Romona Curls, [facility], Last Rate: 166.7 mL/hr at 08/09/2019 2218, 1,250 mg at 08/11/2019 2218   [START ON 08/05/2019] vancomycin variable dose per unstable renal function (pharmacist dosing), , Does not apply, See admin instructions, Romona Curls, [facility]  Current Outpatient Medications:    ALPRAZolam (XANAX) 0.5 MG tablet, TAKE 1 TABLET BY MOUTH AT BEDTIME AS NEEDED FOR ANXIETY (Patient taking differently: Take 0.5 mg by mouth as needed for anxiety. ), Disp: 30 tablet, Rfl: 0   FLUoxetine (PROZAC) 40 MG capsule, TAKE 1 CAPSULE (40 MG TOTAL) BY MOUTH DAILY. (Patient taking differently: Take 40 mg by mouth daily. ), Disp: 90 capsule, Rfl: 3   ibuprofen (ADVIL) 800 MG tablet, Take 800 mg by mouth every 6 (six) hours as needed for  pain., Disp: , Rfl:    augmented betamethasone dipropionate (DIPROLENE AF) 0.05 % cream, Apply topically 2 (two) times daily. (Patient not taking: Reported on 08/22/2019), Disp: 30 g, Rfl: 2   azithromycin (ZITHROMAX) 250 MG tablet, Take daily (Patient not taking: Reported on 08/24/2019), Disp: 4 each, Rfl: 0   FLUoxetine (PROZAC) 20 MG tablet, TAKE 3 TABLETS BY MOUTH EVERY DAY (Patient not taking: Reported on 08/03/2019), Disp: 270 tablet, Rfl: 2   HYDROcodone-acetaminophen (NORCO/VICODIN) 5-325 MG tablet, Take 1 tablet by mouth every 4 (four) hours as needed. (Patient not taking: Reported on 08/30/2019), Disp: 10 tablet, Rfl: 0   ondansetron (ZOFRAN ODT) 4 MG disintegrating tablet, Take 1 tablet (4 mg total) by mouth every 8 (eight) hours as needed. (Patient not taking: Reported on 08/28/2019), Disp: 10 tablet, Rfl: 0   Vitamin D, Ergocalciferol, (DRISDOL) 50000 units CAPS capsule, TAKE 1 CAPSULE (50,000 UNITS TOTAL) BY MOUTH EVERY 7 (SEVEN) DAYS. (Patient not taking: Reported on 08/11/2019), Disp: 12 capsule, Rfl: 1   Home Medications  Prior to Admission medications   Medication Sig Start Date End Date Taking? Authorizing Provider  ALPRAZolam (XANAX) 0.5 MG tablet TAKE 1 TABLET BY MOUTH AT BEDTIME AS NEEDED FOR ANXIETY Patient taking differently: Take 0.5 mg by mouth as needed for anxiety.  11/26/18  Yes McVey, Gelene Mink, PA-C  FLUoxetine (PROZAC) 40 MG capsule TAKE 1 CAPSULE (40 MG TOTAL) BY MOUTH DAILY. Patient taking differently: Take 40 mg by mouth daily.  12/10/17  Yes McVey, Gelene Mink, PA-C  ibuprofen (ADVIL) 800 MG tablet Take 800 mg by mouth every 6 (six) hours as needed for pain.  06/30/19  Yes [provider]  augmented betamethasone dipropionate (DIPROLENE AF) 0.05 % cream Apply topically 2 (two) times daily. Patient not taking: Reported on 08/31/2019 12/10/17   McVey, Gelene Mink, PA-C  azithromycin [facility]) 250 MG tablet Take daily Patient not taking: Reported  on 08/24/2019 03/29/18   Isla Pence, MD  FLUoxetine (PROZAC) 20 MG tablet TAKE 3 TABLETS BY MOUTH EVERY DAY Patient not taking: Reported on 08/05/2019 04/24/18   McVey, Gelene Mink, PA-C  HYDROcodone-acetaminophen (NORCO/VICODIN) 5-325 MG tablet Take 1 tablet by mouth every 4 (four) hours as needed. Patient not taking: Reported on 08/14/2019 03/28/18   Isla Pence, MD  ondansetron (ZOFRAN ODT) 4 MG disintegrating tablet Take 1 tablet (4 mg total) by mouth every 8 (eight) hours as needed. Patient not taking: Reported on 08/11/2019 03/28/18   Isla Pence, MD  Vitamin D, Ergocalciferol, (DRISDOL) 50000 units CAPS capsule TAKE 1 CAPSULE (50,000 UNITS TOTAL) BY MOUTH EVERY 7 (SEVEN) DAYS. Patient not taking: Reported on 08/22/2019 05/06/18   McVey, Gelene Mink, PA-C      Critical care time: 120 minutes.  The treatment and management of the patient's condition was required based on the threat of imminent deterioration. This time reflects time spent by the physician evaluating, providing care and managing the critically ill patient's care. The time was spent at the immediate bedside (or on the same floor/unit and dedicated to this patient's care). Time involved in separately billable procedures is NOT included int he critical care time indicated above. Family meeting and update time may be included above if and only if the patient is unable/incompetent to participate in clinical interview and/or decision making, and the discussion was necessary to determining treatment decisions.   Renee Pain, MD Board Certified by the ABIM, [place] Pager: [phone]

## 2019-08-04 NOTE — ED Triage Notes (Signed)
EMS reports that the initial call was for overdose, pt arrived to ems clearly jaundiced, Hxt of alcoholism, 8 btls of vodka seen on site, 99%ra, cbg 20, then 146, now 117, 70/42, 56hr, 96.1*f, d10 250 mls given, pt arrived on seen with epi drip. Pt Aox1.

## 2019-08-04 NOTE — ED Notes (Signed)
Attempted to call report, waiting for call back. Receiving Nurse being reassigned.

## 2019-08-04 NOTE — Progress Notes (Signed)
Pharmacy Antibiotic Note  Victoria Osborne is a 58 y.o. female admitted on 08/14/2019 with infection of unknown source.  Pharmacy has been consulted for vancomycin/cefepime dosing. SCr 6.09 on admit (baseline 0.5-0.8 ~1year ago).  Plan: Cefepime 2g IV x 1; then 1g IV q24h Vancomycin 1250mg  IV x1; Consider vancomycin random level in 48 hrs and assess renal function trend prior to re-dosing vanc Flagyl 500mg  IV x 1 per EDP - f/u if to continue Monitor clinical progress, c/s, renal function F/u de-escalation plan/LOT, vancomycin levels as indicated      No data recorded.  Recent Labs  Lab 08/19/2019 2011  WBC 46.8*    CrCl cannot be calculated (Patient's most recent lab result is older than the maximum 21 days allowed.).    No Known Allergies  Antimicrobials this admission: 9/2 vancomycin >>  9/2 cefepime >>  9/2 flagyl x 1  Dose adjustments this admission:   Microbiology results:   Elicia Lamp, PharmD, BCPS Clinical Pharmacist 08/13/2019 8:58 PM

## 2019-08-04 NOTE — ED Provider Notes (Signed)
[facility] EMERGENCY DEPARTMENT Provider Note   CSN: [id number] Arrival date & time: 08/23/2019  2002     History   Chief Complaint Chief Complaint  Patient presents with  . Altered Mental Status    HPI Victoria Osborne is a 58 y.o. female.     Patient arrives via ems with generalized weakness, confusion and jaundice of uncertain duration - friend had called EMS. EMS found patient to be hypoglycemic with blood sugar 20, and hypotensive with BP 70. EMS gave d50, iv ns bolus, and placed on epi gtt. Hx etoh abuse, with '8 bottles of vodka' in home. Patient not verbally responsive to questions - level 5 caveat.   The history is provided by the patient and the EMS personnel. The history is limited by the condition of the patient.  Altered Mental Status   Past Medical History:  Diagnosis Date  . Anxiety   . Depression   . Substance abuse [facility])     Patient Active Problem List   Diagnosis Date Noted  . Atypical chest pain 03/17/2018  . Abnormal EKG 03/17/2018  . Rhus dermatitis 06/12/2013  . History of alcohol dependence ([place]) 04/18/2013  . Depression 04/01/2013    Past Surgical History:  Procedure Laterality Date  . CESAREAN SECTION       OB History    Gravida  2   Para  1   Term  1   Preterm      AB  1   Living  1     SAB      TAB  1   Ectopic      Multiple      Live Births               Home Medications    Prior to Admission medications   Medication Sig Start Date End Date Taking? Authorizing Provider  ALPRAZolam Duanne Moron) 0.5 MG tablet TAKE 1 TABLET BY MOUTH AT BEDTIME AS NEEDED FOR ANXIETY 11/26/18   McVey, Gelene Mink, PA-C  augmented betamethasone dipropionate (DIPROLENE AF) 0.05 % cream Apply topically 2 (two) times daily. 12/10/17   McVey, Gelene Mink, PA-C  azithromycin (ZITHROMAX) 250 MG tablet Take daily 03/29/18   Isla Pence, MD  FLUoxetine (PROZAC) 20 MG tablet TAKE 3 TABLETS BY MOUTH EVERY DAY  04/24/18   McVey, Gelene Mink, PA-C  FLUoxetine (PROZAC) 40 MG capsule TAKE 1 CAPSULE (40 MG TOTAL) BY MOUTH DAILY. 12/10/17   McVey, Gelene Mink, PA-C  HYDROcodone-acetaminophen (NORCO/VICODIN) 5-325 MG tablet Take 1 tablet by mouth every 4 (four) hours as needed. 03/28/18   Isla Pence, MD  ondansetron (ZOFRAN ODT) 4 MG disintegrating tablet Take 1 tablet (4 mg total) by mouth every 8 (eight) hours as needed. 03/28/18   Isla Pence, MD  Vitamin D, Ergocalciferol, (DRISDOL) 50000 units CAPS capsule TAKE 1 CAPSULE (50,000 UNITS TOTAL) BY MOUTH EVERY 7 (SEVEN) DAYS. 05/06/18   McVey, Gelene Mink, PA-C    Family History Family History  Problem Relation Age of Onset  . Depression Mother   . Diabetes Sister     Social History Social History   Tobacco Use  . Smoking status: Former Research scientist (life sciences)  . Smokeless tobacco: Never Used  Substance Use Topics  . Alcohol use: Yes    Alcohol/week: 10.0 standard drinks    Types: 10 Standard drinks or equivalent per week    Comment: Rare  . Drug use: No     Allergies   Patient has no known  allergies.   Review of Systems Review of Systems  Unable to perform ROS: Patient unresponsive  level 5 caveat   Physical Exam Updated Vital Signs BP (!) 81/56 (BP Location: Right Arm)   Pulse 62   Resp 19   SpO2 99%   Physical Exam Vitals signs and nursing note reviewed.  Constitutional:      General: She is in acute distress.     Appearance: Normal appearance. She is well-developed. She is ill-appearing.  HENT:     Head: Atraumatic.     Nose: Nose normal.     Mouth/Throat:     Mouth: Mucous membranes are moist.  Eyes:     General: Scleral icterus present.     Conjunctiva/sclera: Conjunctivae normal.     Pupils: Pupils are equal, round, and reactive to light.  Neck:     Musculoskeletal: Normal range of motion and neck supple. No neck rigidity or muscular tenderness.     Trachea: No tracheal deviation.  Cardiovascular:      Rate and Rhythm: Normal rate and regular rhythm.     Pulses: Normal pulses.     Heart sounds: Normal heart sounds. No murmur. No friction rub. No gallop.   Pulmonary:     Effort: Pulmonary effort is normal. No respiratory distress.     Breath sounds: Normal breath sounds.  Abdominal:     General: Bowel sounds are normal.     Palpations: Abdomen is soft.     Tenderness: There is no abdominal tenderness. There is no guarding.  Genitourinary:    Comments: No cva tenderness.  Musculoskeletal:        General: No swelling or tenderness.  Skin:    General: Skin is warm and dry.     Coloration: Skin is jaundiced.     Findings: No rash.  Neurological:     Mental Status: She is alert.     Comments: Awake, opens eyes spontaneously, moves bil ext purposefully, does not follow commands. Speech difficult to understand.   Psychiatric:     Comments: Markedly altered.      ED Treatments / Results  Labs (all labs ordered are listed, but only abnormal results are displayed) Results for orders placed or performed during the hospital encounter of 08/13/2019  CBC  Result Value Ref Range   WBC 46.8 (H) 4.0 - 10.5 K/uL   RBC 2.60 (L) 3.87 - 5.11 MIL/uL   Hemoglobin 10.0 (L) 12.0 - 15.0 g/dL   HCT 28.4 (L) 36.0 - 46.0 %   MCV 109.2 (H) 80.0 - 100.0 fL   MCH 38.5 (H) 26.0 - 34.0 pg   MCHC 35.2 30.0 - 36.0 g/dL   RDW 22.1 (H) 11.5 - 15.5 %   Platelets 134 (L) 150 - 400 K/uL   nRBC 1.8 (H) 0.0 - 0.2 %  Comprehensive metabolic panel  Result Value Ref Range   Sodium 135 135 - 145 mmol/L   Potassium 4.3 3.5 - 5.1 mmol/L   Chloride 96 (L) 98 - 111 mmol/L   CO2 7 (L) 22 - 32 mmol/L   Glucose, Bld 103 (H) 70 - 99 mg/dL   BUN 88 (H) 6 - 20 mg/dL   Creatinine, Ser 6.09 (H) 0.44 - 1.00 mg/dL   Calcium 6.8 (L) 8.9 - 10.3 mg/dL   Total Protein 5.2 (L) 6.5 - 8.1 g/dL   Albumin 1.7 (L) 3.5 - 5.0 g/dL   AST 318 (H) 15 - 41 U/L   ALT 90 (H) 0 -  44 U/L   Alkaline Phosphatase 328 (H) 38 - 126 U/L   Total  Bilirubin 27.1 (HH) 0.3 - 1.2 mg/dL   GFR calc non Af Amer 7 (L) >60 mL/min   GFR calc Af Amer 8 (L) >60 mL/min   Anion gap 32 (H) 5 - 15  Protime-INR  Result Value Ref Range   Prothrombin Time 30.5 (H) 11.4 - 15.2 seconds   INR 3.0 (H) 0.8 - 1.2  Urinalysis, Routine w reflex microscopic  Result Value Ref Range   Color, Urine GREEN (A) YELLOW   APPearance CLOUDY (A) CLEAR   Specific Gravity, Urine 1.020 1.005 - 1.030   pH 7.5 5.0 - 8.0   Glucose, UA >=500 (A) NEGATIVE mg/dL   Hgb urine dipstick LARGE (A) NEGATIVE   Bilirubin Urine LARGE (A) NEGATIVE   Ketones, ur 15 (A) NEGATIVE mg/dL   Protein, ur >300 (A) NEGATIVE mg/dL   Nitrite NEGATIVE NEGATIVE   Leukocytes,Ua LARGE (A) NEGATIVE  Lipase, blood  Result Value Ref Range   Lipase 179 (H) 11 - 51 U/L  Ammonia  Result Value Ref Range   Ammonia 241 (H) 9 - 35 umol/L  Lactic acid, plasma  Result Value Ref Range   Lactic Acid, Venous >11.0 (HH) 0.5 - 1.9 mmol/L  Ethanol  Result Value Ref Range   Alcohol, Ethyl (B) <10 <10 mg/dL  Rapid urine drug screen (hospital performed)  Result Value Ref Range   Opiates NONE DETECTED NONE DETECTED   Cocaine NONE DETECTED NONE DETECTED   Benzodiazepines POSITIVE (A) NONE DETECTED   Amphetamines NONE DETECTED NONE DETECTED   Tetrahydrocannabinol NONE DETECTED NONE DETECTED   Barbiturates NONE DETECTED NONE DETECTED  Urinalysis, Microscopic (reflex)  Result Value Ref Range   RBC / HPF 0-5 0 - 5 RBC/hpf   WBC, UA >50 0 - 5 WBC/hpf   Bacteria, UA MANY (A) NONE SEEN   Squamous Epithelial / LPF 0-5 0 - 5   Urine-Other LESS THAN 10 mL OF URINE SUBMITTED   CBG monitoring, ED  Result Value Ref Range   Glucose-Capillary 117 (H) 70 - 99 mg/dL  Type and screen  Result Value Ref Range   ABO/RH(D) B POS    Antibody Screen NEG    Sample Expiration      08/07/2019,2359 Performed at [facility], [address] [place], [place] [address]   ABO/Rh  Result Value Ref Range   ABO/RH(D)       B POS Performed at [facility] [address], [place], [place] [address]    Dg Chest Port 1 View  Result Date: 08/10/2019 CLINICAL DATA:  Drug overdose. EXAM: PORTABLE CHEST 1 VIEW COMPARISON:  Single-view of the chest 03/11/2018. FINDINGS: Lung volumes are low. Lungs are clear. No pneumothorax or pleural effusion heart size is normal. No acute or focal bony abnormality. IMPRESSION: Negative chest. Electronically Signed   By: Inge Rise M.D.   On: 08/25/2019 20:39    EKG EKG Interpretation  Date/Time:  Wednesday August 04 2019 21:18:46 EDT Ventricular Rate:  61 PR Interval:    QRS Duration: 125 QT Interval:  586 QTC Calculation: 591 R Axis:   53 Text Interpretation:  Sinus rhythm Nonspecific T wave abnormality No previous tracing Confirmed by Lajean Saver [phone]) on 08/26/2019 9:23:09 PM   Radiology Dg Chest Port 1 View  Result Date: 08/21/2019 CLINICAL DATA:  Drug overdose. EXAM: PORTABLE CHEST 1 VIEW COMPARISON:  Single-view of the chest 03/11/2018. FINDINGS:  Lung volumes are low. Lungs are clear. No pneumothorax or pleural effusion heart size is normal. No acute or focal bony abnormality. IMPRESSION: Negative chest. Electronically Signed   By: Inge Rise M.D.   On: 08/14/2019 20:39   US Abdomen Limited Ruq  Result Date: 08/14/2019 CLINICAL DATA:  Jaundice EXAM: ULTRASOUND ABDOMEN LIMITED RIGHT UPPER QUADRANT COMPARISON:  CT dated March 28, 2018. FINDINGS: Gallbladder: There is gallbladder wall thickening with the gallbladder wall measuring approximately 1 cm in thickness. The sonographic Percell Miller sign is negative. There are few small gallstones with gallbladder sludge. Common bile duct: Diameter: 5 mm Liver: Insert fat portal vein is patent on color Doppler imaging with normal direction of blood flow towards the liver. Other: None. IMPRESSION: 1. Cholelithiasis with gallbladder wall thickening. There are no additional findings to suggest a diagnosis of acute  cholecystitis. If there is clinical concern for acute cholecystitis, follow-up with HIDA scan is recommended. 2. Hepatic steatosis. Electronically Signed   By: Constance Holster M.D.   On: 08/12/2019 23:12    Procedures Procedures (including critical care time)  Medications Ordered in ED Medications  sodium chloride 0.9 % bolus 1,000 mL (has no administration in time range)  dextrose 10 % infusion (has no administration in time range)     Initial Impression / Assessment and Plan / ED Course  I have reviewed the triage vital signs and the nursing notes.  Pertinent labs & imaging results that were available during my care of the patient were reviewed by me and considered in my medical decision making (see chart for details).  Iv ns bolus. Stat labs. Continuous pulse ox and monitor. Cbg. Iv ns bolus.   Cultures sent.  Ecg. Cxr. Empiric iv abx/broad coverage provided.   Rectal temp low, bair hugger placed (note order in epic placed much later), repeat cbg.   Reviewed nursing notes and prior charts for additional history.   Labs reviewed by me - wbc very high. bil v high, lactate and ammonia very high. Cultures sent. Iv abx given.   CXR reviewed by me - no pna.   covid screen sent for admission.  bp remains low. Additional liter ns iv.  Critical care consulted for admission.  Recheck pt, sl more alert, awake. abd soft nt. Generally weak appearing.   Recheck cbg.  Additional ns bolus as bp remains low, albeit improved from prior.   U/s ordered r/o biliary dilation/choleycstitis.   Critical care team consulted for admission - at bedside.   Pressors per ICU team.    CRITICAL CARE RE: acute liver failure, acute renal failure/AKI, hyperammonemia, elevated lactate, hypotension, uti, gallstones  Performed by: Mirna Mires Total critical care time: 130 minutes Critical care time was exclusive of separately billable procedures and treating other patients. Critical care was  necessary to treat or prevent imminent or life-threatening deterioration. Critical care was time spent personally by me on the following activities: development of treatment plan with patient and/or surrogate as well as nursing, discussions with consultants, evaluation of patient's response to treatment, examination of patient, obtaining history from patient or surrogate, ordering and performing treatments and interventions, ordering and review of laboratory studies, ordering and review of radiographic studies, pulse oximetry and re-evaluation of patient's condition.    Final Clinical Impressions(s) / ED Diagnoses   Final diagnoses:  None    ED Discharge Orders    None       Lajean Saver, MD 08/27/2019 2319

## 2019-08-04 NOTE — ED Notes (Signed)
ED TO INPATIENT HANDOFF REPORT  ED Nurse Name and Phone #: [phone]  S Name/Age/Gender Barry Brunner 58 y.o. female Room/Bed: 033C/033C  Code Status   Code Status: Full Code  Home/SNF/Other Home Patient oriented to: self, Is this baseline? No   Triage Complete: Triage complete  Chief Complaint Hypotensive; Hypoglycemia; AMS  Triage Note EMS reports that the initial call was for overdose, pt arrived to ems clearly jaundiced, Hxt of alcoholism, 8 btls of vodka seen on site, 99%ra, cbg 20, then 146, now 117, 70/42, 56hr, 96.1*f, d10 250 mls given, pt arrived on seen with epi drip. Pt Aox1.    Allergies No Known Allergies  Level of Care/Admitting Diagnosis ED Disposition    ED Disposition Condition [facility] Area: [place] [[id number]]  Level of Care: ICU [6]  Covid Evaluation: Person Under Investigation (PUI)  Diagnosis: Shock [facility]) [id number]  Admitting Physician: Renee Pain [id number]  Attending Physician: Renee Pain [id number]  Estimated length of stay: > 1 week  Certification:: I certify this patient will need inpatient services for at least 2 midnights  PT Class (Do Not Modify): Inpatient [101]  PT Acc Code (Do Not Modify): Private [1]       B Medical/Surgery History Past Medical History:  Diagnosis Date  . Anxiety   . Depression   . Restless leg syndrome   . Substance abuse [facility])    Past Surgical History:  Procedure Laterality Date  . CESAREAN SECTION       A IV Location/Drains/Wounds Patient Lines/Drains/Airways Status   Active Line/Drains/Airways    Name:   Placement date:   Placement time:   Site:   Days:   Peripheral IV 08/12/2019 Left Antecubital   08/10/2019    2007    Antecubital   less than 1   Peripheral IV 08/23/2019 Right Forearm   08/09/2019    2155    Forearm   less than 1          Intake/Output Last 24 hours No intake or output data in the 24 hours ending 08/07/2019  2324  Labs/Imaging Results for orders placed or performed during the hospital encounter of 09/01/2019 (from the past 48 hour(s))  CBG monitoring, ED     Status: Abnormal   Collection Time: 08/19/2019  8:05 PM  Result Value Ref Range   Glucose-Capillary 117 (H) 70 - 99 mg/dL  CBC     Status: Abnormal   Collection Time: 08/10/2019  8:11 PM  Result Value Ref Range   WBC 46.8 (H) 4.0 - 10.5 K/uL   RBC 2.60 (L) 3.87 - 5.11 MIL/uL   Hemoglobin 10.0 (L) 12.0 - 15.0 g/dL   HCT 28.4 (L) 36.0 - 46.0 %   MCV 109.2 (H) 80.0 - 100.0 fL   MCH 38.5 (H) 26.0 - 34.0 pg   MCHC 35.2 30.0 - 36.0 g/dL   RDW 22.1 (H) 11.5 - 15.5 %   Platelets 134 (L) 150 - 400 K/uL   nRBC 1.8 (H) 0.0 - 0.2 %    Comment: Performed at [facility], [address], [place], [place] [address]  Comprehensive metabolic panel     Status: Abnormal   Collection Time: 08/12/2019  8:11 PM  Result Value Ref Range   Sodium 135 135 - 145 mmol/L   Potassium 4.3 3.5 - 5.1 mmol/L   Chloride 96 (L) 98 - 111 mmol/L   CO2 7 (L) 22 - 32 mmol/L   Glucose,  Bld 103 (H) 70 - 99 mg/dL   BUN 88 (H) 6 - 20 mg/dL   Creatinine, Ser 6.09 (H) 0.44 - 1.00 mg/dL   Calcium 6.8 (L) 8.9 - 10.3 mg/dL   Total Protein 5.2 (L) 6.5 - 8.1 g/dL   Albumin 1.7 (L) 3.5 - 5.0 g/dL   AST 318 (H) 15 - 41 U/L    Comment: RESULTS CONFIRMED BY MANUAL DILUTION ICTERIC SPECIMEN    ALT 90 (H) 0 - 44 U/L    Comment: RESULTS CONFIRMED BY MANUAL DILUTION ICTERIC SPECIMEN    Alkaline Phosphatase 328 (H) 38 - 126 U/L   Total Bilirubin 27.1 (HH) 0.3 - 1.2 mg/dL    Comment: ICTERIC SPECIMEN CRITICAL RESULT CALLED TO, READ BACK BY AND VERIFIED WITH: P Dazha Kempa,RN 2114 08/09/2019 WBOND    GFR calc non Af Amer 7 (L) >60 mL/min   GFR calc Af Amer 8 (L) >60 mL/min   Anion gap 32 (H) 5 - 15    Comment: Performed at [facility], [place] [address] [place], [place] [address]  Protime-INR     Status: Abnormal   Collection Time: 08/17/2019  8:11 PM  Result Value Ref Range    Prothrombin Time 30.5 (H) 11.4 - 15.2 seconds   INR 3.0 (H) 0.8 - 1.2    Comment: (NOTE) INR goal varies based on device and disease states. Performed at [facility], [place] [address] [place], [place] [address]   Lipase, blood     Status: Abnormal   Collection Time: 08/24/2019  8:11 PM  Result Value Ref Range   Lipase 179 (H) 11 - 51 U/L    Comment: Performed at [facility] [address] [place], [place] [address]  Ammonia     Status: Abnormal   Collection Time: 08/05/2019  8:11 PM  Result Value Ref Range   Ammonia 241 (H) 9 - 35 umol/L    Comment: Performed at [facility], [place] [address], [place], [place] [address]  Lactic acid, plasma     Status: Abnormal   Collection Time: 08/03/2019  8:11 PM  Result Value Ref Range   Lactic Acid, Venous >11.0 (HH) 0.5 - 1.9 mmol/L    Comment: ICTERUS AT THIS LEVEL MAY AFFECT RESULT CRITICAL RESULT CALLED TO, READ BACK BY AND VERIFIED WITH: Angelena Sole 2116 09/01/2019 WBOND Performed at [facility], [place] [address] [place], [place] [address]   Ethanol     Status: None   Collection Time: 08/07/2019  8:12 PM  Result Value Ref Range   Alcohol, Ethyl (B) <10 <10 mg/dL    Comment: (NOTE) Lowest detectable limit for serum alcohol is 10 mg/dL. For medical purposes only. Performed at [facility], [place] [address], [place], [place] [address]   Type and screen     Status: None   Collection Time: 08/27/2019  8:20 PM  Result Value Ref Range   ABO/RH(D) B POS    Antibody Screen NEG    Sample Expiration      08/07/2019,2359 Performed at [facility], [place] [address] [place], [place] [address]   ABO/Rh     Status: None   Collection Time: 08/16/2019  8:20 PM  Result Value Ref Range   ABO/RH(D)      B POS Performed at [facility] [address], [place], [place] [address]   Urinalysis, Routine w reflex microscopic     Status: Abnormal  Collection Time: 08/13/2019  9:35 PM  Result Value Ref Range    Color, Urine GREEN (A) YELLOW    Comment: BIOCHEMICALS MAY BE AFFECTED BY COLOR   APPearance CLOUDY (A) CLEAR   Specific Gravity, Urine 1.020 1.005 - 1.030   pH 7.5 5.0 - 8.0   Glucose, UA >=500 (A) NEGATIVE mg/dL   Hgb urine dipstick LARGE (A) NEGATIVE   Bilirubin Urine LARGE (A) NEGATIVE   Ketones, ur 15 (A) NEGATIVE mg/dL   Protein, ur >300 (A) NEGATIVE mg/dL   Nitrite NEGATIVE NEGATIVE   Leukocytes,Ua LARGE (A) NEGATIVE    Comment: Performed at [facility] [address] [place], [place] [address]  Rapid urine drug screen (hospital performed)     Status: Abnormal   Collection Time: 09/01/2019  9:35 PM  Result Value Ref Range   Opiates NONE DETECTED NONE DETECTED   Cocaine NONE DETECTED NONE DETECTED   Benzodiazepines POSITIVE (A) NONE DETECTED   Amphetamines NONE DETECTED NONE DETECTED   Tetrahydrocannabinol NONE DETECTED NONE DETECTED   Barbiturates NONE DETECTED NONE DETECTED    Comment: (NOTE) DRUG SCREEN FOR MEDICAL PURPOSES ONLY.  IF CONFIRMATION IS NEEDED FOR ANY PURPOSE, NOTIFY LAB WITHIN 5 DAYS. LOWEST DETECTABLE LIMITS FOR URINE DRUG SCREEN Drug Class                     Cutoff (ng/mL) Amphetamine and metabolites    1000 Barbiturate and metabolites    200 Benzodiazepine                 [id number] Tricyclics and metabolites     300 Opiates and metabolites        300 Cocaine and metabolites        300 THC                            50 Performed at [facility], [place] [address] [place], [place] [address]   Urinalysis, Microscopic (reflex)     Status: Abnormal   Collection Time: 08/26/2019  9:35 PM  Result Value Ref Range   RBC / HPF 0-5 0 - 5 RBC/hpf   WBC, UA >50 0 - 5 WBC/hpf   Bacteria, UA MANY (A) NONE SEEN   Squamous Epithelial / LPF 0-5 0 - 5   Urine-Other LESS THAN 10 mL OF URINE SUBMITTED     Comment: MICROSCOPIC EXAM PERFORMED ON UNCONCENTRATED URINE Performed at [facility], [place] [address] [place], [place] [address]    Dg Chest Port  1 View  Result Date: 08/30/2019 CLINICAL DATA:  Drug overdose. EXAM: PORTABLE CHEST 1 VIEW COMPARISON:  Single-view of the chest 03/11/2018. FINDINGS: Lung volumes are low. Lungs are clear. No pneumothorax or pleural effusion heart size is normal. No acute or focal bony abnormality. IMPRESSION: Negative chest. Electronically Signed   By: Inge Rise M.D.   On: 08/19/2019 20:39   US Abdomen Limited Ruq  Result Date: 08/09/2019 CLINICAL DATA:  Jaundice EXAM: ULTRASOUND ABDOMEN LIMITED RIGHT UPPER QUADRANT COMPARISON:  CT dated March 28, 2018. FINDINGS: Gallbladder: There is gallbladder wall thickening with the gallbladder wall measuring approximately 1 cm in thickness. The sonographic Percell Miller sign is negative. There are few small gallstones with gallbladder sludge. Common bile duct: Diameter: 5 mm Liver: Insert fat portal vein is patent on color Doppler imaging with normal direction of blood flow towards the liver. Other: None. IMPRESSION: 1. Cholelithiasis  with gallbladder wall thickening. There are no additional findings to suggest a diagnosis of acute cholecystitis. If there is clinical concern for acute cholecystitis, follow-up with HIDA scan is recommended. 2. Hepatic steatosis. Electronically Signed   By: Constance Holster M.D.   On: 08/09/2019 23:12    Pending Labs Unresulted Labs (From admission, onward)    Start     Ordered   08/05/19 0500  CBC  Tomorrow morning,   R     08/12/2019 2305   08/05/19 0500  Magnesium  Tomorrow morning,   R     08/03/2019 2305   08/05/19 0500  Phosphorus  Tomorrow morning,   R     08/30/2019 2305   08/05/19 0500  Comprehensive metabolic panel  Tomorrow morning,   R     08/14/2019 2305   08/31/2019 2300  Cortisol  ONCE - STAT,   STAT     08/21/2019 2305   08/20/2019 2300  Brain natriuretic peptide  Once,   STAT     08/07/2019 2305   08/16/2019 2300  D-dimer, quantitative (not at [facility])  Once,   STAT     08/07/2019 2305   08/30/2019 2258  SARS CORONAVIRUS 2 (TAT 6-24 HRS)  Nasopharyngeal Nasopharyngeal Swab  (Asymptomatic/Tier 2 Patients Labs)  Once,   STAT    Question Answer Comment  Is this test for diagnosis or screening Screening   Symptomatic for COVID-19 as defined by CDC No   Hospitalized for COVID-19 No   Admitted to ICU for COVID-19 No   Previously tested for COVID-19 No   Resident in a congregate (group) care setting No   Employed in healthcare setting Yes   Pregnant No      08/12/2019 2305   08/09/2019 2253  HIV antibody (Routine Testing)  Once,   STAT     08/20/2019 2305   08/26/2019 2253  Hemoglobin A1c  Once,   STAT    Comments: To assess prior glycemic control    08/05/2019 2305   08/23/2019 2253  CBC  (heparin)  Once,   STAT    Comments: Baseline for heparin therapy IF NOT ALREADY DRAWN.  Notify MD if PLT < 100 K.    08/27/2019 2305   09/01/2019 2253  Creatinine, serum  (heparin)  Once,   STAT    Comments: Baseline for heparin therapy IF NOT ALREADY DRAWN.    08/13/2019 2305   08/30/2019 2146  Lactic acid, plasma  ONCE - STAT,   STAT     08/29/2019 2146   08/10/2019 2051  Hepatitis panel, acute  ONCE - STAT,   STAT     08/09/2019 2050   08/13/2019 2012  Blood Culture (routine x 2)  BLOOD CULTURE X 2,   STAT     08/14/2019 2011          Vitals/Pain Today's Vitals   08/10/2019 2130 08/29/2019 2139 08/27/2019 2225 08/11/2019 2230  BP: (!) 68/53  (!) 76/50 (!) 75/50  Pulse: 62  63 66  Resp: (!) 22  (!) 21 18  Temp:  (!) 90.9 F (32.7 C)    TempSrc:  Rectal    SpO2: 100%  99% 99%  Weight:      Height:        Isolation Precautions No active isolations  Medications Medications  dextrose 10 % infusion (1,000 mLs Intravenous New Bag/Given 08/08/2019 2036)  vancomycin (VANCOCIN) 1,250 mg in sodium chloride 0.9 % 250 mL IVPB (1,250 mg Intravenous New Bag/Given 08/19/2019  2218)  ceFEPIme (MAXIPIME) 1 g in sodium chloride 0.9 % 100 mL IVPB (has no administration in time range)  vancomycin variable dose per unstable renal function (pharmacist dosing) (has no  administration in time range)  sodium chloride 0.9 % bolus 1,000 mL (has no administration in time range)  pantoprazole (PROTONIX) injection 40 mg (has no administration in time range)  0.9 %  sodium chloride infusion (has no administration in time range)  norepinephrine (LEVOPHED) 4mg  in 232mL premix infusion (has no administration in time range)  sodium chloride 0.9 % 1,000 mL with thiamine 123XX123 mg, folic acid 1 mg, multivitamins adult 10 mL infusion (has no administration in time range)  thiamine (B-1) injection 100 mg (has no administration in time range)  folic acid 1 mg in sodium chloride 0.9 % 50 mL IVPB (has no administration in time range)  sodium chloride 0.9 % bolus 1,000 mL (1,000 mLs Intravenous New Bag/Given 08/26/2019 2036)  sodium chloride 0.9 % bolus 1,000 mL (1,000 mLs Intravenous New Bag/Given 08/07/2019 2100)  ceFEPIme (MAXIPIME) 2 g in sodium chloride 0.9 % 100 mL IVPB (2 g Intravenous New Bag/Given 08/03/2019 2156)  metroNIDAZOLE (FLAGYL) IVPB 500 mg (500 mg Intravenous New Bag/Given 08/10/2019 2151)  sodium chloride 0.9 % bolus 1,000 mL (1,000 mLs Intravenous New Bag/Given 08/10/2019 2309)    Mobility non-ambulatory     Focused Assessments   R Recommendations: See Admitting Provider Note  Report given to:   Additional Notes:

## 2019-08-05 ENCOUNTER — Inpatient Hospital Stay (HOSPITAL_COMMUNITY): Payer: BLUE CROSS/BLUE SHIELD

## 2019-08-05 DIAGNOSIS — R945 Abnormal results of liver function studies: Secondary | ICD-10-CM

## 2019-08-05 DIAGNOSIS — K7201 Acute and subacute hepatic failure with coma: Secondary | ICD-10-CM

## 2019-08-05 DIAGNOSIS — L8990 Pressure ulcer of unspecified site, unspecified stage: Secondary | ICD-10-CM | POA: Insufficient documentation

## 2019-08-05 LAB — BLOOD GAS, ARTERIAL
Acid-base deficit: 18.8 mmol/L — ABNORMAL HIGH (ref 0.0–2.0)
Bicarbonate: 9.3 mmol/L — ABNORMAL LOW (ref 20.0–28.0)
Drawn by: 24610
FIO2: 100
MECHVT: 410 mL
O2 Saturation: 98.7 %
PEEP: 5 cmH2O
Patient temperature: 98.2
RATE: 25 resp/min
pCO2 arterial: 33 mmHg (ref 32.0–48.0)
pH, Arterial: 7.075 — CL (ref 7.350–7.450)
pO2, Arterial: 332 mmHg — ABNORMAL HIGH (ref 83.0–108.0)

## 2019-08-05 LAB — BLOOD CULTURE ID PANEL (REFLEXED)

## 2019-08-05 LAB — COMPREHENSIVE METABOLIC PANEL
ALT: 124 U/L — ABNORMAL HIGH (ref 0–44)
ALT: 161 U/L — ABNORMAL HIGH (ref 0–44)
ALT: 181 U/L — ABNORMAL HIGH (ref 0–44)
AST: 642 U/L — ABNORMAL HIGH (ref 15–41)
AST: 1130 U/L — ABNORMAL HIGH (ref 15–41)
AST: 1353 U/L — ABNORMAL HIGH (ref 15–41)
Albumin: 1.6 g/dL — ABNORMAL LOW (ref 3.5–5.0)
Albumin: 2 g/dL — ABNORMAL LOW (ref 3.5–5.0)
Albumin: 2 g/dL — ABNORMAL LOW (ref 3.5–5.0)
Alkaline Phosphatase: 277 U/L — ABNORMAL HIGH (ref 38–126)
Alkaline Phosphatase: 288 U/L — ABNORMAL HIGH (ref 38–126)
Alkaline Phosphatase: 262 U/L — ABNORMAL HIGH (ref 38–126)
Anion gap: 27 — ABNORMAL HIGH (ref 5–15)
Anion gap: 27 — ABNORMAL HIGH (ref 5–15)
BUN: 79 mg/dL — ABNORMAL HIGH (ref 6–20)
BUN: 73 mg/dL — ABNORMAL HIGH (ref 6–20)
BUN: 71 mg/dL — ABNORMAL HIGH (ref 6–20)
CO2: <7 mmol/L — ABNORMAL LOW (ref 22–32)
CO2: 12 mmol/L — ABNORMAL LOW (ref 22–32)
CO2: 12 mmol/L — ABNORMAL LOW (ref 22–32)
Calcium: 6 mg/dL — CL (ref 8.9–10.3)
Calcium: 6.2 mg/dL — CL (ref 8.9–10.3)
Calcium: 5.9 mg/dL — CL (ref 8.9–10.3)
Chloride: 102 mmol/L (ref 98–111)
Chloride: 102 mmol/L (ref 98–111)
Chloride: 100 mmol/L (ref 98–111)
Creatinine, Ser: 5.58 mg/dL — ABNORMAL HIGH (ref 0.44–1.00)
Creatinine, Ser: 5.28 mg/dL — ABNORMAL HIGH (ref 0.44–1.00)
Creatinine, Ser: 4.93 mg/dL — ABNORMAL HIGH (ref 0.44–1.00)
GFR calc Af Amer: 9 mL/min — ABNORMAL LOW (ref >60)
GFR calc Af Amer: 10 mL/min — ABNORMAL LOW (ref >60)
GFR calc Af Amer: 11 mL/min — ABNORMAL LOW (ref >60)
GFR calc non Af Amer: 8 mL/min — ABNORMAL LOW (ref >60)
GFR calc non Af Amer: 8 mL/min — ABNORMAL LOW (ref >60)
GFR calc non Af Amer: 9 mL/min — ABNORMAL LOW (ref >60)
Glucose, Bld: 116 mg/dL — ABNORMAL HIGH (ref 70–99)
Glucose, Bld: 96 mg/dL (ref 70–99)
Glucose, Bld: 242 mg/dL — ABNORMAL HIGH (ref 70–99)
Potassium: 2.4 mmol/L — CL (ref 3.5–5.1)
Potassium: 2.2 mmol/L — CL (ref 3.5–5.1)
Potassium: 2.6 mmol/L — CL (ref 3.5–5.1)
Sodium: 140 mmol/L (ref 135–145)
Sodium: 141 mmol/L (ref 135–145)
Sodium: 139 mmol/L (ref 135–145)
Total Bilirubin: 25.1 mg/dL (ref 0.3–1.2)
Total Bilirubin: 28.1 mg/dL (ref 0.3–1.2)
Total Bilirubin: 27.5 mg/dL (ref 0.3–1.2)
Total Protein: 4.9 g/dL — ABNORMAL LOW (ref 6.5–8.1)
Total Protein: 5.1 g/dL — ABNORMAL LOW (ref 6.5–8.1)
Total Protein: 4.9 g/dL — ABNORMAL LOW (ref 6.5–8.1)

## 2019-08-05 LAB — GLUCOSE, CAPILLARY
Glucose-Capillary: 102 mg/dL — ABNORMAL HIGH (ref 70–99)
Glucose-Capillary: 108 mg/dL — ABNORMAL HIGH (ref 70–99)
Glucose-Capillary: 114 mg/dL — ABNORMAL HIGH (ref 70–99)
Glucose-Capillary: 116 mg/dL — ABNORMAL HIGH (ref 70–99)
Glucose-Capillary: 119 mg/dL — ABNORMAL HIGH (ref 70–99)
Glucose-Capillary: 171 mg/dL — ABNORMAL HIGH (ref 70–99)
Glucose-Capillary: 181 mg/dL — ABNORMAL HIGH (ref 70–99)
Glucose-Capillary: 211 mg/dL — ABNORMAL HIGH (ref 70–99)
Glucose-Capillary: 83 mg/dL (ref 70–99)

## 2019-08-05 LAB — CBC WITH DIFFERENTIAL/PLATELET
Abs Immature Granulocytes: 0 10*3/uL (ref 0.00–0.07)
Abs Immature Granulocytes: 2.96 10*3/uL — ABNORMAL HIGH (ref 0.00–0.07)
Basophils Absolute: 0 10*3/uL (ref 0.0–0.1)
Basophils Absolute: 0.1 10*3/uL (ref 0.0–0.1)
Basophils Relative: 0 %
Basophils Relative: 0 %
Eosinophils Absolute: 0 10*3/uL (ref 0.0–0.5)
Eosinophils Absolute: 0.1 10*3/uL (ref 0.0–0.5)
Eosinophils Relative: 0 %
Eosinophils Relative: 0 %
HCT: 23 % — ABNORMAL LOW (ref 36.0–46.0)
HCT: 21 % — ABNORMAL LOW (ref 36.0–46.0)
Hemoglobin: 8.1 g/dL — ABNORMAL LOW (ref 12.0–15.0)
Hemoglobin: 7.3 g/dL — ABNORMAL LOW (ref 12.0–15.0)
Immature Granulocytes: 6 %
Lymphocytes Relative: 5 %
Lymphocytes Relative: 4 %
Lymphs Abs: 2.7 10*3/uL (ref 0.7–4.0)
Lymphs Abs: 1.7 10*3/uL (ref 0.7–4.0)
MCH: 38.4 pg — ABNORMAL HIGH (ref 26.0–34.0)
MCH: 37.8 pg — ABNORMAL HIGH (ref 26.0–34.0)
MCHC: 35.2 g/dL (ref 30.0–36.0)
MCHC: 34.8 g/dL (ref 30.0–36.0)
MCV: 109 fL — ABNORMAL HIGH (ref 80.0–100.0)
MCV: 108.8 fL — ABNORMAL HIGH (ref 80.0–100.0)
Monocytes Absolute: 0.5 10*3/uL (ref 0.1–1.0)
Monocytes Absolute: 1.9 10*3/uL — ABNORMAL HIGH (ref 0.1–1.0)
Monocytes Relative: 1 %
Monocytes Relative: 4 %
Neutro Abs: 50.6 10*3/uL — ABNORMAL HIGH (ref 1.7–7.7)
Neutro Abs: 42.5 10*3/uL — ABNORMAL HIGH (ref 1.7–7.7)
Neutrophils Relative %: 94 %
Neutrophils Relative %: 86 %
Platelets: 145 10*3/uL — ABNORMAL LOW (ref 150–400)
Platelets: 111 10*3/uL — ABNORMAL LOW (ref 150–400)
RBC: 2.11 MIL/uL — ABNORMAL LOW (ref 3.87–5.11)
RBC: 1.93 MIL/uL — ABNORMAL LOW (ref 3.87–5.11)
RDW: 21.6 % — ABNORMAL HIGH (ref 11.5–15.5)
RDW: 21.7 % — ABNORMAL HIGH (ref 11.5–15.5)
WBC Morphology: INCREASED
WBC: 53.8 10*3/uL (ref 4.0–10.5)
WBC: 49.1 10*3/uL — ABNORMAL HIGH (ref 4.0–10.5)
nRBC: 4.7 % — ABNORMAL HIGH (ref 0.0–0.2)
nRBC: 4.6 % — ABNORMAL HIGH (ref 0.0–0.2)

## 2019-08-05 LAB — CBC
HCT: 27.1 % — ABNORMAL LOW (ref 36.0–46.0)
HCT: 24.2 % — ABNORMAL LOW (ref 36.0–46.0)
Hemoglobin: 9.1 g/dL — ABNORMAL LOW (ref 12.0–15.0)
Hemoglobin: 8.3 g/dL — ABNORMAL LOW (ref 12.0–15.0)
MCH: 37.6 pg — ABNORMAL HIGH (ref 26.0–34.0)
MCH: 37.9 pg — ABNORMAL HIGH (ref 26.0–34.0)
MCHC: 33.6 g/dL (ref 30.0–36.0)
MCHC: 34.3 g/dL (ref 30.0–36.0)
MCV: 112 fL — ABNORMAL HIGH (ref 80.0–100.0)
MCV: 110.5 fL — ABNORMAL HIGH (ref 80.0–100.0)
Platelets: 107 10*3/uL — ABNORMAL LOW (ref 150–400)
Platelets: 104 10*3/uL — ABNORMAL LOW (ref 150–400)
RBC: 2.42 MIL/uL — ABNORMAL LOW (ref 3.87–5.11)
RBC: 2.19 MIL/uL — ABNORMAL LOW (ref 3.87–5.11)
RDW: 21.9 % — ABNORMAL HIGH (ref 11.5–15.5)
RDW: 21.8 % — ABNORMAL HIGH (ref 11.5–15.5)
WBC: 46.2 10*3/uL — ABNORMAL HIGH (ref 4.0–10.5)
WBC: 47.8 10*3/uL — ABNORMAL HIGH (ref 4.0–10.5)
nRBC: 2.4 % — ABNORMAL HIGH (ref 0.0–0.2)
nRBC: 2.7 % — ABNORMAL HIGH (ref 0.0–0.2)

## 2019-08-05 LAB — RAPID HIV SCREEN (HIV 1/2 AB+AG)
HIV 1/2 Antibodies: NONREACTIVE
HIV-1 P24 Antigen - HIV24: NONREACTIVE

## 2019-08-05 LAB — MAGNESIUM: Magnesium: 1.8 mg/dL (ref 1.7–2.4)

## 2019-08-05 LAB — MRSA PCR SCREENING: MRSA by PCR: NEGATIVE

## 2019-08-05 LAB — POCT I-STAT 7, (LYTES, BLD GAS, ICA,H+H)
Acid-base deficit: 13 mmol/L — ABNORMAL HIGH (ref 0.0–2.0)
Acid-base deficit: 13 mmol/L — ABNORMAL HIGH (ref 0.0–2.0)
Bicarbonate: 14.3 mmol/L — ABNORMAL LOW (ref 20.0–28.0)
Bicarbonate: 12 mmol/L — ABNORMAL LOW (ref 20.0–28.0)
Calcium, Ion: 0.8 mmol/L — CL (ref 1.15–1.40)
Calcium, Ion: 0.77 mmol/L — CL (ref 1.15–1.40)
HCT: 30 % — ABNORMAL LOW (ref 36.0–46.0)
HCT: 24 % — ABNORMAL LOW (ref 36.0–46.0)
Hemoglobin: 10.2 g/dL — ABNORMAL LOW (ref 12.0–15.0)
Hemoglobin: 8.2 g/dL — ABNORMAL LOW (ref 12.0–15.0)
O2 Saturation: 100 %
O2 Saturation: 99 %
Patient temperature: 98.2
Patient temperature: 98.2
Potassium: 2.2 mmol/L — CL (ref 3.5–5.1)
Potassium: 2.7 mmol/L — CL (ref 3.5–5.1)
Sodium: 144 mmol/L (ref 135–145)
Sodium: 140 mmol/L (ref 135–145)
TCO2: 16 mmol/L — ABNORMAL LOW (ref 22–32)
TCO2: 13 mmol/L — ABNORMAL LOW (ref 22–32)
pCO2 arterial: 38.1 mmHg (ref 32.0–48.0)
pCO2 arterial: 24.7 mmHg — ABNORMAL LOW (ref 32.0–48.0)
pH, Arterial: 7.182 — CL (ref 7.350–7.450)
pH, Arterial: 7.295 — ABNORMAL LOW (ref 7.350–7.450)
pO2, Arterial: 405 mmHg — ABNORMAL HIGH (ref 83.0–108.0)
pO2, Arterial: 145 mmHg — ABNORMAL HIGH (ref 83.0–108.0)

## 2019-08-05 LAB — PROTIME-INR
INR: 3 — ABNORMAL HIGH (ref 0.8–1.2)
Prothrombin Time: 30.5 seconds — ABNORMAL HIGH (ref 11.4–15.2)

## 2019-08-05 LAB — RENAL FUNCTION PANEL
Albumin: 2.3 g/dL — ABNORMAL LOW (ref 3.5–5.0)
Anion gap: 24 — ABNORMAL HIGH (ref 5–15)
BUN: 57 mg/dL — ABNORMAL HIGH (ref 6–20)
CO2: 13 mmol/L — ABNORMAL LOW (ref 22–32)
Calcium: 5.7 mg/dL — CL (ref 8.9–10.3)
Chloride: 103 mmol/L (ref 98–111)
Creatinine, Ser: 3.96 mg/dL — ABNORMAL HIGH (ref 0.44–1.00)
GFR calc Af Amer: 14 mL/min — ABNORMAL LOW (ref >60)
GFR calc non Af Amer: 12 mL/min — ABNORMAL LOW (ref >60)
Glucose, Bld: 235 mg/dL — ABNORMAL HIGH (ref 70–99)
Phosphorus: 4.4 mg/dL (ref 2.5–4.6)
Potassium: 2.6 mmol/L — CL (ref 3.5–5.1)
Sodium: 140 mmol/L (ref 135–145)

## 2019-08-05 LAB — LIPASE, BLOOD: Lipase: 104 U/L — ABNORMAL HIGH (ref 11–51)

## 2019-08-05 LAB — PHOSPHORUS: Phosphorus: 7.4 mg/dL — ABNORMAL HIGH (ref 2.5–4.6)

## 2019-08-05 LAB — HEMOGLOBIN A1C
Hgb A1c MFr Bld: 4.7 % — ABNORMAL LOW (ref 4.8–5.6)
Mean Plasma Glucose: 88.19 mg/dL

## 2019-08-05 LAB — SARS CORONAVIRUS 2 (TAT 6-24 HRS): SARS Coronavirus 2: NEGATIVE

## 2019-08-05 LAB — CK: Total CK: 115 U/L (ref 38–234)

## 2019-08-05 LAB — CORTISOL: Cortisol, Plasma: 38.6 ug/dL

## 2019-08-05 LAB — RPR: RPR Ser Ql: NONREACTIVE

## 2019-08-05 LAB — CREATININE, SERUM
Creatinine, Ser: 5.86 mg/dL — ABNORMAL HIGH (ref 0.44–1.00)
GFR calc Af Amer: 9 mL/min — ABNORMAL LOW (ref >60)
GFR calc non Af Amer: 7 mL/min — ABNORMAL LOW (ref >60)

## 2019-08-05 LAB — SODIUM, URINE, RANDOM: Sodium, Ur: 126 mmol/L

## 2019-08-05 LAB — HIV ANTIBODY (ROUTINE TESTING W REFLEX): HIV Screen 4th Generation wRfx: NONREACTIVE

## 2019-08-05 LAB — LACTIC ACID, PLASMA
Lactic Acid, Venous: >11 mmol/L (ref 0.5–1.9)
Lactic Acid, Venous: 10.9 mmol/L (ref 0.5–1.9)

## 2019-08-05 LAB — BRAIN NATRIURETIC PEPTIDE: B Natriuretic Peptide: 824.4 pg/mL — ABNORMAL HIGH (ref 0.0–100.0)

## 2019-08-05 LAB — D-DIMER, QUANTITATIVE: D-Dimer, Quant: 3.81 ug/mL-FEU — ABNORMAL HIGH (ref 0.00–0.50)

## 2019-08-05 LAB — CREATININE, URINE, RANDOM: Creatinine, Urine: 49.92 mg/dL

## 2019-08-05 MED ORDER — SODIUM CHLORIDE 0.9 % IV SOLN
2.0000 g | Freq: Two times a day (BID) | INTRAVENOUS | Status: DC
  Administered 2019-08-05 – 2019-08-06 (×2): 2 g via INTRAVENOUS
  Filled 2019-08-05 (×2): qty 2

## 2019-08-05 MED ORDER — PHENYLEPHRINE HCL-NACL 40-0.9 MG/250ML-% IV SOLN
0.0000 ug/min | INTRAVENOUS | Status: DC
  Administered 2019-08-05: 10 ug/min via INTRAVENOUS
  Administered 2019-08-06: 50 ug/min via INTRAVENOUS
  Administered 2019-08-06: 40 ug/min via INTRAVENOUS
  Filled 2019-08-05 (×3): qty 250

## 2019-08-05 MED ORDER — FENTANYL BOLUS VIA INFUSION
50.0000 ug | INTRAVENOUS | Status: DC | PRN
  Filled 2019-08-05: qty 50

## 2019-08-05 MED ORDER — SODIUM BICARBONATE 8.4 % IV SOLN
100.0000 meq | Freq: Once | INTRAVENOUS | Status: AC
  Administered 2019-08-05: 100 meq via INTRAVENOUS

## 2019-08-05 MED ORDER — VASOPRESSIN 20 UNIT/ML IV SOLN
0.0400 [IU]/min | INTRAVENOUS | Status: DC
  Administered 2019-08-05 – 2019-08-11 (×9): 0.04 [IU]/min via INTRAVENOUS
  Filled 2019-08-05 (×9): qty 2

## 2019-08-05 MED ORDER — NOREPINEPHRINE 16 MG/250ML-% IV SOLN
0.0000 ug/min | INTRAVENOUS | Status: DC
  Administered 2019-08-05: 40 ug/min via INTRAVENOUS
  Administered 2019-08-06: 02:00:00 30 ug/min via INTRAVENOUS
  Administered 2019-08-06: 35 ug/min via INTRAVENOUS
  Administered 2019-08-06: 40 ug/min via INTRAVENOUS
  Administered 2019-08-07: 28 ug/min via INTRAVENOUS
  Administered 2019-08-07: 40 ug/min via INTRAVENOUS
  Administered 2019-08-07: 26 ug/min via INTRAVENOUS
  Administered 2019-08-08: 17 ug/min via INTRAVENOUS
  Administered 2019-08-08: 25 ug/min via INTRAVENOUS
  Administered 2019-08-09: 20 ug/min via INTRAVENOUS
  Administered 2019-08-10: 15 ug/min via INTRAVENOUS
  Administered 2019-08-13: 11 ug/min via INTRAVENOUS
  Administered 2019-08-14: 9.5 ug/min via INTRAVENOUS
  Filled 2019-08-05 (×17): qty 250

## 2019-08-05 MED ORDER — ROCURONIUM BROMIDE 50 MG/5ML IV SOLN
50.0000 mg | Freq: Once | INTRAVENOUS | Status: AC
  Administered 2019-08-05: 50 mg via INTRAVENOUS
  Filled 2019-08-05: qty 5

## 2019-08-05 MED ORDER — MIDAZOLAM HCL 2 MG/2ML IJ SOLN
1.0000 mg | INTRAMUSCULAR | Status: DC | PRN
  Administered 2019-08-05 – 2019-08-07 (×3): 2 mg via INTRAVENOUS
  Filled 2019-08-05 (×4): qty 2

## 2019-08-05 MED ORDER — NOREPINEPHRINE 4 MG/250ML-% IV SOLN
0.0000 ug/min | INTRAVENOUS | Status: DC
  Administered 2019-08-05: 40 ug/min via INTRAVENOUS

## 2019-08-05 MED ORDER — ALBUMIN HUMAN 25 % IV SOLN
25.0000 g | Freq: Four times a day (QID) | INTRAVENOUS | Status: AC
  Administered 2019-08-05 (×2): 25 g via INTRAVENOUS
  Administered 2019-08-05: 12.5 g via INTRAVENOUS
  Administered 2019-08-06: 25 g via INTRAVENOUS
  Filled 2019-08-05: qty 100
  Filled 2019-08-05: qty 50
  Filled 2019-08-05: qty 150
  Filled 2019-08-05: qty 50

## 2019-08-05 MED ORDER — PRISMASOL BGK 4/2.5 32-4-2.5 MEQ/L IV SOLN
INTRAVENOUS | Status: DC
  Administered 2019-08-05 – 2019-08-15 (×65): via INTRAVENOUS_CENTRAL
  Filled 2019-08-05 (×79): qty 5000

## 2019-08-05 MED ORDER — FENTANYL CITRATE (PF) 100 MCG/2ML IJ SOLN
100.0000 ug | Freq: Once | INTRAMUSCULAR | Status: AC
  Administered 2019-08-05: 100 ug via INTRAVENOUS

## 2019-08-05 MED ORDER — PRISMASOL BGK 4/2.5 32-4-2.5 MEQ/L REPLACEMENT SOLN
Status: DC
  Administered 2019-08-05 – 2019-08-14 (×17): via INTRAVENOUS_CENTRAL
  Filled 2019-08-05 (×20): qty 5000

## 2019-08-05 MED ORDER — FENTANYL 2500MCG IN NS 250ML (10MCG/ML) PREMIX INFUSION
50.0000 ug/h | INTRAVENOUS | Status: DC
  Administered 2019-08-05: 50 ug/h via INTRAVENOUS
  Filled 2019-08-05: qty 250

## 2019-08-05 MED ORDER — VANCOMYCIN HCL IN DEXTROSE 750-5 MG/150ML-% IV SOLN
750.0000 mg | INTRAVENOUS | Status: DC
  Administered 2019-08-06: 01:00:00 750 mg via INTRAVENOUS
  Filled 2019-08-05: qty 150

## 2019-08-05 MED ORDER — METHYLPREDNISOLONE SODIUM SUCC 125 MG IJ SOLR
60.0000 mg | Freq: Two times a day (BID) | INTRAMUSCULAR | Status: DC
  Administered 2019-08-05 – 2019-08-10 (×11): 60 mg via INTRAVENOUS
  Filled 2019-08-05 (×11): qty 2

## 2019-08-05 MED ORDER — HEPARIN SODIUM (PORCINE) 1000 UNIT/ML DIALYSIS
1000.0000 [IU] | INTRAMUSCULAR | Status: DC | PRN
  Filled 2019-08-05: qty 6

## 2019-08-05 MED ORDER — CHLORHEXIDINE GLUCONATE CLOTH 2 % EX PADS
6.0000 | MEDICATED_PAD | Freq: Every day | CUTANEOUS | Status: DC
  Administered 2019-08-06 – 2019-08-12 (×7): 6 via TOPICAL

## 2019-08-05 MED ORDER — LORAZEPAM 2 MG/ML IJ SOLN
2.0000 mg | Freq: Once | INTRAMUSCULAR | Status: AC
  Administered 2019-08-05: 2 mg via INTRAVENOUS

## 2019-08-05 MED ORDER — DEXMEDETOMIDINE HCL IN NACL 400 MCG/100ML IV SOLN
0.4000 ug/kg/h | INTRAVENOUS | Status: DC
  Administered 2019-08-05: 0.6 ug/kg/h via INTRAVENOUS
  Administered 2019-08-05: 1 ug/kg/h via INTRAVENOUS
  Filled 2019-08-05 (×2): qty 100

## 2019-08-05 MED ORDER — LORAZEPAM 2 MG/ML IJ SOLN
INTRAMUSCULAR | Status: AC
  Filled 2019-08-05: qty 1

## 2019-08-05 MED ORDER — ETOMIDATE 2 MG/ML IV SOLN
20.0000 mg | Freq: Once | INTRAVENOUS | Status: AC
  Administered 2019-08-05: 20 mg via INTRAVENOUS

## 2019-08-05 MED ORDER — PRISMASOL BGK 4/2.5 32-4-2.5 MEQ/L REPLACEMENT SOLN
Status: DC
  Administered 2019-08-05 – 2019-08-14 (×14): via INTRAVENOUS_CENTRAL
  Filled 2019-08-05 (×18): qty 5000

## 2019-08-05 MED ORDER — VITAL AF 1.2 CAL PO LIQD: 1000.0000 mL | ORAL | Status: DC

## 2019-08-05 MED ORDER — POTASSIUM CHLORIDE 10 MEQ/50ML IV SOLN
10.0000 meq | INTRAVENOUS | Status: AC
  Administered 2019-08-05 (×3): 10 meq via INTRAVENOUS
  Filled 2019-08-05 (×3): qty 50

## 2019-08-05 MED ORDER — PHENYLEPHRINE HCL-NACL 10-0.9 MG/250ML-% IV SOLN: 0.0000 ug/min | INTRAVENOUS | Status: DC

## 2019-08-05 MED ORDER — NOREPINEPHRINE 4 MG/250ML-% IV SOLN
INTRAVENOUS | Status: AC
  Filled 2019-08-05: qty 250

## 2019-08-05 MED ORDER — SODIUM BICARBONATE-DEXTROSE 150-5 MEQ/L-% IV SOLN
150.0000 meq | INTRAVENOUS | Status: DC
  Administered 2019-08-05 – 2019-08-07 (×6): 150 meq via INTRAVENOUS
  Filled 2019-08-05 (×8): qty 1000

## 2019-08-05 MED ORDER — SODIUM BICARBONATE 8.4 % IV SOLN
50.0000 meq | Freq: Once | INTRAVENOUS | Status: AC
  Administered 2019-08-05: 50 meq via INTRAVENOUS

## 2019-08-05 MED ORDER — SODIUM CHLORIDE 0.9% IV SOLUTION
Freq: Once | INTRAVENOUS | Status: AC
  Administered 2019-08-05: 08:00:00 via INTRAVENOUS

## 2019-08-05 MED ORDER — CHLORHEXIDINE GLUCONATE 0.12% ORAL RINSE (MEDLINE KIT)
15.0000 mL | Freq: Two times a day (BID) | OROMUCOSAL | Status: DC
  Administered 2019-08-05 – 2019-08-13 (×15): 15 mL via OROMUCOSAL

## 2019-08-05 MED ORDER — VITAL HIGH PROTEIN PO LIQD: 1000.0000 mL | ORAL | Status: DC

## 2019-08-05 MED ORDER — FENTANYL CITRATE (PF) 100 MCG/2ML IJ SOLN
50.0000 ug | INTRAMUSCULAR | Status: DC | PRN
  Administered 2019-08-05 (×2): 50 ug via INTRAVENOUS
  Filled 2019-08-05 (×3): qty 2

## 2019-08-05 MED ORDER — ROCURONIUM BROMIDE 50 MG/5ML IV SOLN: 80.0000 mg | Freq: Once | INTRAVENOUS | Status: DC

## 2019-08-05 MED ORDER — HEPARIN SODIUM (PORCINE) 1000 UNIT/ML DIALYSIS
1000.0000 [IU] | INTRAMUSCULAR | Status: DC | PRN
  Administered 2019-08-05: 2400 [IU] via INTRAVENOUS_CENTRAL
  Filled 2019-08-05: qty 6
  Filled 2019-08-05: qty 3
  Filled 2019-08-05: qty 6

## 2019-08-05 MED ORDER — CALCIUM GLUCONATE-NACL 2-0.675 GM/100ML-% IV SOLN
2.0000 g | Freq: Once | INTRAVENOUS | Status: AC
  Administered 2019-08-05: 2000 mg via INTRAVENOUS
  Filled 2019-08-05: qty 100

## 2019-08-05 MED ORDER — FOLIC ACID 5 MG/ML IJ SOLN
1.0000 mg | Freq: Every day | INTRAMUSCULAR | Status: DC
  Administered 2019-08-06 – 2019-08-09 (×4): 1 mg via INTRAVENOUS
  Filled 2019-08-05 (×4): qty 0.2

## 2019-08-05 MED ORDER — FENTANYL CITRATE (PF) 100 MCG/2ML IJ SOLN
INTRAMUSCULAR | Status: AC
  Filled 2019-08-05: qty 2

## 2019-08-05 MED ORDER — SODIUM BICARBONATE 8.4 % IV SOLN
100.0000 meq | Freq: Once | INTRAVENOUS | Status: AC
  Administered 2019-08-05: 100 meq via INTRAVENOUS
  Filled 2019-08-05: qty 100

## 2019-08-05 MED ORDER — CALCIUM GLUCONATE-NACL 1-0.675 GM/50ML-% IV SOLN
1.0000 g | Freq: Once | INTRAVENOUS | Status: AC
  Administered 2019-08-05: 1000 mg via INTRAVENOUS
  Filled 2019-08-05: qty 50

## 2019-08-05 MED ORDER — FENTANYL CITRATE (PF) 100 MCG/2ML IJ SOLN: 50.0000 ug | Freq: Once | INTRAMUSCULAR | Status: DC

## 2019-08-05 MED ORDER — POTASSIUM CHLORIDE 10 MEQ/50ML IV SOLN
10.0000 meq | INTRAVENOUS | Status: AC
  Administered 2019-08-05 – 2019-08-06 (×6): 10 meq via INTRAVENOUS
  Filled 2019-08-05 (×6): qty 50

## 2019-08-05 MED ORDER — POTASSIUM CHLORIDE 10 MEQ/100ML IV SOLN
10.0000 meq | INTRAVENOUS | Status: AC
  Administered 2019-08-05 (×6): 10 meq via INTRAVENOUS
  Filled 2019-08-05 (×6): qty 100

## 2019-08-05 MED ORDER — ORAL CARE MOUTH RINSE
15.0000 mL | OROMUCOSAL | Status: DC
  Administered 2019-08-05 – 2019-08-13 (×77): 15 mL via OROMUCOSAL

## 2019-08-05 NOTE — Procedures (Signed)
Arterial Catheter Insertion Procedure Note Victoria Osborne [id number] [birth year]  Procedure: Insertion of Arterial Catheter  Indications: Blood pressure monitoring  Procedure Details Consent: Unable to obtain consent because of altered level of consciousness. Time Out: Verified patient identification, verified procedure, site/side was marked, verified correct patient position, special equipment/implants available, medications/allergies/relevent history reviewed, required imaging and test results available.  Performed  Maximum sterile technique was used including antiseptics, cap, gloves, gown, hand hygiene, mask and sheet. Skin prep: Chlorhexidine; local anesthetic administered 20 gauge catheter was inserted into left radial artery using the Seldinger technique. ULTRASOUND GUIDANCE USED: NO Evaluation Blood flow good; BP tracing good. Complications: No apparent complications.   [name] 08/05/2019

## 2019-08-05 NOTE — Progress Notes (Signed)
Pharmacy Antibiotic Note  Victoria Osborne is a 58 y.o. female admitted on 08/07/2019 with septic shock of unknown source. Pharmacy has been consulted for vancomycin/cefepime dosing for CRRT.   WBC elevated 47.8, Afebrile, LA remains >11. Pt aspirated 600 ml bile-like fluid during intubation, no anaerobic coverage per MD.   Plan: Vancomycin 750 mg IV q24h starting 9/4 @0000  Cefepime 2 gm IV q12h starting 9/3 @2200  Vanc random as needed F/u cxs, clinical improvement, and abx de-escalation to ceftriaxone after r/o other sources of infection  Height: 5\' 7"  (170.2 cm) Weight: 160 lb 15 oz (73 kg) IBW/kg (Calculated) : 61.6  Temp (24hrs), Avg:95.8 F (35.4 C), Min:90.9 F (32.7 C), Max:98.7 F (37.1 C)  Recent Labs  Lab 08/07/2019 2011 08/03/2019 2252 08/23/2019 2305 08/05/19 0255 08/05/19 0441 08/05/19 1010 08/05/19 1513  WBC 46.8*  --  46.2* 47.8*  --  53.8* 49.1*  CREATININE 6.09*  --  5.86* 5.58*  --  5.28* 4.93*  LATICACIDVEN >11.0* >11.0*  --   --  >11.0*  --  10.9*    Estimated Creatinine Clearance: 12.2 mL/min (A) (by C-G formula based on SCr of 4.93 mg/dL (H)).    No Known Allergies  Antimicrobials this admission: 9/2 vancomycin >>  9/2 cefepime >>  9/2 flagyl x 1  Microbiology results: 9/2 Bcx: Kleb. pneumo 9/2 MRSA PCR: neg 9/3 UCx: sent  Berenice Bouton, PharmD PGY1 Pharmacy Resident Office phone: [phone] 08/05/2019 4:27 PM

## 2019-08-05 NOTE — Progress Notes (Signed)
 Initial Nutrition Assessment  DOCUMENTATION CODES:   Not applicable  INTERVENTION:   Tube Feeding:  Initiate trickle TF of Vital AF 1.2 at 20 ml/hr If tolerates trickle TF, recommend titrating TF to goal rate starting tomorrow Goal rate: Vital AF 1.2 at 55 ml/hr Pro-Stat 30 mL BID Provides 1640 kcals, 120 g of protein and 972 mL of free water Meets 100% of estimated calorie and protein needs at goal rate   NUTRITION DIAGNOSIS:   Inadequate oral intake related to acute illness as evidenced by NPO status.  GOAL:   Patient will meet greater than or equal to 90% of their needs  MONITOR:   TF tolerance, Vent status, Labs, Weight trends  REASON FOR ASSESSMENT:   Ventilator    ASSESSMENT:   58 yo female admitted with severe alcoholic hepatitis, acute respiratory failure with inability to protect airway requiring intubation, shock requiring pressors, AKI with acidosis,  PMH EtOH abuse, anxiety, depression   Tri-alysis cath today with initiation of CRRT Patient is currently intubated on ventilator support MV: 10.3 L/min Temp (24hrs), Avg:94.8 F (34.9 C), Min:90.9 F (32.7 C), Max:98.6 F (37 C)  Unable to obtain diet and weight history; no weight encounters over the past year  Abdominal xray with tip of OG tube in stomach  OG with 600 mL dark bilious fluid post placement  Hyperphosphatemia should improve with initiation of CRRT, hypokalemia being addressed  Labs: potassium 2.4 (L), phosphorus 7.4 (H), Creatinine 5.58, BUN 79 Meds:D10 at 123XX123 ml/hr, folic acid, solumedrol, thiamine  NUTRITION - FOCUSED PHYSICAL EXAM:  Unable to assess today   Diet Order:   Diet Order            Diet NPO time specified  Diet effective now              EDUCATION NEEDS:   Not appropriate for education at this time  Skin:  Skin Assessment: Skin Integrity Issues: Skin Integrity Issues:: Stage I, DTI DTI: heel Stage I: sacrum  Last BM:  no documented BM  Height:    Ht Readings from Last 1 Encounters:  08/08/2019 5\' 7"  (1.702 m)    Weight:   Wt Readings from Last 1 Encounters:  08/05/19 73 kg    Ideal Body Weight:  61.4 kg  BMI:  Body mass index is 25.21 kg/m.  Estimated Nutritional Needs:   Kcal:  1584 kcals  Protein:  110-135 g  Fluid:  >/= 1.5 L     Breeanna Galgano MS, RDN, LDN, CNSC [phone] Pager  [phone] Weekend/On-Call Pager

## 2019-08-05 NOTE — Progress Notes (Signed)
CRITICAL VALUE ALERT  Critical Value:  Potassium 2.4, Calcium 6.0, Total Bili 25.1  Date & Time Notied:  08/05/19 4:04 AM   Provider Notified: Dr. Jimmy Footman  Orders Received/Actions taken: awaiting orders

## 2019-08-05 NOTE — Procedures (Signed)
Central Venous Catheter Insertion Procedure Note Victoria Osborne [id number] [birth year]  Procedure: Insertion of Central Venous Catheter Indications: Assessment of intravascular volume, Drug and/or fluid administration and Frequent blood sampling  Procedure Details Consent: Unable to obtain consent because of altered level of consciousness. Time Out: Verified patient identification, verified procedure, site/side was marked, verified correct patient position, special equipment/implants available, medications/allergies/relevent history reviewed, required imaging and test results available.  Performed  Maximum sterile technique was used including antiseptics, cap, gloves, gown, hand hygiene, mask and sheet. Skin prep: Chlorhexidine; local anesthetic administered A antimicrobial bonded/coated triple lumen catheter was placed in the left internal jugular vein using the Seldinger technique.  Evaluation Blood flow good Complications: No apparent complications Patient did tolerate procedure well. Chest X-ray ordered to verify placement.  CXR: pending.  Procedure performed under direct ultrasound guidance for real time vessel cannulation.      Montey Hora, [facility] Pager: [phone]  or [phone] 08/05/2019, 6:52 AM

## 2019-08-05 NOTE — Procedures (Signed)
OGT placement Indication: enteral access in vented patientt Using larygoscope advanced OGT down esophagus under direct visualization 600cc dark bilious fluid evacuated KUB confirmed placement No complications

## 2019-08-05 NOTE — Progress Notes (Signed)
PHARMACY - PHYSICIAN COMMUNICATION CRITICAL VALUE ALERT - BLOOD CULTURE IDENTIFICATION (BCID)  Victoria Osborne is an 58 y.o. female who presented to [facility] on 08/[facility]/2019 with a chief complaint of AMS.  Assessment:  This patient has 1/2 blood cultures positive for gram negative rods of the Enterobacteriaceae family, BCID shows Klebsiella penumoniae with no resistance.    Name of physician (or Provider) Contacted: Maryan Rued, PharmD to relay message.  Current antibiotics: Cefepime  Changes to prescribed antibiotics recommended:  Recommend de-escalation from cefepime to ceftriaxone 2g IV q24hr.   Results for orders placed or performed during the hospital encounter of 08/12/2019  Blood Culture ID Panel (Reflexed) (Collected: 08/31/2019  8:47 PM)  Result Value Ref Range   Enterococcus species NOT DETECTED NOT DETECTED   Listeria monocytogenes NOT DETECTED NOT DETECTED   Staphylococcus species NOT DETECTED NOT DETECTED   Staphylococcus aureus (BCID) NOT DETECTED NOT DETECTED   Streptococcus species NOT DETECTED NOT DETECTED   Streptococcus agalactiae NOT DETECTED NOT DETECTED   Streptococcus pneumoniae NOT DETECTED NOT DETECTED   Streptococcus pyogenes NOT DETECTED NOT DETECTED   Acinetobacter baumannii NOT DETECTED NOT DETECTED   Enterobacteriaceae species DETECTED (A) NOT DETECTED   Enterobacter cloacae complex NOT DETECTED NOT DETECTED   Escherichia coli NOT DETECTED NOT DETECTED   Klebsiella oxytoca NOT DETECTED NOT DETECTED   Klebsiella pneumoniae DETECTED (A) NOT DETECTED   Proteus species NOT DETECTED NOT DETECTED   Serratia marcescens NOT DETECTED NOT DETECTED   Carbapenem resistance NOT DETECTED NOT DETECTED   Haemophilus influenzae NOT DETECTED NOT DETECTED   Neisseria meningitidis NOT DETECTED NOT DETECTED   Pseudomonas aeruginosa NOT DETECTED NOT DETECTED   Candida albicans NOT DETECTED NOT DETECTED   Candida glabrata NOT DETECTED NOT DETECTED   Candida krusei NOT  DETECTED NOT DETECTED   Candida parapsilosis NOT DETECTED NOT DETECTED   Candida tropicalis NOT DETECTED NOT DETECTED    Wallene Huh 08/05/2019  2:20 PM

## 2019-08-05 NOTE — Procedures (Signed)
Intubation Procedure Note Victoria Osborne [id number] 02/05/1961  Procedure: Intubation Indications: Airway protection and maintenance  Procedure Details Consent: Unable to obtain consent because of emergent medical necessity. Time Out: Verified patient identification, verified procedure, site/side was marked, verified correct patient position, special equipment/implants available, medications/allergies/relevent history reviewed, required imaging and test results available.  Performed  Maximum sterile technique was used including antiseptics, cap, gloves, gown, hand hygiene, mask and sheet.  4    Evaluation Hemodynamic Status: BP stable throughout; O2 sats: stable throughout Patient's Current Condition: unstable Complications: No apparent complications Patient did tolerate procedure well. Chest X-ray ordered to verify placement.  CXR: tube position acceptable.   [name] 08/05/2019

## 2019-08-05 NOTE — Progress Notes (Signed)
Chaplain was present on unit at 1100 for staff huddle and nurse informed chaplain of Nikitta's condition. Chaplain stopped outside pt's room to observe the pt's condition and if any family were present. Physician and nurses were in a procedure with the pt. Chaplain said a silent prayer for pt and moved on. Chaplain will visit again if family needs support.  Chaplain Resident, Evelene Croon, M. Div.  Pager # [phone]

## 2019-08-05 NOTE — Progress Notes (Signed)
RN noted HR around 40's and 50's despite off sedation. E-link MD notified. Orders given to continue to monitor.

## 2019-08-05 NOTE — Progress Notes (Signed)
CRITICAL VALUE ALERT  Critical Value: Potassium and Calcium   Date & Time Notied: 08/05/19 19:45   Provider Notified: E-Link RN.   Orders Received/Actions taken: RN will notify MD. Waiting for orders

## 2019-08-05 NOTE — Consult Note (Signed)
[facility]  INPATIENT CONSULTATION  Reason for Consultation: AKI Requesting Provider: Dr. Ina Homes  HPI: Victoria Osborne is an 58 y.o. female with h/o EtOH use who is being seen for evaluation and management of AKI.   She presented to ED last night after being found unresponsive (surrounded by vodka bottles) on 1 wk history of jaundice.  She's been admitted to ICU and is requiring vasopressor and ventilatory support.  Labs c/w severe alcoholic hepatitis with coagulopathy.  She's being treated with IV steroids, broad spectrum antibiotics (vanc, cefepime) as cultures are pending.   I/Os +5.6L since presentation, no UOP.   Given worsening acidosis, oliguric AKI support with CRRT is requested.   PMH: Past Medical History:  Diagnosis Date  . Anxiety   . Depression   . Restless leg syndrome   . Substance abuse ([place])    PSH: Past Surgical History:  Procedure Laterality Date  . CESAREAN SECTION     Past Medical History:  Diagnosis Date  . Anxiety   . Depression   . Restless leg syndrome   . Substance abuse ([place])     Medications:  I have reviewed the patient's current medications. NE, phenyl, vaso gtts Precedex, fentanyl Bicarb gtt vanc + cefepime IV KCL repletion Albumin 25g q6h   Medications Prior to Admission  Medication Sig Dispense Refill  . ALPRAZolam (XANAX) 0.5 MG tablet TAKE 1 TABLET BY MOUTH AT BEDTIME AS NEEDED FOR ANXIETY (Patient taking differently: Take 0.5 mg by mouth as needed for anxiety. ) 30 tablet 0  . calcium carbonate (TUMS - DOSED IN MG ELEMENTAL CALCIUM) 500 MG chewable tablet Chew 1 tablet by mouth 2 (two) times daily as needed for indigestion or heartburn.     Marland Kitchen FLUoxetine (PROZAC) 40 MG capsule TAKE 1 CAPSULE (40 MG TOTAL) BY MOUTH DAILY. (Patient taking differently: Take 40 mg by mouth daily. ) 90 capsule 3  . ibuprofen (ADVIL) 800 MG tablet Take 800 mg by mouth every 6 (six) hours as needed for pain.    Marland Kitchen omeprazole  (PRILOSEC) 20 MG capsule Take 20 mg by mouth 2 (two) times daily as needed (heartburn).       ALLERGIES:  No Known Allergies  FAM HX: Family History  Problem Relation Age of Onset  . Depression Mother   . Diabetes Sister     Social History:   reports that she has quit smoking. She has never used smokeless tobacco. She reports current alcohol use of about 10.0 standard drinks of alcohol per week. She reports that she does not use drugs.  ROS: unable to obtain with patient intubated, sedated  Blood pressure 93/65, pulse 95, temperature 98.6 F (37 C), temperature source Oral, resp. rate (!) 33, height 5\' 7"  (1.702 m), weight 73 kg, SpO2 99 %. PHYSICAL EXAM: Gen: intubated, sedated  Eyes: icteric ENT: ETT and OG in place CV: tachycardic during exam, regular Abd: soft, mod distended Lungs: coarse BL, no rales GU: foley with scant brown urine Extr:  Trace diffuse edema Neuro: sedated Skin: icteric   Results for orders placed or performed during the hospital encounter of 08/31/2019 (from the past 48 hour(s))  CBG monitoring, ED     Status: Abnormal   Collection Time: 08/11/2019  8:05 PM  Result Value Ref Range   Glucose-Capillary 117 (H) 70 - 99 mg/dL  CBC     Status: Abnormal   Collection Time: 08/11/2019  8:11 PM  Result Value Ref Range   WBC 46.8 (H)  4.0 - 10.5 K/uL   RBC 2.60 (L) 3.87 - 5.11 MIL/uL   Hemoglobin 10.0 (L) 12.0 - 15.0 g/dL   HCT 28.4 (L) 36.0 - 46.0 %   MCV 109.2 (H) 80.0 - 100.0 fL   MCH 38.5 (H) 26.0 - 34.0 pg   MCHC 35.2 30.0 - 36.0 g/dL   RDW 22.1 (H) 11.5 - 15.5 %   Platelets 134 (L) 150 - 400 K/uL   nRBC 1.8 (H) 0.0 - 0.2 %    Comment: Performed at [facility] [address], [place], [place] [address]  Comprehensive metabolic panel     Status: Abnormal   Collection Time: 08/14/2019  8:11 PM  Result Value Ref Range   Sodium 135 135 - 145 mmol/L   Potassium 4.3 3.5 - 5.1 mmol/L   Chloride 96 (L) 98 - 111 mmol/L   CO2 7 (L) 22 - 32 mmol/L    Glucose, Bld 103 (H) 70 - 99 mg/dL   BUN 88 (H) 6 - 20 mg/dL   Creatinine, Ser 6.09 (H) 0.44 - 1.00 mg/dL   Calcium 6.8 (L) 8.9 - 10.3 mg/dL   Total Protein 5.2 (L) 6.5 - 8.1 g/dL   Albumin 1.7 (L) 3.5 - 5.0 g/dL   AST 318 (H) 15 - 41 U/L    Comment: RESULTS CONFIRMED BY MANUAL DILUTION ICTERIC SPECIMEN    ALT 90 (H) 0 - 44 U/L    Comment: RESULTS CONFIRMED BY MANUAL DILUTION ICTERIC SPECIMEN    Alkaline Phosphatase 328 (H) 38 - 126 U/L   Total Bilirubin 27.1 (HH) 0.3 - 1.2 mg/dL    Comment: ICTERIC SPECIMEN CRITICAL RESULT CALLED TO, READ BACK BY AND VERIFIED WITH: P JOHNSTON,RN 2114 08/17/2019 WBOND    GFR calc non Af Amer 7 (L) >60 mL/min   GFR calc Af Amer 8 (L) >60 mL/min   Anion gap 32 (H) 5 - 15    Comment: Performed at [facility], [place] [address], [place], [place] [address]  Protime-INR     Status: Abnormal   Collection Time: 08/03/2019  8:11 PM  Result Value Ref Range   Prothrombin Time 30.5 (H) 11.4 - 15.2 seconds   INR 3.0 (H) 0.8 - 1.2    Comment: (NOTE) INR goal varies based on device and disease states. Performed at [facility], [place] [address], [place], [place] [address]   Lipase, blood     Status: Abnormal   Collection Time: 08/23/2019  8:11 PM  Result Value Ref Range   Lipase 179 (H) 11 - 51 U/L    Comment: Performed at [facility] [address] [place], [place] [address]  Ammonia     Status: Abnormal   Collection Time: 08/24/2019  8:11 PM  Result Value Ref Range   Ammonia 241 (H) 9 - 35 umol/L    Comment: Performed at [facility], [place] [address], [place], [place] [address]  Lactic acid, plasma     Status: Abnormal   Collection Time: 08/09/2019  8:11 PM  Result Value Ref Range   Lactic Acid, Venous >11.0 (HH) 0.5 - 1.9 mmol/L    Comment: ICTERUS AT THIS LEVEL MAY AFFECT RESULT CRITICAL RESULT CALLED TO, READ BACK BY AND VERIFIED WITH: Angelena Sole 2116 08/14/2019 WBOND Performed at [facility], [place] [address]  [place], [place] [address]   Ethanol     Status: None   Collection Time: 08/28/2019  8:12 PM  Result Value Ref Range   Alcohol, Ethyl (B) <10 <10 mg/dL    Comment: (NOTE) Lowest detectable limit for serum alcohol is 10 mg/dL. For medical purposes only. Performed at [facility], [place] [address], [place], [place] [address]   Type and screen     Status: None   Collection Time: 08/22/2019  8:20 PM  Result Value Ref Range   ABO/RH(D) B POS    Antibody Screen NEG    Sample Expiration      08/07/2019,2359 Performed at [facility], [place] [address], [place], [place] [address]   ABO/Rh     Status: None   Collection Time: 08/05/2019  8:20 PM  Result Value Ref Range   ABO/RH(D)      B POS Performed at [facility] [address], [place], [place] [address]   Blood Culture (routine x 2)     Status: None (Preliminary result)   Collection Time: 08/25/2019  8:47 PM   Specimen: BLOOD LEFT HAND  Result Value Ref Range   Specimen Description BLOOD LEFT HAND    Special Requests      BOTTLES DRAWN AEROBIC AND ANAEROBIC Blood Culture adequate volume   Culture  Setup Time      GRAM NEGATIVE RODS ANAEROBIC BOTTLE ONLY Organism ID to follow    Culture      NO GROWTH < 24 HOURS Performed at [facility], [place] [address] [place], [place] [address]    Report Status PENDING   Urinalysis, Routine w reflex microscopic     Status: Abnormal   Collection Time: 08/30/2019  9:35 PM  Result Value Ref Range   Color, Urine GREEN (A) YELLOW    Comment: BIOCHEMICALS MAY BE AFFECTED BY COLOR   APPearance CLOUDY (A) CLEAR   Specific Gravity, Urine 1.020 1.005 - 1.030   pH 7.5 5.0 - 8.0   Glucose, UA >=500 (A) NEGATIVE mg/dL   Hgb urine dipstick LARGE (A) NEGATIVE   Bilirubin Urine LARGE (A) NEGATIVE   Ketones, ur 15 (A) NEGATIVE mg/dL   Protein, ur >300 (A) NEGATIVE mg/dL   Nitrite NEGATIVE NEGATIVE   Leukocytes,Ua LARGE (A) NEGATIVE    Comment: Performed at [facility], [place]  [address], [place],  [address]  Rapid urine drug screen (hospital performed)     Status: Abnormal   Collection Time: 08/07/2019  9:35 PM  Result Value Ref Range   Opiates NONE DETECTED NONE DETECTED   Cocaine NONE DETECTED NONE DETECTED   Benzodiazepines POSITIVE (A) NONE DETECTED   Amphetamines NONE DETECTED NONE DETECTED   Tetrahydrocannabinol NONE DETECTED NONE DETECTED   Barbiturates NONE DETECTED NONE DETECTED    Comment: (NOTE) DRUG SCREEN FOR MEDICAL PURPOSES ONLY.  IF CONFIRMATION IS NEEDED FOR ANY PURPOSE, NOTIFY LAB WITHIN 5 DAYS. LOWEST DETECTABLE LIMITS FOR URINE DRUG SCREEN Drug Class                     Cutoff (ng/mL) Amphetamine and metabolites    1000 Barbiturate and metabolites    200 Benzodiazepine                 [id number] Tricyclics and metabolites     300 Opiates and metabolites        300 Cocaine and metabolites        300 THC  50 Performed at [facility], [place] [address], [place], [place] [address]   Urinalysis, Microscopic (reflex)     Status: Abnormal   Collection Time: 08/12/2019  9:35 PM  Result Value Ref Range   RBC / HPF 0-5 0 - 5 RBC/hpf   WBC, UA >50 0 - 5 WBC/hpf   Bacteria, UA MANY (A) NONE SEEN   Squamous Epithelial / LPF 0-5 0 - 5   Urine-Other LESS THAN 10 mL OF URINE SUBMITTED     Comment: MICROSCOPIC EXAM PERFORMED ON UNCONCENTRATED URINE Performed at [facility], [place] [address] [place], [place] [address]   Lactic acid, plasma     Status: Abnormal   Collection Time: 08/12/2019 10:52 PM  Result Value Ref Range   Lactic Acid, Venous >11.0 (HH) 0.5 - 1.9 mmol/L    Comment: CRITICAL VALUE NOTED.  VALUE IS CONSISTENT WITH PREVIOUSLY REPORTED AND CALLED VALUE. Performed at [facility], [place] [address], [place], [place] [address]   Hemoglobin A1c     Status: Abnormal   Collection Time: 08/26/2019 11:05 PM  Result Value Ref Range   Hgb A1c MFr Bld 4.7 (L) 4.8 - 5.6 %    Comment: (NOTE) Pre diabetes:           5.7%-6.4% Diabetes:              >6.4% Glycemic control for   <7.0% adults with diabetes    Mean Plasma Glucose 88.19 mg/dL    Comment: Performed at [facility] [address] [place], [place] [address]  CBC     Status: Abnormal   Collection Time: 08/25/2019 11:05 PM  Result Value Ref Range   WBC 46.2 (H) 4.0 - 10.5 K/uL   RBC 2.42 (L) 3.87 - 5.11 MIL/uL   Hemoglobin 9.1 (L) 12.0 - 15.0 g/dL   HCT 27.1 (L) 36.0 - 46.0 %   MCV 112.0 (H) 80.0 - 100.0 fL   MCH 37.6 (H) 26.0 - 34.0 pg   MCHC 33.6 30.0 - 36.0 g/dL   RDW 21.9 (H) 11.5 - 15.5 %   Platelets 107 (L) 150 - 400 K/uL    Comment: REPEATED TO VERIFY PLATELET COUNT CONFIRMED BY SMEAR SPECIMEN CHECKED FOR CLOTS Immature Platelet Fraction may be clinically indicated, consider ordering this additional test [id number]    nRBC 2.4 (H) 0.0 - 0.2 %    Comment: Performed at [facility], [place] [address], [place], [place] [address]  Creatinine, serum     Status: Abnormal   Collection Time: 08/07/2019 11:05 PM  Result Value Ref Range   Creatinine, Ser 5.86 (H) 0.44 - 1.00 mg/dL   GFR calc non Af Amer 7 (L) >60 mL/min   GFR calc Af Amer 9 (L) >60 mL/min    Comment: Performed at [facility] [address] [place], [place] [address]  SARS CORONAVIRUS 2 (TAT 6-24 HRS) Nasopharyngeal Nasopharyngeal Swab     Status: None   Collection Time: 08/16/2019 11:05 PM   Specimen: Nasopharyngeal Swab  Result Value Ref Range   SARS Coronavirus 2 NEGATIVE NEGATIVE    Comment: (NOTE) SARS-CoV-2 target nucleic acids are NOT DETECTED. The SARS-CoV-2 RNA is generally detectable in upper and lower respiratory specimens during the acute phase of infection. Negative results do not preclude SARS-CoV-2 infection, do not rule out co-infections with other pathogens, and should not be used as the sole basis for treatment or other patient management decisions.  Negative results must be combined with clinical observations, patient history,  and epidemiological information. The expected result is Negative. Fact Sheet for Patients: SugarRoll.be Fact Sheet for Healthcare Providers: https://www.woods-mathews.com/ This test is not yet approved or cleared by the [place] FDA and  has been authorized for detection and/or diagnosis of SARS-CoV-2 by FDA under an Emergency Use Authorization (EUA). This EUA will remain  in effect (meaning this test can be used) for the duration of the COVID-19 declaration under Section 56 4(b)(1) of the Act, 21 U.S.C. section 360bbb-3(b)(1), unless the authorization is terminated or revoked sooner. Performed at [facility], [place] [address], [place], [place] [address]   D-dimer, quantitative (not at [facility])     Status: Abnormal   Collection Time: 08/22/2019 11:05 PM  Result Value Ref Range   D-Dimer, Quant 3.81 (H) 0.00 - 0.50 ug/mL-FEU    Comment: (NOTE) At the manufacturer cut-off of 0.50 ug/mL FEU, this assay has been documented to exclude PE with a sensitivity and negative predictive value of 97 to 99%.  At this time, this assay has not been approved by the FDA to exclude DVT/VTE. Results should be correlated with clinical presentation. Performed at [facility], [place] [address], [place], [place] [address]   POC CBG, ED     Status: Abnormal   Collection Time: 08/19/2019 11:23 PM  Result Value Ref Range   Glucose-Capillary 114 (H) 70 - 99 mg/dL  Prepare fresh frozen plasma     Status: None (Preliminary result)   Collection Time: 08/10/2019 11:27 PM  Result Value Ref Range   Unit Number [id number]    Blood Component Type THAWED PLASMA    Unit division 00    Status of Unit ISSUED    Transfusion Status OK TO TRANSFUSE    Unit Number [id number]    Blood Component Type THAWED PLASMA    Unit division 00    Status of Unit ISSUED    Transfusion Status      OK TO TRANSFUSE Performed at [facility] [address],  [place], [place] [address]   Cortisol     Status: None   Collection Time: 08/14/2019 11:48 PM  Result Value Ref Range   Cortisol, Plasma 38.6 ug/dL    Comment: (NOTE) AM    6.7 - 22.6 ug/dL PM   <10.0       ug/dL Performed at [facility] [address] [place], [place] [address]   Glucose, capillary     Status: Abnormal   Collection Time: 08/05/19 12:23 AM  Result Value Ref Range   Glucose-Capillary 108 (H) 70 - 99 mg/dL  MRSA PCR Screening     Status: None   Collection Time: 08/05/19 12:33 AM   Specimen: Nasopharyngeal  Result Value Ref Range   MRSA by PCR NEGATIVE NEGATIVE    Comment:        The GeneXpert MRSA Assay (FDA approved for NASAL specimens only), is one component of a comprehensive MRSA colonization surveillance program. It is not intended to diagnose MRSA infection nor to guide or monitor treatment for MRSA infections. Performed at [facility], [place] [address], [place], [place] [address]   Glucose, capillary     Status: Abnormal   Collection Time: 08/05/19  2:07 AM  Result Value Ref Range   Glucose-Capillary 119 (H) 70 - 99 mg/dL  CBC     Status: Abnormal   Collection Time: 08/05/19  2:55 AM  Result Value Ref Range   WBC 47.8 (H) 4.0 - 10.5 K/uL   RBC 2.19 (L) 3.87 - 5.11 MIL/uL   Hemoglobin 8.3 (L) 12.0 - 15.0 g/dL   HCT 24.2 (L) 36.0 - 46.0 %   MCV 110.5 (H) 80.0 - 100.0 fL   MCH 37.9 (H) 26.0 - 34.0 pg   MCHC 34.3 30.0 - 36.0 g/dL   RDW 21.8 (H) 11.5 - 15.5 %   Platelets 104 (L) 150 - 400 K/uL    Comment: REPEATED TO VERIFY Immature Platelet Fraction may be clinically indicated, consider ordering this additional test [id number] CONSISTENT WITH PREVIOUS RESULT    nRBC 2.7 (H) 0.0 - 0.2 %    Comment: Performed at [facility], [address], [place], [place] [address]  Magnesium     Status: None   Collection Time: 08/05/19  2:55 AM  Result Value Ref Range   Magnesium 1.8 1.7 - 2.4 mg/dL    Comment: Performed at [facility] [address] [place], [place] [address]  Phosphorus     Status: Abnormal   Collection Time: 08/05/19  2:55 AM  Result Value Ref Range   Phosphorus 7.4 (H) 2.5 - 4.6 mg/dL    Comment: Performed at [facility] [address], [place], [place] [address]  Comprehensive metabolic panel     Status: Abnormal   Collection Time: 08/05/19  2:55 AM  Result Value Ref Range   Sodium 140 135 - 145 mmol/L   Potassium 2.4 (LL) 3.5 - 5.1 mmol/L    Comment: CRITICAL RESULT CALLED TO, READ BACK BY AND VERIFIED WITH: CUMMINGS,B RN 08/05/2019 0401 JORDANS    Chloride 102 98 - 111 mmol/L   CO2 <7 (L) 22 - 32 mmol/L   Glucose, Bld 116 (H) 70 - 99 mg/dL   BUN 79 (H) 6 - 20 mg/dL   Creatinine, Ser 5.58 (H) 0.44 - 1.00 mg/dL   Calcium 6.0 (LL) 8.9 - 10.3 mg/dL    Comment: CRITICAL RESULT CALLED TO, READ BACK BY AND VERIFIED WITH: CUMMINGSMB RN 08/05/2019 0401 JORDANS    Total Protein 4.9 (L) 6.5 - 8.1 g/dL   Albumin 1.6 (L) 3.5 - 5.0 g/dL   AST 642 (H) 15 - 41 U/L   ALT 124 (H) 0 - 44 U/L    Comment: RESULTS CONFIRMED BY MANUAL DILUTION   Alkaline Phosphatase 277 (H) 38 - 126 U/L   Total Bilirubin 25.1 (HH) 0.3 - 1.2 mg/dL    Comment: CRITICAL RESULT CALLED TO, READ BACK BY AND VERIFIED WITH: CUMMINGS,B RN 08/05/2019 0401 JORDANS    GFR calc non Af Amer 8 (L) >60 mL/min   GFR calc Af Amer 9 (L) >60 mL/min   Anion gap NOT CALCULATED 5 - 15    Comment: Performed at [facility], [place] [address] [place], [place] [address]  Brain natriuretic peptide     Status: Abnormal   Collection Time: 08/05/19  2:55 AM  Result Value Ref Range   B Natriuretic Peptide 824.4 (H) 0.0 - 100.0 pg/mL    Comment: Performed at [facility] [address] [place], [place] [address]  Glucose, capillary     Status: Abnormal   Collection Time: 08/05/19  3:03 AM  Result Value Ref Range   Glucose-Capillary 114 (H) 70 - 99 mg/dL  Glucose, capillary     Status: Abnormal   Collection Time:  08/05/19  4:17 AM  Result Value  Ref Range   Glucose-Capillary 102 (H) 70 - 99 mg/dL  Lactic acid, plasma     Status: Abnormal   Collection Time: 08/05/19  4:41 AM  Result Value Ref Range   Lactic Acid, Venous >11.0 (HH) 0.5 - 1.9 mmol/L    Comment: CRITICAL VALUE NOTED.  VALUE IS CONSISTENT WITH PREVIOUSLY REPORTED AND CALLED VALUE. Performed at [facility], [place] [address] [place], [place] [address]   Glucose, capillary     Status: Abnormal   Collection Time: 08/05/19  5:14 AM  Result Value Ref Range   Glucose-Capillary 116 (H) 70 - 99 mg/dL  Prepare fresh frozen plasma     Status: None (Preliminary result)   Collection Time: 08/05/19  7:06 AM  Result Value Ref Range   Unit Number [id number]    Blood Component Type THAWED PLASMA    Unit division 00    Status of Unit ALLOCATED    Transfusion Status OK TO TRANSFUSE    Unit Number [id number]    Blood Component Type THAWED PLASMA    Unit division 00    Status of Unit ALLOCATED    Transfusion Status OK TO TRANSFUSE   Blood gas, arterial     Status: Abnormal   Collection Time: 08/05/19  8:50 AM  Result Value Ref Range   FIO2 100.00    Delivery systems VENTILATOR    Mode PRESSURE REGULATED VOLUME CONTROL    VT 410 mL   LHR 25 resp/min   Peep/cpap 5.0 cm H20   pH, Arterial 7.075 (LL) 7.350 - 7.450    Comment: CRITICAL RESULT CALLED TO, READ BACK BY AND VERIFIED WITH: ROBIN ROBERTS RN AT [phone] BY [facility] BROWNING RRT RCP ON 08/05/2019    pCO2 arterial 33.0 32.0 - 48.0 mmHg   pO2, Arterial 332 (H) 83.0 - 108.0 mmHg   Bicarbonate 9.3 (L) 20.0 - 28.0 mmol/L   Acid-base deficit 18.8 (H) 0.0 - 2.0 mmol/L   O2 Saturation 98.7 %   Patient temperature 98.2    Collection site A-LINE    Drawn by [phone]    Sample type ARTERIAL DRAW   Glucose, capillary     Status: None   Collection Time: 08/05/19  9:07 AM  Result Value Ref Range   Glucose-Capillary 83 70 - 99 mg/dL  Rapid HIV screen     Status: None   Collection Time:  08/05/19 10:10 AM  Result Value Ref Range   HIV-1 P24 Antigen - HIV24 NON REACTIVE NON REACTIVE    Comment: (NOTE) Detection of p24 may be inhibited by biotin in the sample, causing false negative results in acute infection.    HIV 1/2 Antibodies NON REACTIVE NON REACTIVE   Interpretation (HIV Ag Ab)      A non reactive test result means that HIV 1 or HIV 2 antibodies and HIV 1 p24 antigen were not detected in the specimen.    Comment: Performed at [facility], [place] [address] [place], [place] [address]  Comprehensive metabolic panel     Status: Abnormal   Collection Time: 08/05/19 10:10 AM  Result Value Ref Range   Sodium 141 135 - 145 mmol/L   Potassium 2.2 (LL) 3.5 - 5.1 mmol/L    Comment: CRITICAL RESULT CALLED TO, READ BACK BY AND VERIFIED WITH: R.ROBERTS,RN 08/05/2019 1046 DAVISB    Chloride 102 98 - 111 mmol/L   CO2 12 (L) 22 - 32 mmol/L   Glucose, Bld 96 70 - 99 mg/dL  BUN 73 (H) 6 - 20 mg/dL   Creatinine, Ser 5.28 (H) 0.44 - 1.00 mg/dL   Calcium 6.2 (LL) 8.9 - 10.3 mg/dL    Comment: CRITICAL RESULT CALLED TO, READ BACK BY AND VERIFIED WITH: R.ROBERTS,RN 08/05/2019 1046 DAVISB    Total Protein 5.1 (L) 6.5 - 8.1 g/dL   Albumin 2.0 (L) 3.5 - 5.0 g/dL   AST 1,130 (H) 15 - 41 U/L   ALT 161 (H) 0 - 44 U/L   Alkaline Phosphatase 288 (H) 38 - 126 U/L   Total Bilirubin 28.1 (HH) 0.3 - 1.2 mg/dL    Comment: CRITICAL RESULT CALLED TO, READ BACK BY AND VERIFIED WITH: R.ROBERTS,RN 08/05/2019 1046 DAVISB    GFR calc non Af Amer 8 (L) >60 mL/min   GFR calc Af Amer 10 (L) >60 mL/min   Anion gap 27 (H) 5 - 15    Comment: Performed at [facility] [address], [place], [place] [address]  RPR     Status: None   Collection Time: 08/05/19 10:10 AM  Result Value Ref Range   RPR Ser Ql NON REACTIVE NON REACTIVE    Comment: Performed at [facility] [address], [place], [place] [address]  Protime-INR     Status: Abnormal   Collection Time: 08/05/19 10:10 AM   Result Value Ref Range   Prothrombin Time 30.5 (H) 11.4 - 15.2 seconds   INR 3.0 (H) 0.8 - 1.2    Comment: (NOTE) INR goal varies based on device and disease states. Performed at [facility], [place] [address] [place], [place] [address]   CBC with Differential/Platelet     Status: Abnormal   Collection Time: 08/05/19 10:10 AM  Result Value Ref Range   WBC 53.8 (HH) 4.0 - 10.5 K/uL    Comment: REPEATED TO VERIFY THIS CRITICAL RESULT HAS VERIFIED AND BEEN CALLED TO ROBERTS,R RN BY AMANDA LEONARD ON 09 03 2020 AT 1052, AND HAS BEEN READ BACK.     RBC 2.11 (L) 3.87 - 5.11 MIL/uL   Hemoglobin 8.1 (L) 12.0 - 15.0 g/dL   HCT 23.0 (L) 36.0 - 46.0 %   MCV 109.0 (H) 80.0 - 100.0 fL   MCH 38.4 (H) 26.0 - 34.0 pg   MCHC 35.2 30.0 - 36.0 g/dL   RDW 21.6 (H) 11.5 - 15.5 %   Platelets 145 (L) 150 - 400 K/uL    Comment: REPEATED TO VERIFY   nRBC 4.7 (H) 0.0 - 0.2 %   Neutrophils Relative % 94 %   Neutro Abs 50.6 (H) 1.7 - 7.7 K/uL   Lymphocytes Relative 5 %   Lymphs Abs 2.7 0.7 - 4.0 K/uL   Monocytes Relative 1 %   Monocytes Absolute 0.5 0.1 - 1.0 K/uL   Eosinophils Relative 0 %   Eosinophils Absolute 0.0 0.0 - 0.5 K/uL   Basophils Relative 0 %   Basophils Absolute 0.0 0.0 - 0.1 K/uL   WBC Morphology INCREASED BANDS (>20% BANDS)     Comment: MODERATE LEFT SHIFT (>5% METAS AND MYELOS,OCC PRO NOTED)   Abs Immature Granulocytes 0.00 0.00 - 0.07 K/uL   Target Cells PRESENT     Comment: Performed at [facility], [address], [place], [place] [address]  CK     Status: None   Collection Time: 08/05/19 10:10 AM  Result Value Ref Range   Total CK 115 38 - 234 U/L    Comment: Performed at  [facility], [place] [address] [place], [place] [address]  I-STAT 7, (LYTES, BLD GAS, ICA, H+H)     Status: Abnormal   Collection Time: 08/05/19 10:24 AM  Result Value Ref Range   pH, Arterial 7.182 (LL) 7.350 - 7.450   pCO2 arterial 38.1 32.0 - 48.0 mmHg   pO2, Arterial 405.0 (H) 83.0  - 108.0 mmHg   Bicarbonate 14.3 (L) 20.0 - 28.0 mmol/L   TCO2 16 (L) 22 - 32 mmol/L   O2 Saturation 100.0 %   Acid-base deficit 13.0 (H) 0.0 - 2.0 mmol/L   Sodium 144 135 - 145 mmol/L   Potassium 2.2 (LL) 3.5 - 5.1 mmol/L   Calcium, Ion 0.80 (LL) 1.15 - 1.40 mmol/L   HCT 30.0 (L) 36.0 - 46.0 %   Hemoglobin 10.2 (L) 12.0 - 15.0 g/dL   Patient temperature 98.2 F    Sample type ARTERIAL    Comment NOTIFIED PHYSICIAN     Dg Chest 1 View  Result Date: 08/05/2019 CLINICAL DATA:  Post catheter placement, right-sided EXAM: CHEST  1 VIEW COMPARISON:  08/05/2019 FINDINGS: Interval placement of a right neck vascular catheter, tip positioned near the superior cavoatrial junction. Other support apparatus including endotracheal tube, esophagogastric tube, and left neck vascular catheter are unchanged. Cardiomegaly with heterogeneous opacity of the retrocardiac left lung. No new airspace opacity. IMPRESSION: Interval placement of a right neck vascular catheter, tip positioned near the superior cavoatrial junction. Electronically Signed   By: Eddie Candle M.D.   On: 08/05/2019 13:02   Dg Chest 1 View  Result Date: 08/05/2019 CLINICAL DATA:  ETT placement EXAM: CHEST  1 VIEW COMPARISON:  08/05/2019, 7:09 a.m. FINDINGS: Interval placement of endotracheal tube, tip positioned over the mid trachea. Interval placement of esophagogastric tube, tip and side port below the diaphragm. Left neck vascular catheter remains in position. New heterogeneous retrocardiac opacity, which may reflect atelectasis or airspace disease. IMPRESSION: 1. Interval placement of endotracheal tube, tip positioned over the mid trachea. Interval placement of esophagogastric tube, tip and side port below the diaphragm. 2. New heterogeneous retrocardiac opacity, which may reflect atelectasis or airspace disease. Electronically Signed   By: Eddie Candle M.D.   On: 08/05/2019 08:49   Dg Abd 1 View  Result Date: 08/05/2019 CLINICAL DATA:   Nasogastric tube placement. EXAM: ABDOMEN - 1 VIEW COMPARISON:  None. FINDINGS: Distal tip of nasogastric tube is seen in expected position of distal stomach. Air-filled large and small bowel is noted within the visualized portion of the upper abdomen. Small bowel may be mildly dilated suggesting possible ileus or obstruction. IMPRESSION: Nasogastric tube tip seen in expected position of distal stomach. Air-filled large and small bowel is noted, with mild dilatation of the small bowel suggesting possible ileus or obstruction. Electronically Signed   By: Marijo Conception M.D.   On: 08/05/2019 08:50   Dg Chest Port 1 View  Result Date: 08/05/2019 CLINICAL DATA:  Central line placement. EXAM: PORTABLE CHEST 1 VIEW COMPARISON:  Chest x-ray from yesterday. FINDINGS: New left internal jugular central venous catheter with tip in the mid SVC. The heart size and mediastinal contours are within normal limits. Normal pulmonary vascularity. No focal consolidation, pleural effusion, or pneumothorax. No acute osseous abnormality. IMPRESSION: 1. New left internal jugular central venous catheter without complicating feature. No active disease. Electronically Signed   By: Titus Dubin M.D.   On: 08/05/2019 07:34   Dg Chest Port 1 View  Result Date: 08/18/2019 CLINICAL DATA:  Drug overdose. EXAM: PORTABLE CHEST 1 VIEW COMPARISON:  Single-view of the chest 03/11/2018. FINDINGS: Lung volumes are low. Lungs are clear. No pneumothorax or pleural effusion heart size is normal. No acute or focal bony abnormality. IMPRESSION: Negative chest. Electronically Signed   By: Inge Rise M.D.   On: 08/22/2019 20:39   US Abdomen Limited Ruq  Result Date: 08/21/2019 CLINICAL DATA:  Jaundice EXAM: ULTRASOUND ABDOMEN LIMITED RIGHT UPPER QUADRANT COMPARISON:  CT dated March 28, 2018. FINDINGS: Gallbladder: There is gallbladder wall thickening with the gallbladder wall measuring approximately 1 cm in thickness. The sonographic Percell Miller  sign is negative. There are few small gallstones with gallbladder sludge. Common bile duct: Diameter: 5 mm Liver: Insert fat portal vein is patent on color Doppler imaging with normal direction of blood flow towards the liver. Other: None. IMPRESSION: 1. Cholelithiasis with gallbladder wall thickening. There are no additional findings to suggest a diagnosis of acute cholecystitis. If there is clinical concern for acute cholecystitis, follow-up with HIDA scan is recommended. 2. Hepatic steatosis. Electronically Signed   By: Constance Holster M.D.   On: 08/03/2019 23:12    Assessment/Plan **Acute hepatitis, alcoholic:  Severe.  IV steroids.  Poor prognosis.  **Shock: req vasopressor support, albumin, on broad spectrum antibiotics.  **AKI, severe, oliguric:  Presumably ATN from shock though likely multifactorial with contributions of dramatically elevated bilirubin, possibly hepatorenal type physiology.  Renal US and urine sodium creatinine for completeness, but unlikely to change management.  Start CRRT now 4K/2.5Ca, no heparin, UF net even.   **AGMA: lactate + AKI.  CRRT should help.  On na bicarb gtt currently, anticipate d/c once CRRT going.   **Hypokalemia: 4K dialysate, repletion IV.    **Hypocalcemia:  Low despite IV repletion this AM, give 1g IV ca gluconate now prior to CRRT.   Justin Mend 08/05/2019, 1:34 PM

## 2019-08-05 NOTE — Progress Notes (Signed)
ABG drawn per MD order.  Results given to MD

## 2019-08-05 NOTE — Progress Notes (Signed)
NAME:  Victoria Osborne, MRN:  [id number], DOB:  May 17, 1961, LOS: 1 ADMISSION DATE:  08/28/2019, CONSULTATION DATE:  08/18/2019 REFERRING MD:  ER, CHIEF COMPLAINT:  AMS   Brief History   58 year old woman with history of alcoholism presenting in severe alcoholic hepatitis and possibly septic shock.  History of present illness   "This 59 y.o. Caucasian female smoker presented to the [facility] Emergency Department via EMS with complaints of altered mental status.  The patient's boyfriend Jimmie Molly "[facility]")  is at the bedside and reports that he found her lying on the floor at home, unresponsive with approximately 9 empty bottles of vodka.  He reports that she has been visibly jaundiced for over a week, as he saw her about a week ago, when she was jaundiced but mentating at baseline. At the time of clinical interview, the patient is awake, moaning and groaning constantly, uncooperative but protecting her airway.  She is known to be a heavy drinker and has previously experienced alcohol withdrawal during hospitalizations.  The patient's boyfriend states that, while he has assumed that she has liver cirrhosis, he is unaware that the patient has ever been formally diagnosed with any chronic liver disease.  As far as health problems are concerned, he is only aware of heavy vaginal bleeding as her only other health concern."  Past Medical History  Anxiety/depression EtOH abuse  Significant Hospital Events   08/24/2019 admitted  Consults:  None  Procedures:  9/3 Central line, A-line, intubation, foley  Significant Diagnostic Tests:  CXR 9/2 NAD  Micro Data:  Blood cultures 9/2 NGTD  Antimicrobials:  Vanc 9/2>>> Cefepime 9/2>>>   Interim history/subjective:  On arrival to the unit, this patient was obtunded and tachypneic in the 30s.  She was not protecting her airway.  Intubated and OG tube placed with removal of 600 cc of dark bilious fluid.  Objective   Blood  pressure (!) 87/48, pulse 100, temperature (!) 96.5 F (35.8 C), temperature source Rectal, resp. rate 18, height 5\' 7"  (1.702 m), weight 73 kg, SpO2 98 %.        Intake/Output Summary (Last 24 hours) at 08/05/2019 0813 Last data filed at 08/05/2019 0515 Gross per 24 hour  Intake 4083.18 ml  Output -  Net 4083.18 ml   Filed Weights   08/03/2019 2100 08/05/19 0120  Weight: 65.8 kg 73 kg    Examination: GEN: Ill-appearing jaundiced woman laying in bed HEENT: Mucous membranes dry with bleeding oral mucosa CV: Regular rate and rhythm, holosystolic murmur over apex, extremities are warm PULM: Transmitted upper airway sounds, shallow inspiratory effort, tachypneic GI: Protuberant, tympanic to percussion, ultrasound revealing trace ascites as well as hepatosplenomegaly EXT: no edema NEURO: not following commands, moans to pain PSYCH: cannot eval, RASS -4 SKIN: jaundiced   Resolved Hospital Problem list     Assessment & Plan:  # alcoholic hepatitis-Madri discriminant score 109 indicating severe life-threatening disease # acute respiratory failure secondary to inability to protect airway as well as severe acidosis # shock, distributive versus septic- cortisol okay, on pressors # acute kidney injury with metabolic acidosis mixed gap/nongap-inability to clear lactate is most likely related to hepatic failure # acute metabolic encephalopathy secondary to critical illness and hepatic failure, moving all 4 ext on exam, ammonia 241 # hepatic coagulopathy # heavy alcohol abuse # abd tenderness- suspicion this is liver tenderness and reactive ileus, check KUB but low threshold for CT scan given lack of lactic acid clearance  -  Vent support, usual Vap precautions, check ABG - Start methylprednisolone - KUB - Continue Vanco and cefepime for now, check procalcitonin, follow-up blood/urine cultures - Start bicarb drip, Foley, monitor urine output, high risk for needing dialysis support -  Thiamine/folate, monitor for signs and symptoms of withdrawal - Levophed/vasopressin titrated to MAP 65 -Very poor prognosis, will update son when he comes in  Best practice:  Diet: NPO, OGT to LIS Pain/Anxiety/Delirium protocol (if indicated): Precedex, PRN fentanyl VAP protocol (if indicated): ordred DVT prophylaxis: SCDs for now given coagulopathy and thrombocytopenia GI prophylaxis: PPI Glucose control: CBG alone for now Mobility: BR Code Status: Full Family Communication: Update son when comes in Disposition: ICU  Labs   CBC: Recent Labs  Lab 08/03/2019 2011 08/21/2019 2305 08/05/19 0255  WBC 46.8* 46.2* 47.8*  HGB 10.0* 9.1* 8.3*  HCT 28.4* 27.1* 24.2*  MCV 109.2* 112.0* 110.5*  PLT 134* 107* 104*    Basic Metabolic Panel: Recent Labs  Lab 08/07/2019 2011 08/07/2019 2305 08/05/19 0255  NA 135  --  140  K 4.3  --  2.4*  CL 96*  --  102  CO2 7*  --  <7*  GLUCOSE 103*  --  116*  BUN 88*  --  79*  CREATININE 6.09* 5.86* 5.58*  CALCIUM 6.8*  --  6.0*  MG  --   --  1.8  PHOS  --   --  7.4*   GFR: Estimated Creatinine Clearance: 10.8 mL/min (A) (by C-G formula based on SCr of 5.58 mg/dL (H)). Recent Labs  Lab 08/23/2019 2011 08/18/2019 2252 08/12/2019 2305 08/05/19 0255 08/05/19 0441  WBC 46.8*  --  46.2* 47.8*  --   LATICACIDVEN >11.0* >11.0*  --   --  >11.0*    Liver Function Tests: Recent Labs  Lab 08/07/2019 2011 08/05/19 0255  AST 318* 642*  ALT 90* 124*  ALKPHOS 328* 277*  BILITOT 27.1* 25.1*  PROT 5.2* 4.9*  ALBUMIN 1.7* 1.6*   Recent Labs  Lab 08/14/2019 2011  LIPASE 179*   Recent Labs  Lab 08/19/2019 2011  AMMONIA 241*    ABG No results found for: PHART, PCO2ART, PO2ART, HCO3, TCO2, ACIDBASEDEF, O2SAT   Coagulation Profile: Recent Labs  Lab 08/25/2019 2011  INR 3.0*    Cardiac Enzymes: No results for input(s): CKTOTAL, CKMB, CKMBINDEX, TROPONINI in the last 168 hours.  HbA1C: Hgb A1c MFr Bld  Date/Time Value Ref Range Status   08/19/2019 11:05 PM 4.7 (L) 4.8 - 5.6 % Final    Comment:    (NOTE) Pre diabetes:          5.7%-6.4% Diabetes:              >6.4% Glycemic control for   <7.0% adults with diabetes   02/27/2016 09:30 AM 5.3 <5.7 % Final    Comment:      For the purpose of screening for the presence of diabetes:   <5.7%       Consistent with the absence of diabetes 5.7-6.4 %   Consistent with increased risk for diabetes (prediabetes) >=6.5 %     Consistent with diabetes   This assay result is consistent with a decreased risk of diabetes.   Currently, no consensus exists regarding use of hemoglobin A1c for diagnosis of diabetes in children.   According to American Diabetes Association (ADA) guidelines, hemoglobin A1c <7.0% represents optimal control in non-pregnant diabetic patients. Different metrics may apply to specific patient populations. Standards of Medical Care in Diabetes (  ADA).       CBG: Recent Labs  Lab 08/29/2019 2005 08/24/2019 2323  GLUCAP 117* 114*     The patient is critically ill with multiple organ systems failure and requires high complexity decision making for assessment and support, frequent evaluation and titration of therapies, application of advanced monitoring technologies and extensive interpretation of multiple databases. Critical Care Time devoted to patient care services described in this note independent of APP/resident time (if applicable)  is 41 minutes.   Erskine Emery MD [facility] 08/05/2019 8:29 AM Personal pager: [phone] If unanswered, please page CCM On-call: [phone]

## 2019-08-05 NOTE — Procedures (Signed)
Hemodialysis Catheter Insertion Procedure Note Victoria Osborne [id number] 11/28/1961  Operators: Dr. Myrtie Hawk, Dr, Tamala Julian Procedure: Insertion of Hemodialysis Catheter Indications: Dialysis Access   Procedure Details Consent: Risks of procedure as well as the alternatives and risks of each were explained to the (patient/caregiver).  Consent for procedure obtained. Time Out: Verified patient identification, verified procedure, site/side was marked, verified correct patient position, special equipment/implants available, medications/allergies/relevent history reviewed, required imaging and test results available.  Performed  Maximum sterile technique was used including antiseptics, cap, gloves, gown, hand hygiene, mask and sheet. Skin prep: Chlorhexidine; local anesthetic administered Triple lumen hemodialysis catheter was inserted into right internal jugular vein using the Seldinger technique.  Evaluation Blood flow good Complications: No apparent complications Patient did tolerate procedure well. Chest X-ray ordered to verify placement.  CXR: pending.   08/05/2019

## 2019-08-06 ENCOUNTER — Inpatient Hospital Stay (HOSPITAL_COMMUNITY): Payer: BLUE CROSS/BLUE SHIELD

## 2019-08-06 LAB — RENAL FUNCTION PANEL
Albumin: 2.8 g/dL — ABNORMAL LOW (ref 3.5–5.0)
Albumin: 2.7 g/dL — ABNORMAL LOW (ref 3.5–5.0)
Anion gap: 20 — ABNORMAL HIGH (ref 5–15)
Anion gap: 17 — ABNORMAL HIGH (ref 5–15)
BUN: 40 mg/dL — ABNORMAL HIGH (ref 6–20)
BUN: 22 mg/dL — ABNORMAL HIGH (ref 6–20)
CO2: 17 mmol/L — ABNORMAL LOW (ref 22–32)
CO2: 22 mmol/L (ref 22–32)
Calcium: 6.9 mg/dL — ABNORMAL LOW (ref 8.9–10.3)
Calcium: 7.2 mg/dL — ABNORMAL LOW (ref 8.9–10.3)
Chloride: 99 mmol/L (ref 98–111)
Chloride: 100 mmol/L (ref 98–111)
Creatinine, Ser: 2.89 mg/dL — ABNORMAL HIGH (ref 0.44–1.00)
Creatinine, Ser: 2.01 mg/dL — ABNORMAL HIGH (ref 0.44–1.00)
GFR calc Af Amer: 20 mL/min — ABNORMAL LOW (ref >60)
GFR calc Af Amer: 31 mL/min — ABNORMAL LOW (ref >60)
GFR calc non Af Amer: 17 mL/min — ABNORMAL LOW (ref >60)
GFR calc non Af Amer: 27 mL/min — ABNORMAL LOW (ref >60)
Glucose, Bld: 225 mg/dL — ABNORMAL HIGH (ref 70–99)
Glucose, Bld: 146 mg/dL — ABNORMAL HIGH (ref 70–99)
Phosphorus: 4.3 mg/dL (ref 2.5–4.6)
Phosphorus: 2.2 mg/dL — ABNORMAL LOW (ref 2.5–4.6)
Potassium: 4 mmol/L (ref 3.5–5.1)
Potassium: 3.9 mmol/L (ref 3.5–5.1)
Sodium: 136 mmol/L (ref 135–145)
Sodium: 139 mmol/L (ref 135–145)

## 2019-08-06 LAB — CBC
HCT: 20.9 % — ABNORMAL LOW (ref 36.0–46.0)
Hemoglobin: 7.6 g/dL — ABNORMAL LOW (ref 12.0–15.0)
MCH: 38.4 pg — ABNORMAL HIGH (ref 26.0–34.0)
MCHC: 36.4 g/dL — ABNORMAL HIGH (ref 30.0–36.0)
MCV: 105.6 fL — ABNORMAL HIGH (ref 80.0–100.0)
Platelets: 91 10*3/uL — ABNORMAL LOW (ref 150–400)
RBC: 1.98 MIL/uL — ABNORMAL LOW (ref 3.87–5.11)
RDW: 21.8 % — ABNORMAL HIGH (ref 11.5–15.5)
WBC: 45.9 10*3/uL — ABNORMAL HIGH (ref 4.0–10.5)
nRBC: 5.6 % — ABNORMAL HIGH (ref 0.0–0.2)

## 2019-08-06 LAB — SALICYLATE LEVEL: Salicylate Lvl: 17.8 mg/dL (ref 2.8–30.0)

## 2019-08-06 LAB — MAGNESIUM
Magnesium: 1.9 mg/dL (ref 1.7–2.4)
Magnesium: 2 mg/dL (ref 1.7–2.4)
Magnesium: 2 mg/dL (ref 1.7–2.4)

## 2019-08-06 LAB — PREPARE FRESH FROZEN PLASMA
Unit division: 0
Unit division: 0

## 2019-08-06 LAB — BPAM FFP
Blood Product Expiration Date: 202009072359
Blood Product Expiration Date: 202009072359
ISSUE DATE / TIME: 202009030052
ISSUE DATE / TIME: 202009030318
Unit Type and Rh: 1700
Unit Type and Rh: 7300

## 2019-08-06 LAB — POCT I-STAT 7, (LYTES, BLD GAS, ICA,H+H)
Acid-base deficit: 4 mmol/L — ABNORMAL HIGH (ref 0.0–2.0)
Acid-base deficit: 5 mmol/L — ABNORMAL HIGH (ref 0.0–2.0)
Bicarbonate: 19.2 mmol/L — ABNORMAL LOW (ref 20.0–28.0)
Bicarbonate: 18.9 mmol/L — ABNORMAL LOW (ref 20.0–28.0)
Calcium, Ion: 0.86 mmol/L — CL (ref 1.15–1.40)
Calcium, Ion: 0.9 mmol/L — ABNORMAL LOW (ref 1.15–1.40)
HCT: 25 % — ABNORMAL LOW (ref 36.0–46.0)
HCT: 23 % — ABNORMAL LOW (ref 36.0–46.0)
Hemoglobin: 8.5 g/dL — ABNORMAL LOW (ref 12.0–15.0)
Hemoglobin: 7.8 g/dL — ABNORMAL LOW (ref 12.0–15.0)
O2 Saturation: 100 %
O2 Saturation: 99 %
Patient temperature: 98.2
Potassium: 3.8 mmol/L (ref 3.5–5.1)
Potassium: 3.6 mmol/L (ref 3.5–5.1)
Sodium: 138 mmol/L (ref 135–145)
Sodium: 140 mmol/L (ref 135–145)
TCO2: 20 mmol/L — ABNORMAL LOW (ref 22–32)
TCO2: 20 mmol/L — ABNORMAL LOW (ref 22–32)
pCO2 arterial: 27.5 mmHg — ABNORMAL LOW (ref 32.0–48.0)
pCO2 arterial: 30.1 mmHg — ABNORMAL LOW (ref 32.0–48.0)
pH, Arterial: 7.452 — ABNORMAL HIGH (ref 7.350–7.450)
pH, Arterial: 7.403 (ref 7.350–7.450)
pO2, Arterial: 229 mmHg — ABNORMAL HIGH (ref 83.0–108.0)
pO2, Arterial: 124 mmHg — ABNORMAL HIGH (ref 83.0–108.0)

## 2019-08-06 LAB — HEPATIC FUNCTION PANEL
ALT: 227 U/L — ABNORMAL HIGH (ref 0–44)
AST: 1452 U/L — ABNORMAL HIGH (ref 15–41)
Albumin: 2.9 g/dL — ABNORMAL LOW (ref 3.5–5.0)
Alkaline Phosphatase: 274 U/L — ABNORMAL HIGH (ref 38–126)
Bilirubin, Direct: 23.3 mg/dL — ABNORMAL HIGH (ref 0.0–0.2)
Indirect Bilirubin: 9.8 mg/dL — ABNORMAL HIGH (ref 0.3–0.9)
Total Bilirubin: 33.1 mg/dL (ref 0.3–1.2)
Total Protein: 5.9 g/dL — ABNORMAL LOW (ref 6.5–8.1)

## 2019-08-06 LAB — GLUCOSE, CAPILLARY
Glucose-Capillary: 194 mg/dL — ABNORMAL HIGH (ref 70–99)
Glucose-Capillary: 175 mg/dL — ABNORMAL HIGH (ref 70–99)
Glucose-Capillary: 144 mg/dL — ABNORMAL HIGH (ref 70–99)
Glucose-Capillary: 135 mg/dL — ABNORMAL HIGH (ref 70–99)

## 2019-08-06 LAB — PROTIME-INR
INR: 4.1 (ref 0.8–1.2)
Prothrombin Time: 39.3 seconds — ABNORMAL HIGH (ref 11.4–15.2)

## 2019-08-06 LAB — LACTIC ACID, PLASMA: Lactic Acid, Venous: 9.4 mmol/L (ref 0.5–1.9)

## 2019-08-06 LAB — HEPATITIS PANEL, ACUTE
HCV Ab: <0.1 s/co ratio (ref 0.0–0.9)
Hep A IgM: NEGATIVE
Hep B C IgM: NEGATIVE
Hepatitis B Surface Ag: NEGATIVE

## 2019-08-06 LAB — PHOSPHORUS: Phosphorus: 2.6 mg/dL (ref 2.5–4.6)

## 2019-08-06 LAB — ACETAMINOPHEN LEVEL: Acetaminophen (Tylenol), Serum: <10 ug/mL — ABNORMAL LOW (ref 10–30)

## 2019-08-06 LAB — PATHOLOGIST SMEAR REVIEW

## 2019-08-06 MED ORDER — SODIUM CHLORIDE 0.9 % IV SOLN
2.0000 g | INTRAVENOUS | Status: DC
  Administered 2019-08-06: 22:00:00 2 g via INTRAVENOUS
  Filled 2019-08-06: qty 20

## 2019-08-06 MED ORDER — FENTANYL CITRATE (PF) 100 MCG/2ML IJ SOLN
25.0000 ug | INTRAMUSCULAR | Status: DC | PRN
  Administered 2019-08-06 – 2019-08-10 (×10): 100 ug via INTRAVENOUS
  Filled 2019-08-06 (×6): qty 2

## 2019-08-06 MED ORDER — B COMPLEX-C PO TABS
1.0000 | ORAL_TABLET | Freq: Every day | ORAL | Status: DC
  Administered 2019-08-06 – 2019-08-14 (×9): 1
  Filled 2019-08-06 (×9): qty 1

## 2019-08-06 MED ORDER — VITAL AF 1.2 CAL PO LIQD
1000.0000 mL | ORAL | Status: DC
  Administered 2019-08-06 – 2019-08-10 (×5): 1000 mL
  Filled 2019-08-06: qty 1000

## 2019-08-06 MED ORDER — INSULIN ASPART 100 UNIT/ML ~~LOC~~ SOLN
0.0000 [IU] | SUBCUTANEOUS | Status: DC
  Administered 2019-08-06 (×2): 1 [IU] via SUBCUTANEOUS
  Administered 2019-08-06: 2 [IU] via SUBCUTANEOUS
  Administered 2019-08-06: 16:00:00 1 [IU] via SUBCUTANEOUS
  Administered 2019-08-06: 09:00:00 2 [IU] via SUBCUTANEOUS
  Administered 2019-08-06: 3 [IU] via SUBCUTANEOUS
  Administered 2019-08-06 – 2019-08-07 (×2): 2 [IU] via SUBCUTANEOUS
  Administered 2019-08-07: 3 [IU] via SUBCUTANEOUS
  Administered 2019-08-07 (×2): 2 [IU] via SUBCUTANEOUS
  Administered 2019-08-07: 1 [IU] via SUBCUTANEOUS
  Administered 2019-08-07 – 2019-08-08 (×3): 2 [IU] via SUBCUTANEOUS
  Administered 2019-08-08: 1 [IU] via SUBCUTANEOUS
  Administered 2019-08-08: 3 [IU] via SUBCUTANEOUS
  Administered 2019-08-08: 2 [IU] via SUBCUTANEOUS
  Administered 2019-08-09 (×4): 3 [IU] via SUBCUTANEOUS
  Administered 2019-08-09: 04:00:00 2 [IU] via SUBCUTANEOUS
  Administered 2019-08-09 (×2): 3 [IU] via SUBCUTANEOUS
  Administered 2019-08-10: 20:00:00 2 [IU] via SUBCUTANEOUS
  Administered 2019-08-10 (×2): 3 [IU] via SUBCUTANEOUS
  Administered 2019-08-10: 2 [IU] via SUBCUTANEOUS
  Administered 2019-08-10 (×2): 3 [IU] via SUBCUTANEOUS
  Administered 2019-08-11: 03:00:00 1 [IU] via SUBCUTANEOUS
  Administered 2019-08-11: 08:00:00 2 [IU] via SUBCUTANEOUS
  Administered 2019-08-11: 1 [IU] via SUBCUTANEOUS
  Administered 2019-08-11 (×2): 2 [IU] via SUBCUTANEOUS
  Administered 2019-08-12 (×2): 1 [IU] via SUBCUTANEOUS
  Administered 2019-08-12: 2 [IU] via SUBCUTANEOUS
  Administered 2019-08-12: 1 [IU] via SUBCUTANEOUS
  Administered 2019-08-12 – 2019-08-13 (×4): 2 [IU] via SUBCUTANEOUS
  Administered 2019-08-13 (×2): 1 [IU] via SUBCUTANEOUS
  Administered 2019-08-13 (×2): 2 [IU] via SUBCUTANEOUS
  Administered 2019-08-14: 7 [IU] via SUBCUTANEOUS
  Administered 2019-08-14: 1 [IU] via SUBCUTANEOUS
  Administered 2019-08-14: 3 [IU] via SUBCUTANEOUS
  Administered 2019-08-14 (×2): 2 [IU] via SUBCUTANEOUS

## 2019-08-06 MED ORDER — PRO-STAT SUGAR FREE PO LIQD
30.0000 mL | Freq: Two times a day (BID) | ORAL | Status: DC
  Administered 2019-08-06 – 2019-08-10 (×9): 30 mL
  Filled 2019-08-06 (×9): qty 30

## 2019-08-06 MED ORDER — ATROPINE SULFATE 1 MG/10ML IJ SOSY
PREFILLED_SYRINGE | INTRAMUSCULAR | Status: AC
  Administered 2019-08-06: 0.5 mg
  Filled 2019-08-06: qty 10

## 2019-08-06 MED ORDER — CALCIUM GLUCONATE-NACL 2-0.675 GM/100ML-% IV SOLN
2.0000 g | Freq: Once | INTRAVENOUS | Status: AC
  Administered 2019-08-06: 11:00:00 2000 mg via INTRAVENOUS
  Filled 2019-08-06: qty 100

## 2019-08-06 MED ORDER — SODIUM CHLORIDE 0.9 % IV BOLUS
1000.0000 mL | Freq: Once | INTRAVENOUS | Status: AC
  Administered 2019-08-06: 18:00:00 1000 mL via INTRAVENOUS

## 2019-08-06 MED ORDER — LACTATED RINGERS IV BOLUS
1000.0000 mL | Freq: Once | INTRAVENOUS | Status: AC
  Administered 2019-08-06: 15:00:00 1000 mL via INTRAVENOUS

## 2019-08-06 MED ORDER — LACTULOSE 10 GM/15ML PO SOLN
20.0000 g | Freq: Two times a day (BID) | ORAL | Status: DC
  Administered 2019-08-06 (×2): 20 g via ORAL
  Filled 2019-08-06 (×3): qty 30

## 2019-08-06 NOTE — Consult Note (Signed)
Referring Provider:  Dr. Ann Lions Primary Care Physician:  McVey, Gelene Mink, PA-C Primary Gastroenterologist: None (unassigned)  Reason for Consultation: Liver failure  HPI: Victoria Osborne is a 58 y.o. female with history of significant ethanol abuse found unresponsive at home with 9 empty vodka bottles nearby.  She was brought to the emergency room in an obtunded state.  Since admission, she has been intubated and is on pressors, with great difficulty maintaining even a moderate blood pressure.  Her labs are markedly abnormal, with bilirubin of 33, transaminases in the [phone] range with a marked OT/PT split, creatinine 2.9, INR 4.1, white count 54,000, normal platelets, steatosis but no obvious cirrhosis on abdominal ultrasound.  NG tube clear.   Past Medical History:  Diagnosis Date  . Anxiety   . Depression   . Restless leg syndrome   . Substance abuse [facility])     Past Surgical History:  Procedure Laterality Date  . CESAREAN SECTION      Prior to Admission medications   Medication Sig Start Date End Date Taking? Authorizing Provider  ALPRAZolam (XANAX) 0.5 MG tablet TAKE 1 TABLET BY MOUTH AT BEDTIME AS NEEDED FOR ANXIETY Patient taking differently: Take 0.5 mg by mouth as needed for anxiety.  11/26/18  Yes McVey, Gelene Mink, PA-C  calcium carbonate (TUMS - DOSED IN MG ELEMENTAL CALCIUM) 500 MG chewable tablet Chew 1 tablet by mouth 2 (two) times daily as needed for indigestion or heartburn.    Yes [provider]  FLUoxetine (PROZAC) 40 MG capsule TAKE 1 CAPSULE (40 MG TOTAL) BY MOUTH DAILY. Patient taking differently: Take 40 mg by mouth daily.  12/10/17  Yes McVey, Gelene Mink, PA-C  ibuprofen (ADVIL) 800 MG tablet Take 800 mg by mouth every 6 (six) hours as needed for pain. 06/30/19  Yes [provider]  omeprazole (PRILOSEC) 20 MG capsule Take 20 mg by mouth 2 (two) times daily as needed (heartburn).    Yes [provider]     Current Facility-Administered Medications  Medication Dose Route Frequency Provider Last Rate Last Dose  .  prismasol BGK 4/2.5 infusion   CRRT Continuous Justin Mend, MD 300 mL/hr at 08/06/19 0820    .  prismasol BGK 4/2.5 infusion   CRRT Continuous Justin Mend, MD 400 mL/hr at 08/06/19 0402    . 0.9 %  sodium chloride infusion  250 mL Intravenous Continuous Renee Pain, MD 37.5 mL/hr at 08/05/19 1100    . B-complex with vitamin C tablet 1 tablet  1 tablet Per Tube Daily Brand Males, MD   1 tablet at 08/06/19 1223  . cefTRIAXone (ROCEPHIN) 2 g in sodium chloride 0.9 % 100 mL IVPB  2 g Intravenous Q24H Brand Males, MD      . chlorhexidine gluconate (MEDLINE KIT) (PERIDEX) 0.12 % solution 15 mL  15 mL Mouth Rinse BID [name], MD   15 mL at 08/06/19 0858  . Chlorhexidine Gluconate Cloth 2 % PADS 6 each  6 each Topical Daily Renee Pain, MD      . feeding supplement (PRO-STAT SUGAR FREE 64) liquid 30 mL  30 mL Per Tube BID Brand Males, MD   30 mL at 08/06/19 1223  . feeding supplement (VITAL AF 1.2 CAL) liquid 1,000 mL  1,000 mL Per Tube Continuous Brand Males, MD 20 mL/hr at 08/06/19 1500    . fentaNYL (SUBLIMAZE) injection 25-100 mcg  25-100 mcg Intravenous Q1H PRN Brand Males, MD      .  folic acid injection 1 mg  1 mg Intravenous Daily [name], MD   1 mg at 08/06/19 0814  . heparin injection 1,000-6,000 Units  1,000-6,000 Units CRRT PRN Justin Mend, MD      . insulin aspart (novoLOG) injection 0-9 Units  0-9 Units Subcutaneous Q4H Deterding, Guadelupe Sabin, MD   1 Units at 08/06/19 1121  . lactulose (CHRONULAC) 10 GM/15ML solution 20 g  20 g Oral BID Brand Males, MD   20 g at 08/06/19 1019  . MEDLINE mouth rinse  15 mL Mouth Rinse 10 times per day [name], MD   15 mL at 08/06/19 1505  . methylPREDNISolone sodium succinate (SOLU-MEDROL) 125 mg/2 mL injection 60 mg  60 mg Intravenous Q12H [name], MD    60 mg at 08/06/19 0630  . midazolam (VERSED) injection 1-2 mg  1-2 mg Intravenous Q2H PRN Deterding, Guadelupe Sabin, MD   2 mg at 08/06/19 0318  . norepinephrine (LEVOPHED) 16 mg in 270m premix infusion  0-40 mcg/min Intravenous Titrated Steenwyk, Yujing Z, RPH 32.8 mL/hr at 08/06/19 1500 35 mcg/min at 08/06/19 1500  . pantoprazole (PROTONIX) injection 40 mg  40 mg Intravenous QHS JRenee Pain MD   40 mg at 08/05/19 2218  . phenylephrine (NEOSYNEPHRINE) 40-0.9 MG/250ML-% infusion  0-400 mcg/min Intravenous Titrated SCandee Furbish MD 18.75 mL/hr at 08/06/19 1500 50 mcg/min at 08/06/19 1500  . prismasol BGK 4/2.5 infusion   CRRT Continuous KJustin Mend MD 1,500 mL/hr at 08/06/19 1505    . sodium bicarbonate 150 mEq in dextrose 5% 1000 mL infusion  150 mEq Intravenous Continuous SCandee Furbish MD 125 mL/hr at 08/06/19 1500    . thiamine (B-1) injection 100 mg  100 mg Intravenous Q24H JRenee Pain MD   100 mg at 08/06/19 0908  . vasopressin (PITRESSIN) 40 Units in sodium chloride 0.9 % 250 mL (0.16 Units/mL) infusion  0.04 Units/min Intravenous Continuous JRenee Pain MD 15 mL/hr at 08/06/19 1500 0.04 Units/min at 08/06/19 1500    Allergies as of 08/17/2019  . (No Known Allergies)    Family History  Problem Relation Age of Onset  . Depression Mother   . Diabetes Sister     Social History   Socioeconomic History  . Marital status: Married    Spouse name: Not on file  . Number of children: Not on file  . Years of education: Not on file  . Highest education level: Not on file  Occupational History  . Occupation: TPharmacist, hospital Social Needs  . Financial resource strain: Not on file  . Food insecurity    Worry: Not on file    Inability: Not on file  . Transportation needs    Medical: Not on file    Non-medical: Not on file  Tobacco Use  . Smoking status: Former SResearch scientist (life sciences) . Smokeless tobacco: Never Used  Substance and Sexual Activity  . Alcohol use: Yes     Alcohol/week: 10.0 standard drinks    Types: 10 Standard drinks or equivalent per week    Comment: Rare  . Drug use: No  . Sexual activity: Yes    Birth control/protection: Condom  Lifestyle  . Physical activity    Days per week: Not on file    Minutes per session: Not on file  . Stress: Not on file  Relationships  . Social [place] phone: Not on file    Gets together: Not on file  Attends religious service: Not on file    Active member of club or organization: Not on file    Attends meetings of clubs or organizations: Not on file    Relationship status: Not on file  . Intimate partner violence    Fear of current or ex partner: Not on file    Emotionally abused: Not on file    Physically abused: Not on file    Forced sexual activity: Not on file  Other Topics Concern  . Not on file  Social History Narrative   Significant other   Education: College      Exercise: Yes    Review of Systems: Unobtainable, patient intubated  Physical Exam: Vital signs in last 24 hours: Temp:  [97.3 F (36.3 C)-98.2 F (36.8 C)] 98.2 F (36.8 C) (09/04 1115) Pulse Rate:  [52-89] 80 (09/04 1515) Resp:  [10-28] 14 (09/04 1515) BP: (82-131)/(57-102) 99/68 (09/04 1400) SpO2:  [87 %-100 %] 100 % (09/04 1515) Arterial Line BP: (84-128)/(43-73) 105/53 (09/04 1515) FiO2 (%):  [40 %-60 %] 40 % (09/04 1503) Weight:  [73.2 kg] 73.2 kg (09/04 0251) Last BM Date: (pta) General: Sedated on ventilator Head:  Normocephalic and atraumatic. Lungs: Grossly clear to auscultation.  Heart: Somewhat rapid regular rate ; no murmurs, clicks, rubs,  or gallops. Abdomen:  Soft, nontender overtly, and somewhat adipose but not overtly distended. No masses, hepatosplenomegaly or ventral hernias noted.  No obvious ascites. Msk:   Symmetrical without gross deformities. Extremities:   No lower extremity edema to speak of at the time of my exam Neurologic: Obtunded/sedated Skin: Severely jaundiced  but no spider angiomata noted Psych: Unable to assess, obtunded/sedated  Intake/Output from previous day: 09/03 0701 - 09/04 0700 In: 7561.5 [I.V.:6136.4; IV Piggyback:1425.1] Out: 3557 [Urine:95; Emesis/NG output:500] Intake/Output this shift: Total I/O In: 2129.4 [I.V.:1505.9; NG/GT:270; IV Piggyback:353.5] Out: 1864 [Urine:10; Other:1854]  Lab Results: Recent Labs    08/05/19 1010  08/05/19 1513 08/06/19 0240 08/06/19 0614 08/06/19 1133  WBC 53.8*  --  49.1* 45.9*  --   --   HGB 8.1*   < > 7.3* 7.6* 8.5* 7.8*  HCT 23.0*   < > 21.0* 20.9* 25.0* 23.0*  PLT 145*  --  111* 91*  --   --    < > = values in this interval not displayed.   BMET Recent Labs    08/05/19 1513 08/05/19 1800 08/06/19 0240 08/06/19 0614 08/06/19 1133  NA 139 140 136 138 140  K 2.6* 2.6* 4.0 3.8 3.6  CL 100 103 99  --   --   CO2 12* 13* 17*  --   --   GLUCOSE 242* 235* 225*  --   --   BUN 71* 57* 40*  --   --   CREATININE 4.93* 3.96* 2.89*  --   --   CALCIUM 5.9* 5.7* 6.9*  --   --    LFT Recent Labs    08/06/19 0240  PROT 5.9*  ALBUMIN 2.9*  2.8*  AST 1,452*  ALT 227*  ALKPHOS 274*  BILITOT 33.1*  BILIDIR 23.3*  IBILI 9.8*   PT/INR Recent Labs    08/05/19 1010 08/06/19 0240  LABPROT 30.5* 39.3*  INR 3.0* 4.1*    Studies/Results: Dg Chest 1 View  Result Date: 08/05/2019 CLINICAL DATA:  Post catheter placement, right-sided EXAM: CHEST  1 VIEW COMPARISON:  08/05/2019 FINDINGS: Interval placement of a right neck vascular catheter, tip positioned near the superior cavoatrial junction. Other  support apparatus including endotracheal tube, esophagogastric tube, and left neck vascular catheter are unchanged. Cardiomegaly with heterogeneous opacity of the retrocardiac left lung. No new airspace opacity. IMPRESSION: Interval placement of a right neck vascular catheter, tip positioned near the superior cavoatrial junction. Electronically Signed   By: Eddie Candle M.D.   On: 08/05/2019  13:02   Dg Chest 1 View  Result Date: 08/05/2019 CLINICAL DATA:  ETT placement EXAM: CHEST  1 VIEW COMPARISON:  08/05/2019, 7:09 a.m. FINDINGS: Interval placement of endotracheal tube, tip positioned over the mid trachea. Interval placement of esophagogastric tube, tip and side port below the diaphragm. Left neck vascular catheter remains in position. New heterogeneous retrocardiac opacity, which may reflect atelectasis or airspace disease. IMPRESSION: 1. Interval placement of endotracheal tube, tip positioned over the mid trachea. Interval placement of esophagogastric tube, tip and side port below the diaphragm. 2. New heterogeneous retrocardiac opacity, which may reflect atelectasis or airspace disease. Electronically Signed   By: Eddie Candle M.D.   On: 08/05/2019 08:49   Dg Abd 1 View  Result Date: 08/05/2019 CLINICAL DATA:  Nasogastric tube placement. EXAM: ABDOMEN - 1 VIEW COMPARISON:  None. FINDINGS: Distal tip of nasogastric tube is seen in expected position of distal stomach. Air-filled large and small bowel is noted within the visualized portion of the upper abdomen. Small bowel may be mildly dilated suggesting possible ileus or obstruction. IMPRESSION: Nasogastric tube tip seen in expected position of distal stomach. Air-filled large and small bowel is noted, with mild dilatation of the small bowel suggesting possible ileus or obstruction. Electronically Signed   By: Marijo Conception M.D.   On: 08/05/2019 08:50   US Renal  Result Date: 08/06/2019 CLINICAL DATA:  Acute kidney injury.  Substance abuse. EXAM: RENAL / URINARY TRACT ULTRASOUND COMPLETE COMPARISON:  CT of the abdomen and pelvis on 03/28/2018 FINDINGS: Right Kidney: Renal measurements: 13.0 x 6.7 x 7.3 centimeter = volume: 300.7 mL. Trace amount of perinephric fluid. Thin renal parenchyma. No mass or hydronephrosis. Left Kidney: Renal measurements: 12.3 x 7.0 x 8.0 centimeters = volume: 360.6 mL. Thin renal parenchyma. No mass or  hydronephrosis. Bladder: A Foley catheter decompresses the bladder. IMPRESSION: No hydronephrosis or suspicious renal mass. Small amount of RIGHT perinephric fluid. Electronically Signed   By: Nolon Nations M.D.   On: 08/06/2019 13:44   Dg Chest Port 1 View  Result Date: 08/05/2019 CLINICAL DATA:  Central line placement. EXAM: PORTABLE CHEST 1 VIEW COMPARISON:  Chest x-ray from yesterday. FINDINGS: New left internal jugular central venous catheter with tip in the mid SVC. The heart size and mediastinal contours are within normal limits. Normal pulmonary vascularity. No focal consolidation, pleural effusion, or pneumothorax. No acute osseous abnormality. IMPRESSION: 1. New left internal jugular central venous catheter without complicating feature. No active disease. Electronically Signed   By: Titus Dubin M.D.   On: 08/05/2019 07:34   Dg Chest Port 1 View  Result Date: 08/07/2019 CLINICAL DATA:  Drug overdose. EXAM: PORTABLE CHEST 1 VIEW COMPARISON:  Single-view of the chest 03/11/2018. FINDINGS: Lung volumes are low. Lungs are clear. No pneumothorax or pleural effusion heart size is normal. No acute or focal bony abnormality. IMPRESSION: Negative chest. Electronically Signed   By: Inge Rise M.D.   On: 08/06/2019 20:39   Dg Abd Portable 1v  Result Date: 08/06/2019 CLINICAL DATA:  Abdominal distension.  Septic shock. EXAM: PORTABLE ABDOMEN - 1 VIEW COMPARISON:  Radiographs 08/05/2019.  CT 03/28/2018. FINDINGS: 1045 hours. Two  views obtained. The nasogastric tube is unchanged in position, with tip in the expected location of the distal stomach. Minimal colonic distention in the mid abdomen is unchanged. There is no bowel wall thickening or supine evidence of free air. IMPRESSION: Stable position of the nasogastric tube. Nonobstructive bowel gas pattern. Electronically Signed   By: Richardean Sale M.D.   On: 08/06/2019 11:23   US Abdomen Limited Ruq  Result Date: 09/01/2019 CLINICAL DATA:   Jaundice EXAM: ULTRASOUND ABDOMEN LIMITED RIGHT UPPER QUADRANT COMPARISON:  CT dated March 28, 2018. FINDINGS: Gallbladder: There is gallbladder wall thickening with the gallbladder wall measuring approximately 1 cm in thickness. The sonographic Percell Miller sign is negative. There are few small gallstones with gallbladder sludge. Common bile duct: Diameter: 5 mm Liver: Insert fat portal vein is patent on color Doppler imaging with normal direction of blood flow towards the liver. Other: None. IMPRESSION: 1. Cholelithiasis with gallbladder wall thickening. There are no additional findings to suggest a diagnosis of acute cholecystitis. If there is clinical concern for acute cholecystitis, follow-up with HIDA scan is recommended. 2. Hepatic steatosis. Electronically Signed   By: Constance Holster M.D.   On: 08/30/2019 23:12    Impression: Liver failure which appears to be primarily severe alcoholic hepatitis, but with the AST in such a high range, I think there is an element of superimposed shock liver as well.  The normal platelet count and the ultrasound appearance of the liver would go somewhat against underlying cirrhosis.  Plan: 1.  Continue supportive care 2.  No clear preference for methylprednisolone per NG tube versus high-dose IV Solu-Medrol as she is currently receiving. 3.  Prognosis extremely grave.  Meld score is 46, markedly elevated.  Discriminant function test 157 (normal under 32). 4.  Extensive talk for about 20 minutes with the patient's son, Merry Proud, in the hallway.  I think it is reasonable to provide supportive care and see if the patient declares herself 1 way or the other over the next day or 2.  I think there is a high probability she will not survive this hospitalization.  If she remains stable but not improving, decisions would have to be made about goals of care.   LOS: 2 days   Youlanda Mighty Buccini  08/06/2019, 3:40 PM   Pager [phone] If no answer or after 5 PM call [phone]

## 2019-08-06 NOTE — Progress Notes (Signed)
Paged Dr. Chase Caller regarding patient's blood pressure which is currently 94/47 MAP 61. Map goal >65. Informed him that she has had increasing pressor requirement despite net zero on CRRT. Noted significant increase in peripheral and abdominal edema. Order received for 1L LR bolus IV.  Adonis Brook, BSN, RN 42M/60M MICU

## 2019-08-06 NOTE — Progress Notes (Signed)
NAME:  Victoria Osborne, MRN:  [id number], DOB:  [birth year], LOS: 2 ADMISSION DATE:  08/14/2019, CONSULTATION DATE:  08/18/2019 REFERRING MD:  ER, CHIEF COMPLAINT:  AMS   Brief History    "This 58 y.o. Caucasian female smoker presented to the [facility] Emergency Department via EMS with complaints of altered mental status.  The patient's boyfriend Jimmie Molly "[facility]")  is at the bedside and reports that he found her lying on the floor at home, unresponsive with approximately 9 empty bottles of vodka.  He reports that she has been visibly jaundiced for over a week, as he saw her about a week ago, when she was jaundiced but mentating at baseline. At the time of clinical interview, the patient is awake, moaning and groaning constantly, uncooperative but protecting her airway.  She is known to be a heavy drinker and has previously experienced alcohol withdrawal during hospitalizations.  The patient's boyfriend states that, while he has assumed that she has liver cirrhosis, he is unaware that the patient has ever been formally diagnosed with any chronic liver disease.  As far as health problems are concerned, he is only aware of heavy vaginal bleeding as her only other health concern."  Past Medical History  Anxiety/depression EtOH abuse   Consults:  None  Procedures:  9/3 Central line, A-line, intubation, foley  Significant Diagnostic Tests:  CXR 9/2 NAD  Micro Data:  Blood cultures 9/2 NGTD - klebsiella   Antimicrobials:  Vanc 9/2>>> 9/4 Cefepime 9/2>>> 9/4, ceftriaxone 94 >>  EVENTS  08/24/2019 - admit 08/05/2019 - On arrival to the unit, this patient was obtunded and tachypneic in the 30s.  She was not protecting her airway.  Intubated and OG tube placed with removal of 600 cc of dark bilious fluid.   SUBJECTIVE/OVERNIGHT/INTERVAL HX    08/06/2019 - on 40% fio2, on levophed, vasopressin, neo. On fent gtt.Per RN -> GCS 3. Has gag and PEERL but no corneals. On  bair hugger.. Anuric on CRRT. BCID with klebsiella   Objective   Blood pressure (!) 131/102, pulse 86, temperature (!) 97.3 F (36.3 C), temperature source Axillary, resp. rate (!) 28, height 5\' 7"  (1.702 m), weight 73.2 kg, SpO2 99 %.    Vent Mode: PRVC FiO2 (%):  [40 %-60 %] 40 % Set Rate:  [5 bmp-28 bmp] 28 bmp Vt Set:  [410 mL-670 mL] 490 mL PEEP:  [5 cmH20-8 cmH20] 8 cmH20 Pressure Support:  [12 cmH20] 12 cmH20 Plateau Pressure:  [17 cmH20-25 cmH20] 22 cmH20   Intake/Output Summary (Last 24 hours) at 08/06/2019 0937 Last data filed at 08/06/2019 0900 Gross per 24 hour  Intake 6793.82 ml  Output 3683 ml  Net 3110.82 ml   Filed Weights   08/14/2019 2100 08/05/19 0120 08/06/19 0251  Weight: 65.8 kg 73 kg 73.2 kg    General Appearance:  Looks criticall ill. JAUNDICED Head:  Normocephalic, without obvious abnormality, atraumatic Eyes:  PERRL - yes, conjunctiva/corneas - NO CORNEAL PER RN. JAUNDICED     Ears:  Normal external ear canals, both ears Nose:  G tube - no Throat:  ETT TUBE - yes , OG tube - yes Neck:  Supple,  No enlargement/tenderness/nodules Lungs: Clear to auscultation bilaterally, Ventilator   Synchrony - yes Heart:  S1 and S2 normal, no murmur, CVP - no.  Pressors - yes  Abdomen:  Soft, no masses, no organomegaly Genitalia / Rectal:  Not done Extremities:  Extremities- intact Skin:  ntact in exposed areas .  Sacral area - not examined Neurologic:  Sedation - fent gtt -> RASS - -5   Resolved Hospital Problem list     Assessment & Plan:  # alcoholic hepatitis-Madri discriminant score 109 indicating severe life-threatening disease    - 08/06/2019 -  Worsening LFT and INR ? Shock liver  Plan - check ammonia level - check tylenol and salicylate level  - GI consult  - start lactoluse  - contniue steroids - but GI to advise on switch from solumedrl to prednisolone   # abd tenderness- suspicion this is liver tenderness and reactive ileus, check KUB but low  threshold for CT scan given lack of lactic acid clearance  08/06/2019 - abd not rigid. NG returns to LIS is clear  Plan  - repeat KUB -   # acute respiratory failure secondary to inability to protect airway as well as severe acidosis   08/06/2019 - does not meet sbt criteria  Plan  - full vent support  - reduce RR to 16 and check ABG - VAP  # shock, distributive versus septic- cortisol okay,   08/06/2019 - on triple pressors  Plan  - MAP goal > 65  # acute kidney injury with metabolic acidosis mixed gap/nongap-inability to clear lactate is most likely related to hepatic failure  08/06/2019 - on bic gtt and CRRT  Plan  - crrt per renal - check abg  # acute metabolic encephalopathy secondary to critical illness and hepatic failure, moving all 4 ext on exam, ammonia 241  08/06/2019 - comatose  Plan chagne to prn sedation Cancel head ct - too unstable for transfer per RN  # hepatic coagulopathy # heavy alcohol abuse  - 08/06/2019 - worsening shock liver  plan -recheck INR  #Septic shock - klebsiella bacteremia  Plan  - change abx to ceftriaxone   -Very poor prognosis,   Best practice:  Diet: NPO, OGT to LIS Pain/Anxiety/Delirium protocol (if indicated): Precedex, PRN fentanyl VAP protocol (if indicated): ordred DVT prophylaxis: SCDs for now given coagulopathy and thrombocytopenia GI prophylaxis: PPI Glucose control: CBG alone for now Mobility: BR Code Status: Full Family Communication:  Son updated over phone Disposition: ICU     ATTESTATION & SIGNATURE   The patient Victoria Osborne is critically ill with multiple organ systems failure and requires high complexity decision making for assessment and support, frequent evaluation and titration of therapies, application of advanced monitoring technologies and extensive interpretation of multiple databases.   Critical Care Time devoted to patient care services described in this note is  60  Minutes. This  time reflects time of care of this signee Dr Brand Males. This critical care time does not reflect procedure time, or teaching time or supervisory time of PA/NP/Med student/Med Resident etc but could involve care discussion time     Dr. Brand Males, M.D., [facility] Pulmonary and Critical Care Medicine Staff Physician [place] [facility] Pager: [phone], If no answer or between  15:00h - 7:00h: call [phone]  08/06/2019 9:46 AM   LABS    PULMONARY Recent Labs  Lab 08/05/19 0850 08/05/19 1024 08/05/19 1432 08/06/19 0614  PHART 7.075* 7.182* 7.295* 7.452*  PCO2ART 33.0 38.1 24.7* 27.5*  PO2ART 332* 405.0* 145.0* 229.0*  HCO3 9.3* 14.3* 12.0* 19.2*  TCO2  --  16* 13* 20*  O2SAT 98.7 100.0 99.0 100.0    CBC Recent Labs  Lab 08/05/19 1010  08/05/19 1513 08/06/19 0240 08/06/19 [id number]  HGB 8.1*   < > 7.3* 7.6* 8.5*  HCT 23.0*   < > 21.0* 20.9* 25.0*  WBC 53.8*  --  49.1* 45.9*  --   PLT 145*  --  111* 91*  --    < > = values in this interval not displayed.    COAGULATION Recent Labs  Lab 08/03/2019 2011 08/05/19 1010 08/06/19 0240  INR 3.0* 3.0* 4.1*    CARDIAC  No results for input(s): TROPONINI in the last 168 hours. No results for input(s): PROBNP in the last 168 hours.   CHEMISTRY Recent Labs  Lab 08/05/19 0255 08/05/19 1010  08/05/19 1432 08/05/19 1513 08/05/19 1800 08/06/19 0240 08/06/19 0614  NA 140 141   < > 140 139 140 136 138  K 2.4* 2.2*   < > 2.7* 2.6* 2.6* 4.0 3.8  CL 102 102  --   --  100 103 99  --   CO2 <7* 12*  --   --  12* 13* 17*  --   GLUCOSE 116* 96  --   --  242* 235* 225*  --   BUN 79* 73*  --   --  71* 57* 40*  --   CREATININE 5.58* 5.28*  --   --  4.93* 3.96* 2.89*  --   CALCIUM 6.0* 6.2*  --   --  5.9* 5.7* 6.9*  --   MG 1.8  --   --   --   --   --  1.9  --   PHOS 7.4*  --   --   --   --  4.4 4.3  --    < > = values in this interval not displayed.   Estimated Creatinine  Clearance: 20.9 mL/min (A) (by C-G formula based on SCr of 2.89 mg/dL (H)).   LIVER Recent Labs  Lab 08/09/2019 2011 08/05/19 0255 08/05/19 1010 08/05/19 1513 08/05/19 1800 08/06/19 0240  AST 318* 642* 1,130* 1,353*  --  1,452*  ALT 90* 124* 161* 181*  --  227*  ALKPHOS 328* 277* 288* 262*  --  274*  BILITOT 27.1* 25.1* 28.1* 27.5*  --  33.1*  PROT 5.2* 4.9* 5.1* 4.9*  --  5.9*  ALBUMIN 1.7* 1.6* 2.0* 2.0* 2.3* 2.9*  2.8*  INR 3.0*  --  3.0*  --   --  4.1*     INFECTIOUS Recent Labs  Lab 08/24/2019 2252 08/05/19 0441 08/05/19 1513  LATICACIDVEN >11.0* >11.0* 10.9*     ENDOCRINE CBG (last 3)  Recent Labs    08/05/19 2357 08/06/19 0344 08/06/19 0755  GLUCAP 211* 194* 175*         IMAGING x48h  - image(s) personally visualized  -   highlighted in bold Dg Chest 1 View  Result Date: 08/05/2019 CLINICAL DATA:  Post catheter placement, right-sided EXAM: CHEST  1 VIEW COMPARISON:  08/05/2019 FINDINGS: Interval placement of a right neck vascular catheter, tip positioned near the superior cavoatrial junction. Other support apparatus including endotracheal tube, esophagogastric tube, and left neck vascular catheter are unchanged. Cardiomegaly with heterogeneous opacity of the retrocardiac left lung. No new airspace opacity. IMPRESSION: Interval placement of a right neck vascular catheter, tip positioned near the superior cavoatrial junction. Electronically Signed   By: Eddie Candle M.D.   On: 08/05/2019 13:02   Dg Chest 1 View  Result Date: 08/05/2019 CLINICAL DATA:  ETT placement EXAM: CHEST  1 VIEW COMPARISON:  08/05/2019, 7:09 a.m. FINDINGS: Interval placement of endotracheal tube,  tip positioned over the mid trachea. Interval placement of esophagogastric tube, tip and side port below the diaphragm. Left neck vascular catheter remains in position. New heterogeneous retrocardiac opacity, which may reflect atelectasis or airspace disease. IMPRESSION: 1. Interval placement of  endotracheal tube, tip positioned over the mid trachea. Interval placement of esophagogastric tube, tip and side port below the diaphragm. 2. New heterogeneous retrocardiac opacity, which may reflect atelectasis or airspace disease. Electronically Signed   By: Eddie Candle M.D.   On: 08/05/2019 08:49   Dg Abd 1 View  Result Date: 08/05/2019 CLINICAL DATA:  Nasogastric tube placement. EXAM: ABDOMEN - 1 VIEW COMPARISON:  None. FINDINGS: Distal tip of nasogastric tube is seen in expected position of distal stomach. Air-filled large and small bowel is noted within the visualized portion of the upper abdomen. Small bowel may be mildly dilated suggesting possible ileus or obstruction. IMPRESSION: Nasogastric tube tip seen in expected position of distal stomach. Air-filled large and small bowel is noted, with mild dilatation of the small bowel suggesting possible ileus or obstruction. Electronically Signed   By: Marijo Conception M.D.   On: 08/05/2019 08:50   Dg Chest Port 1 View  Result Date: 08/05/2019 CLINICAL DATA:  Central line placement. EXAM: PORTABLE CHEST 1 VIEW COMPARISON:  Chest x-ray from yesterday. FINDINGS: New left internal jugular central venous catheter with tip in the mid SVC. The heart size and mediastinal contours are within normal limits. Normal pulmonary vascularity. No focal consolidation, pleural effusion, or pneumothorax. No acute osseous abnormality. IMPRESSION: 1. New left internal jugular central venous catheter without complicating feature. No active disease. Electronically Signed   By: Titus Dubin M.D.   On: 08/05/2019 07:34   Dg Chest Port 1 View  Result Date: 08/20/2019 CLINICAL DATA:  Drug overdose. EXAM: PORTABLE CHEST 1 VIEW COMPARISON:  Single-view of the chest 03/11/2018. FINDINGS: Lung volumes are low. Lungs are clear. No pneumothorax or pleural effusion heart size is normal. No acute or focal bony abnormality. IMPRESSION: Negative chest. Electronically Signed   By: Inge Rise M.D.   On: 09/01/2019 20:39   US Abdomen Limited Ruq  Result Date: 08/29/2019 CLINICAL DATA:  Jaundice EXAM: ULTRASOUND ABDOMEN LIMITED RIGHT UPPER QUADRANT COMPARISON:  CT dated March 28, 2018. FINDINGS: Gallbladder: There is gallbladder wall thickening with the gallbladder wall measuring approximately 1 cm in thickness. The sonographic Percell Miller sign is negative. There are few small gallstones with gallbladder sludge. Common bile duct: Diameter: 5 mm Liver: Insert fat portal vein is patent on color Doppler imaging with normal direction of blood flow towards the liver. Other: None. IMPRESSION: 1. Cholelithiasis with gallbladder wall thickening. There are no additional findings to suggest a diagnosis of acute cholecystitis. If there is clinical concern for acute cholecystitis, follow-up with HIDA scan is recommended. 2. Hepatic steatosis. Electronically Signed   By: Constance Holster M.D.   On: 08/22/2019 23:12

## 2019-08-06 NOTE — Progress Notes (Signed)
Patient HR dropped to 30's - low 40's. CBG was drawn prior to this occurring however no further interventions or changes were made to patient. Associated hypotension with this bradycardia, BP 50/33 via arterial line with appropriate waveform. SpO2 also dropped to 50's. Administered 0.5mg  atropine IV as per [facility] for symptomatic bradycardia. Patient immediately responded with HR to 80's, BP 138/79, and SpO2 return to 100%.   Adonis Brook, BSN, RN 59M/42M MICU

## 2019-08-06 NOTE — Progress Notes (Signed)
Evening rounds  MODS continues Occ movement on prn sedation No lactate clearance  Plan  - d/w son - agrees for no cpr  - continue full active care - repeat fluid bolus  - check PCT  Grim prognosis    SIGNATURE    Dr. Brand Males, M.D., F.C.C.P,  Pulmonary and Critical Care Medicine Staff Physician, [place] Director - Interstitial Lung Disease  Program  Pulmonary [place] [facility] [place], [place], [address]  Pager: [phone], If no answer or between  15:00h - 7:00h: call [phone] Telephone: [phone]  5:34 PM 08/06/2019

## 2019-08-06 NOTE — Progress Notes (Signed)
Wasted 150 ML of Fentanyl with Lilian Kapur, RN.

## 2019-08-06 NOTE — Progress Notes (Addendum)
[facility] Progress Note Patient Name: Victoria Osborne DOB: Oct 09, 1961 MRN: [id number]   Date of Service  08/06/2019  HPI/Events of Note  Notified by RN regarding his discussion with the patient's son about code status.  The patient has partial code status.    eICU Interventions  Son is ok for the patient to receive ACLS meds but no chest compressions.  Code status changed to reflect this.      Intervention Category Minor Interventions: Other:  [name] 08/06/2019, 10:13 PM   5:45 AM Informed by RN that patient has been requiring fentanyl every hour for sedation. She is also getting versed prn. Fentanyl infusion was stopped previously as the patient was felt to be too deeply sedated.  Plan> Restart fentanyl gtt.  Reassess need for infusion during morning rounds.

## 2019-08-06 NOTE — Progress Notes (Signed)
Code status noted by RN does not corollate with what was mentioned during nurse to nurse report at shift change. It was agreed that the son wants ACLS drugs to be administered along with pressors and vent support. In the chart it was noted "Yes" only for vent support. RN called patient's son, Merry Proud to confirm with him the code status of his mother and he did mention that he wants ACLS medications, pressor, and vent support. E- link RN notified and will notify E-link MD.

## 2019-08-06 NOTE — Progress Notes (Addendum)
[facility] NEPHROLOGY PROGRESS NOTE  Assessment/ Plan: Pt is a 58 y.o. yo female with history of alcohol abuse presented after being found unresponsive, jaundice, we are consulted to manage AKI.  Creatinine level 6 on admission.  #Severe AKI, oliguric: Presumed ATN from shock, acute hepatitis.  Urine sodium high therefore less likely hepatorenal syndrome.  UA with no RBC, has protein and UTI.  Kidney ultrasound pending.  Started CRRT placement of right IJ temporary catheter by ICU on 9/3.  All 4K, no net UF because of hypotension.  #Acute alcoholic hepatitis, severe: On steroid.  Supportive care per PCCM.  #Shock: On multiple pressor agent including Levophed, phenylephrine, vasopressin.  Also receiving albumin.  #Anion gap metabolic acidosis: Due to lactate and AKI.  Continue CRRT.  #Hypokalemia: Potassium level acceptable.  Now on 4K bath.  #Hypocalcemia: Replete ca.  Monitor lab.  #Ventilator dependent respiratory failure: On vent, FiO2 40%.  Per PCCM.  #Coagulopathy due to liver failure: INR 4.1.  Discussed with ICU team and nurse.  Subjective: Seen and examined in ICU.  Patient is sedated and intubated.  Tolerating CRRT well.  On multiple pressors.  No net UF. Objective Vital signs in last 24 hours: Vitals:   08/06/19 0502 08/06/19 0600 08/06/19 0749 08/06/19 0800  BP:  111/77  (!) 131/102  Pulse:  (!) 55 63 86  Resp:  (!) 28 (!) 28 (!) 28  Temp:    (!) 97.3 F (36.3 C)  TempSrc:    Axillary  SpO2: 97% 98% 100% 99%  Weight:      Height:       Weight change: 7.429 kg  Intake/Output Summary (Last 24 hours) at 08/06/2019 0954 Last data filed at 08/06/2019 0900 Gross per 24 hour  Intake 6793.82 ml  Output 3683 ml  Net 3110.82 ml       Labs: Basic Metabolic Panel: Recent Labs  Lab 08/05/19 0255  08/05/19 1513 08/05/19 1800 08/06/19 0240 08/06/19 0614  NA 140   < > 139 140 136 138  K 2.4*   < > 2.6* 2.6* 4.0 3.8  CL 102   < > 100 103 99  --    CO2 <7*   < > 12* 13* 17*  --   GLUCOSE 116*   < > 242* 235* 225*  --   BUN 79*   < > 71* 57* 40*  --   CREATININE 5.58*   < > 4.93* 3.96* 2.89*  --   CALCIUM 6.0*   < > 5.9* 5.7* 6.9*  --   PHOS 7.4*  --   --  4.4 4.3  --    < > = values in this interval not displayed.   Liver Function Tests: Recent Labs  Lab 08/05/19 1010 08/05/19 1513 08/05/19 1800 08/06/19 0240  AST 1,130* 1,353*  --  1,452*  ALT 161* 181*  --  227*  ALKPHOS 288* 262*  --  274*  BILITOT 28.1* 27.5*  --  33.1*  PROT 5.1* 4.9*  --  5.9*  ALBUMIN 2.0* 2.0* 2.3* 2.9*  2.8*   Recent Labs  Lab 08/24/2019 2011 08/05/19 2203  LIPASE 179* 104*   Recent Labs  Lab 08/13/2019 2011  AMMONIA 241*   CBC: Recent Labs  Lab 08/12/2019 2305 08/05/19 0255 08/05/19 1010  08/05/19 1513 08/06/19 0240 08/06/19 0614  WBC 46.2* 47.8* 53.8*  --  49.1* 45.9*  --   NEUTROABS  --   --  50.6*  --  42.5*  --   --  HGB 9.1* 8.3* 8.1*   < > 7.3* 7.6* 8.5*  HCT 27.1* 24.2* 23.0*   < > 21.0* 20.9* 25.0*  MCV 112.0* 110.5* 109.0*  --  108.8* 105.6*  --   PLT 107* 104* 145*  --  111* 91*  --    < > = values in this interval not displayed.   Cardiac Enzymes: Recent Labs  Lab 08/05/19 1010  CKTOTAL 115   CBG: Recent Labs  Lab 08/05/19 1339 08/05/19 1957 08/05/19 2357 08/06/19 0344 08/06/19 0755  GLUCAP 181* 171* 211* 194* 175*    Iron Studies: No results for input(s): IRON, TIBC, TRANSFERRIN, FERRITIN in the last 72 hours. Studies/Results: Dg Chest 1 View  Result Date: 08/05/2019 CLINICAL DATA:  Post catheter placement, right-sided EXAM: CHEST  1 VIEW COMPARISON:  08/05/2019 FINDINGS: Interval placement of a right neck vascular catheter, tip positioned near the superior cavoatrial junction. Other support apparatus including endotracheal tube, esophagogastric tube, and left neck vascular catheter are unchanged. Cardiomegaly with heterogeneous opacity of the retrocardiac left lung. No new airspace opacity. IMPRESSION:  Interval placement of a right neck vascular catheter, tip positioned near the superior cavoatrial junction. Electronically Signed   By: Eddie Candle M.D.   On: 08/05/2019 13:02   Dg Chest 1 View  Result Date: 08/05/2019 CLINICAL DATA:  ETT placement EXAM: CHEST  1 VIEW COMPARISON:  08/05/2019, 7:09 a.m. FINDINGS: Interval placement of endotracheal tube, tip positioned over the mid trachea. Interval placement of esophagogastric tube, tip and side port below the diaphragm. Left neck vascular catheter remains in position. New heterogeneous retrocardiac opacity, which may reflect atelectasis or airspace disease. IMPRESSION: 1. Interval placement of endotracheal tube, tip positioned over the mid trachea. Interval placement of esophagogastric tube, tip and side port below the diaphragm. 2. New heterogeneous retrocardiac opacity, which may reflect atelectasis or airspace disease. Electronically Signed   By: Eddie Candle M.D.   On: 08/05/2019 08:49   Dg Abd 1 View  Result Date: 08/05/2019 CLINICAL DATA:  Nasogastric tube placement. EXAM: ABDOMEN - 1 VIEW COMPARISON:  None. FINDINGS: Distal tip of nasogastric tube is seen in expected position of distal stomach. Air-filled large and small bowel is noted within the visualized portion of the upper abdomen. Small bowel may be mildly dilated suggesting possible ileus or obstruction. IMPRESSION: Nasogastric tube tip seen in expected position of distal stomach. Air-filled large and small bowel is noted, with mild dilatation of the small bowel suggesting possible ileus or obstruction. Electronically Signed   By: Marijo Conception M.D.   On: 08/05/2019 08:50   Dg Chest Port 1 View  Result Date: 08/05/2019 CLINICAL DATA:  Central line placement. EXAM: PORTABLE CHEST 1 VIEW COMPARISON:  Chest x-ray from yesterday. FINDINGS: New left internal jugular central venous catheter with tip in the mid SVC. The heart size and mediastinal contours are within normal limits. Normal pulmonary  vascularity. No focal consolidation, pleural effusion, or pneumothorax. No acute osseous abnormality. IMPRESSION: 1. New left internal jugular central venous catheter without complicating feature. No active disease. Electronically Signed   By: Titus Dubin M.D.   On: 08/05/2019 07:34   Dg Chest Port 1 View  Result Date: 08/11/2019 CLINICAL DATA:  Drug overdose. EXAM: PORTABLE CHEST 1 VIEW COMPARISON:  Single-view of the chest 03/11/2018. FINDINGS: Lung volumes are low. Lungs are clear. No pneumothorax or pleural effusion heart size is normal. No acute or focal bony abnormality. IMPRESSION: Negative chest. Electronically Signed   By: Inge Rise M.D.  On: 08/24/2019 20:39   US Abdomen Limited Ruq  Result Date: 08/21/2019 CLINICAL DATA:  Jaundice EXAM: ULTRASOUND ABDOMEN LIMITED RIGHT UPPER QUADRANT COMPARISON:  CT dated March 28, 2018. FINDINGS: Gallbladder: There is gallbladder wall thickening with the gallbladder wall measuring approximately 1 cm in thickness. The sonographic Percell Miller sign is negative. There are few small gallstones with gallbladder sludge. Common bile duct: Diameter: 5 mm Liver: Insert fat portal vein is patent on color Doppler imaging with normal direction of blood flow towards the liver. Other: None. IMPRESSION: 1. Cholelithiasis with gallbladder wall thickening. There are no additional findings to suggest a diagnosis of acute cholecystitis. If there is clinical concern for acute cholecystitis, follow-up with HIDA scan is recommended. 2. Hepatic steatosis. Electronically Signed   By: Constance Holster M.D.   On: 08/08/2019 23:12    Medications: Infusions: .  prismasol BGK 4/2.5 300 mL/hr at 08/06/19 0820  .  prismasol BGK 4/2.5 400 mL/hr at 08/06/19 0402  . sodium chloride 37.5 mL/hr at 08/05/19 1100  . ceFEPime (MAXIPIME) IV 2 g (08/06/19 0911)  . fentaNYL infusion INTRAVENOUS 50 mcg/hr (08/06/19 0900)  . norepinephrine (LEVOPHED) Adult infusion 35 mcg/min (08/06/19  0900)  . phenylephrine (NEO-SYNEPHRINE) Adult infusion 40 mcg/min (08/06/19 0900)  . prismasol BGK 4/2.5 1,500 mL/hr at 08/06/19 0816  . sodium bicarbonate 150 mEq in dextrose 5% 1000 mL 150 mEq (08/06/19 0904)  . vancomycin Stopped (08/06/19 0131)  . vasopressin (PITRESSIN) infusion - *FOR SHOCK* 0.04 Units/min (08/06/19 0900)    Scheduled Medications: . chlorhexidine gluconate (MEDLINE KIT)  15 mL Mouth Rinse BID  . Chlorhexidine Gluconate Cloth  6 each Topical Daily  . fentaNYL (SUBLIMAZE) injection  50 mcg Intravenous Once  . folic acid  1 mg Intravenous Daily  . insulin aspart  0-9 Units Subcutaneous Q4H  . mouth rinse  15 mL Mouth Rinse 10 times per day  . methylPREDNISolone (SOLU-MEDROL) injection  60 mg Intravenous Q12H  . pantoprazole (PROTONIX) IV  40 mg Intravenous QHS  . rocuronium  80 mg Intravenous Once  . thiamine injection  100 mg Intravenous Q24H    have reviewed scheduled and prn medications.  Physical Exam: General: Sedated, intubated, Heart:RRR, s1s2 nl no rubs Lungs: Coarse breath sound bilateral, no wheeze Abdomen:soft,  non-distended Extremities:No edema Dialysis Access: Right IJ temporary dialysis catheter placed on 9/3 by PCCM. Skin: Icteric Neuro: Sedated   Rosita Fire 08/06/2019,9:54 AM  LOS: 2 days  Pager: [phone]

## 2019-08-06 NOTE — Progress Notes (Signed)
 Nutrition Follow-up   RD working remotely.  DOCUMENTATION CODES:   Not applicable  INTERVENTION:   Tube Feeding:  Initiate trickle TF of Vital AF 1.2 at 20 ml/hr Titrate by 10 mL q 8 hours until goal rate of 55 ml/hr; hold for any signs of GI intolerance Goal rate: Vital AF 1.2 at 55 ml/hr Pro-Stat 30 mL BID Provides 1640 kcals, 120 g of protein and 972 mL of free water Meets 100% of estimated calorie and protein needs at goal rate   Add B-complex with C while on CRRT   NUTRITION DIAGNOSIS:   Inadequate oral intake related to acute illness as evidenced by NPO status.  Being addressed via TF   GOAL:   Patient will meet greater than or equal to 90% of their needs  Progressing  MONITOR:   TF tolerance, Vent status, Labs, Weight trends  REASON FOR ASSESSMENT:   Ventilator    ASSESSMENT:   58 yo female admitted with severe alcoholic hepatitis, acute respiratory failure with inability to protect airway requiring intubation, shock requiring pressors, AKI with acidosis,  PMH EtOH abuse, anxiety, depression  9/02 Admit, Intubated 9/03 CRRT initiated 9/04 TF initiated  Pt remains on CRRT, very poor prognosis per MD notes Patient remains intubated on ventilator support, pt requiring 3 pressors, rates relatively stable; fentanyl for pain/sedation, sodium bicarb drip MV: 9.2 L/min Temp (24hrs), Avg:98.2 F (36.8 C), Min:97.3 F (36.3 C), Max:98.7 F (37.1 C)  After discussion with CCM MD, trickle TF ordered yesterday. Later discontinued by RN. RD received consult this AM for enteral nutrition initiation and management.   Abdominal xray today with OG tube with tip in stomach and nonobstructive bowel gas pattern  Labs: BUN 40, Creatinine 2.89, lipase 104, CBGs 171-211 (ICU goal [id number]) Meds: folic acid, ss novolog, lactulose, solumedrol, sodium bicarb at 125 ml/hr, thiamine   Diet Order:   Diet Order            Diet NPO time specified  Diet effective now              EDUCATION NEEDS:   Not appropriate for education at this time  Skin:  Skin Assessment: Skin Integrity Issues: Skin Integrity Issues:: Stage I, DTI DTI: heel Stage I: sacrum  Last BM:  no documented BM  Height:   Ht Readings from Last 1 Encounters:  08/29/2019 5\' 7"  (1.702 m)    Weight:   Wt Readings from Last 1 Encounters:  08/06/19 73.2 kg    Ideal Body Weight:  61.4 kg  BMI:  Body mass index is 25.28 kg/m.  Estimated Nutritional Needs:   Kcal:  1584 kcals  Protein:  110-135 g  Fluid:  >/= 1.5 L     Youcef Klas MS, RDN, LDN, CNSC [phone] Pager  [phone] Weekend/On-Call Pager

## 2019-08-06 NOTE — Progress Notes (Signed)
CRITICAL VALUE ALERT  Critical Value: INR   Date & Time Notied:  08/06/19 06:10  Provider Notified: Warren Lacy MD  Orders Received/Actions taken: Waiting for orders

## 2019-08-07 ENCOUNTER — Inpatient Hospital Stay (HOSPITAL_COMMUNITY): Payer: BLUE CROSS/BLUE SHIELD

## 2019-08-07 DIAGNOSIS — N179 Acute kidney failure, unspecified: Secondary | ICD-10-CM

## 2019-08-07 LAB — HEPATIC FUNCTION PANEL
ALT: 282 U/L — ABNORMAL HIGH (ref 0–44)
AST: 1612 U/L — ABNORMAL HIGH (ref 15–41)
Albumin: 2.6 g/dL — ABNORMAL LOW (ref 3.5–5.0)
Alkaline Phosphatase: 259 U/L — ABNORMAL HIGH (ref 38–126)
Bilirubin, Direct: 21.6 mg/dL — ABNORMAL HIGH (ref 0.0–0.2)
Indirect Bilirubin: 13.2 mg/dL — ABNORMAL HIGH (ref 0.3–0.9)
Total Bilirubin: 34.8 mg/dL (ref 0.3–1.2)
Total Protein: 5.5 g/dL — ABNORMAL LOW (ref 6.5–8.1)

## 2019-08-07 LAB — RENAL FUNCTION PANEL
Albumin: 2.6 g/dL — ABNORMAL LOW (ref 3.5–5.0)
Albumin: 2.5 g/dL — ABNORMAL LOW (ref 3.5–5.0)
Anion gap: 14 (ref 5–15)
Anion gap: 10 (ref 5–15)
BUN: 17 mg/dL (ref 6–20)
BUN: 14 mg/dL (ref 6–20)
CO2: 24 mmol/L (ref 22–32)
CO2: 25 mmol/L (ref 22–32)
Calcium: 7 mg/dL — ABNORMAL LOW (ref 8.9–10.3)
Calcium: 7.4 mg/dL — ABNORMAL LOW (ref 8.9–10.3)
Chloride: 101 mmol/L (ref 98–111)
Chloride: 102 mmol/L (ref 98–111)
Creatinine, Ser: 1.5 mg/dL — ABNORMAL HIGH (ref 0.44–1.00)
Creatinine, Ser: 1.28 mg/dL — ABNORMAL HIGH (ref 0.44–1.00)
GFR calc Af Amer: 44 mL/min — ABNORMAL LOW (ref >60)
GFR calc Af Amer: 54 mL/min — ABNORMAL LOW (ref >60)
GFR calc non Af Amer: 38 mL/min — ABNORMAL LOW (ref >60)
GFR calc non Af Amer: 46 mL/min — ABNORMAL LOW (ref >60)
Glucose, Bld: 198 mg/dL — ABNORMAL HIGH (ref 70–99)
Glucose, Bld: 159 mg/dL — ABNORMAL HIGH (ref 70–99)
Phosphorus: 2.6 mg/dL (ref 2.5–4.6)
Phosphorus: 2.5 mg/dL (ref 2.5–4.6)
Potassium: 3.7 mmol/L (ref 3.5–5.1)
Potassium: 4 mmol/L (ref 3.5–5.1)
Sodium: 139 mmol/L (ref 135–145)
Sodium: 137 mmol/L (ref 135–145)

## 2019-08-07 LAB — PHOSPHORUS: Phosphorus: 2.5 mg/dL (ref 2.5–4.6)

## 2019-08-07 LAB — GLUCOSE, CAPILLARY
Glucose-Capillary: 147 mg/dL — ABNORMAL HIGH (ref 70–99)
Glucose-Capillary: 154 mg/dL — ABNORMAL HIGH (ref 70–99)
Glucose-Capillary: 160 mg/dL — ABNORMAL HIGH (ref 70–99)
Glucose-Capillary: 166 mg/dL — ABNORMAL HIGH (ref 70–99)
Glucose-Capillary: 179 mg/dL — ABNORMAL HIGH (ref 70–99)
Glucose-Capillary: 179 mg/dL — ABNORMAL HIGH (ref 70–99)
Glucose-Capillary: 219 mg/dL — ABNORMAL HIGH (ref 70–99)

## 2019-08-07 LAB — CBC
HCT: 20.4 % — ABNORMAL LOW (ref 36.0–46.0)
Hemoglobin: 7.1 g/dL — ABNORMAL LOW (ref 12.0–15.0)
MCH: 37.6 pg — ABNORMAL HIGH (ref 26.0–34.0)
MCHC: 34.8 g/dL (ref 30.0–36.0)
MCV: 107.9 fL — ABNORMAL HIGH (ref 80.0–100.0)
Platelets: 69 10*3/uL — ABNORMAL LOW (ref 150–400)
RBC: 1.89 MIL/uL — ABNORMAL LOW (ref 3.87–5.11)
RDW: 22.9 % — ABNORMAL HIGH (ref 11.5–15.5)
WBC: 44.7 10*3/uL — ABNORMAL HIGH (ref 4.0–10.5)
nRBC: 6.3 % — ABNORMAL HIGH (ref 0.0–0.2)

## 2019-08-07 LAB — MAGNESIUM
Magnesium: 2.3 mg/dL (ref 1.7–2.4)
Magnesium: 2.3 mg/dL (ref 1.7–2.4)

## 2019-08-07 LAB — PROTIME-INR
INR: 3.3 — ABNORMAL HIGH (ref 0.8–1.2)
Prothrombin Time: 32.9 seconds — ABNORMAL HIGH (ref 11.4–15.2)

## 2019-08-07 LAB — LACTIC ACID, PLASMA: Lactic Acid, Venous: 4.8 mmol/L (ref 0.5–1.9)

## 2019-08-07 LAB — AMMONIA: Ammonia: 85 umol/L — ABNORMAL HIGH (ref 9–35)

## 2019-08-07 LAB — PROCALCITONIN: Procalcitonin: 3.71 ng/mL

## 2019-08-07 MED ORDER — CALCIUM GLUCONATE-NACL 1-0.675 GM/50ML-% IV SOLN
1.0000 g | Freq: Once | INTRAVENOUS | Status: AC
  Administered 2019-08-07: 1000 mg via INTRAVENOUS
  Filled 2019-08-07: qty 50

## 2019-08-07 MED ORDER — SODIUM CHLORIDE 0.9 % IV SOLN
2.0000 g | INTRAVENOUS | Status: DC
  Administered 2019-08-07 – 2019-08-12 (×6): 2 g via INTRAVENOUS
  Filled 2019-08-07 (×6): qty 20

## 2019-08-07 MED ORDER — VITAMIN K1 10 MG/ML IJ SOLN
10.0000 mg | Freq: Once | INTRAVENOUS | Status: AC
  Administered 2019-08-07: 11:00:00 10 mg via INTRAVENOUS
  Filled 2019-08-07: qty 1

## 2019-08-07 MED ORDER — LACTULOSE 10 GM/15ML PO SOLN
20.0000 g | Freq: Three times a day (TID) | ORAL | Status: DC
  Administered 2019-08-07 – 2019-08-10 (×11): 20 g
  Filled 2019-08-07 (×10): qty 30

## 2019-08-07 MED ORDER — ATROPINE SULFATE 1 MG/10ML IJ SOSY
PREFILLED_SYRINGE | INTRAMUSCULAR | Status: AC
  Filled 2019-08-07: qty 10

## 2019-08-07 MED ORDER — MIDAZOLAM HCL 2 MG/2ML IJ SOLN
1.0000 mg | INTRAMUSCULAR | Status: DC | PRN
  Administered 2019-08-07: 1 mg via INTRAVENOUS

## 2019-08-07 MED ORDER — FENTANYL 2500MCG IN NS 250ML (10MCG/ML) PREMIX INFUSION
0.0000 ug/h | INTRAVENOUS | Status: DC
  Administered 2019-08-07: 20:00:00 200 ug/h via INTRAVENOUS
  Administered 2019-08-07: 06:00:00 50 ug/h via INTRAVENOUS
  Administered 2019-08-08: 100 ug/h via INTRAVENOUS
  Administered 2019-08-08: 200 ug/h via INTRAVENOUS
  Administered 2019-08-09 – 2019-08-10 (×2): 175 ug/h via INTRAVENOUS
  Filled 2019-08-07 (×6): qty 250

## 2019-08-07 NOTE — Progress Notes (Signed)
Patient more responsive and agitated requiring Fentanyl PRN pushes almost every hour. Patient is able to follow some commands but can get very impulsive at times. E-link MD notified. Orders given.

## 2019-08-07 NOTE — Progress Notes (Addendum)
NAME:  Victoria Osborne, MRN:  [id number], DOB:  [birth year], LOS: 3 ADMISSION DATE:  08/25/2019, CONSULTATION DATE:  9/2 REFERRING MD:  ER, CHIEF COMPLAINT:  AMS    Brief History   58 y/o F admitted 9/2 with reports of altered mental status.  The patient was found at home lying on the floor unresponsive with 9 empty bottles of vodka.  He had noted visible jaundice the week preceding admit.  Work up notable for GNR UTI, bacteremia (klebsiella on BCID).  Requiring mechanical ventilation, vasopressors and CVVHD.   Past Medical History  Anxiety  Depression  ETOH Abuse  Significant Hospital Events   9/02 Admit  9/03 Arrived to ICU obtunded, tachypneic > intubated 9/05 Remains on CVVHD  Consults:  PCCM   Procedures:  ETT 9/3 >>  Central line 9/3 >>  Aline 9/3 >>   Significant Diagnostic Tests:    Micro Data:  COVID 9/2 >> negative  BCID 9/2 >> klebsiella  BCx2 9/2 >> GNR >>  UC 9/3 >> 100k GNR >>   Antimicrobials:  Vanco 9/2 >> 9/4 Cefepime 9/2 >> 9/4  Ceftriaxone 9/4 >>   Interim history/subjective:  RN reports pt remains on low dose fentanyl gtt (54mcg), levophed, neosynephrine, vaso gtt's. CVVHD continues / keeping even.   Objective   Blood pressure 104/77, pulse 69, temperature 98.2 F (36.8 C), temperature source Axillary, resp. rate 12, height 5\' 7"  (1.702 m), weight 79.9 kg, SpO2 100 %.    Vent Mode: PRVC FiO2 (%):  [40 %] 40 % Set Rate:  [16 bmp] 16 bmp Vt Set:  [490 mL] 490 mL PEEP:  [8 cmH20] 8 cmH20 Plateau Pressure:  [10 cmH20-21 cmH20] 20 cmH20   Intake/Output Summary (Last 24 hours) at 08/07/2019 0827 Last data filed at 08/07/2019 0800 Gross per 24 hour  Intake 7681.76 ml  Output 5224 ml  Net 2457.76 ml   Filed Weights   08/05/19 0120 08/06/19 0251 08/07/19 0210  Weight: 73 kg 73.2 kg 79.9 kg    Examination: General: critically ill appearing female lying in bed on vent, CVVHD. Jaundice.  HEENT: MM pink/moist, ETT, jaundice / scleral icterus   Neuro: opens eyes, no follow commands, moves all extremities spontaneously CV: s1s2 rrr, no m/r/g PULM:  Non-labored on PRVC, lungs bilaterally coarse with rhonchi  GI: soft, bsx4 active  Extremities: warm/dry, 1+ generalized edema  Skin: no rashes or lesions  Resolved Hospital Problem list      Assessment & Plan:   Alcoholic Hepatitis  Shock Liver  P: Follow LFT's, coag's  Avoid hepatotoxic agents  Solumedrol 60 mg IV Q12 Guarded prognosis   Abdominal Tenderness -non-obs bowel gas pattern on KUB 9/4 Severe Malnutrition P: Follow intermittent KUB TF per Nutrition Follow frequent labs to observe for re-feeding syndrome  PPI  Septic Shock secondary to GNR UTI, Bacteremia   P: Levophed, neo, vaso for MAP > 65 Trend CVP  Continue rocephin  Follow cultures to maturity  LCB  Acute Hypoxic Respiratory Failure  P: PRVC 8cc/kg  Wean PEEP / FiO2 for sats > 90% PSV wean as tolerated  Mental status barrier to weaning / extubation   AKI Mixed AG / NGMA -inability to clear lactate due to hepatic failure  P: Nephrology appreciated, CVVHD  Trend BMP / urinary output Replace electrolytes as indicated Avoid nephrotoxic agents, ensure adequate renal perfusion Discontinue bicarbonate gtt   Acute Metabolic Encephalopathy  -in setting of hepatic failure  P: Supportive care Increase lactulose to 20 mg  TID PT  Follow neuro exam   Coagulopathy  -in setting of liver dysfunction Thrombocytopenia  P: Trend CBC  Monitor for bleeding   ETOH Abuse  P: Continue MVI, thiamine, folate   Best practice:  Diet: TF  Pain/Anxiety/Delirium protocol (if indicated): In place  VAP protocol (if indicated): In place  DVT prophylaxis: SCD's in setting of coagulopathy  GI prophylaxis: PPI  Glucose control: SSI, sensitive scale Mobility: Bed rest  Code Status: LCB  Family Communication: Will update son on arrival 9/5 Disposition: ICU   Labs   CBC: Recent Labs  Lab 08/05/19  0255 08/05/19 1010  08/05/19 1513 08/06/19 0240 08/06/19 0614 08/06/19 1133 08/07/19 0248  WBC 47.8* 53.8*  --  49.1* 45.9*  --   --  44.7*  NEUTROABS  --  50.6*  --  42.5*  --   --   --   --   HGB 8.3* 8.1*   < > 7.3* 7.6* 8.5* 7.8* 7.1*  HCT 24.2* 23.0*   < > 21.0* 20.9* 25.0* 23.0* 20.4*  MCV 110.5* 109.0*  --  108.8* 105.6*  --   --  107.9*  PLT 104* 145*  --  111* 91*  --   --  69*   < > = values in this interval not displayed.    Basic Metabolic Panel: Recent Labs  Lab 08/05/19 0255  08/05/19 1513 08/05/19 1800 08/06/19 0240 08/06/19 0614 08/06/19 1133 08/06/19 1210 08/06/19 1730 08/07/19 0248  NA 140   < > 139 140 136 138 140  --  139 139  K 2.4*   < > 2.6* 2.6* 4.0 3.8 3.6  --  3.9 3.7  CL 102   < > 100 103 99  --   --   --  100 101  CO2 <7*   < > 12* 13* 17*  --   --   --  22 24  GLUCOSE 116*   < > 242* 235* 225*  --   --   --  146* 198*  BUN 79*   < > 71* 57* 40*  --   --   --  22* 17  CREATININE 5.58*   < > 4.93* 3.96* 2.89*  --   --   --  2.01* 1.50*  CALCIUM 6.0*   < > 5.9* 5.7* 6.9*  --   --   --  7.2* 7.0*  MG 1.8  --   --   --  1.9  --   --  2.0 2.0 2.3  PHOS 7.4*  --   --  4.4 4.3  --   --  2.6 2.2* 2.6   < > = values in this interval not displayed.   GFR: Estimated Creatinine Clearance: 45 mL/min (A) (by C-G formula based on SCr of 1.5 mg/dL (H)). Recent Labs  Lab 08/05/19 0441 08/05/19 1010 08/05/19 1513 08/06/19 0240 08/06/19 0948 08/06/19 2342 08/07/19 0248  PROCALCITON  --   --   --   --   --   --  3.71  WBC  --  53.8* 49.1* 45.9*  --   --  44.7*  LATICACIDVEN >11.0*  --  10.9*  --  9.4* 4.8*  --     Liver Function Tests: Recent Labs  Lab 08/05/19 0255 08/05/19 1010 08/05/19 1513 08/05/19 1800 08/06/19 0240 08/06/19 1730 08/07/19 0248  AST 642* 1,130* 1,353*  --  1,452*  --  1,612*  ALT 124* 161* 181*  --  227*  --  282*  ALKPHOS 277* 288* 262*  --  274*  --  259*  BILITOT 25.1* 28.1* 27.5*  --  33.1*  --  34.8*  PROT 4.9*  5.1* 4.9*  --  5.9*  --  5.5*  ALBUMIN 1.6* 2.0* 2.0* 2.3* 2.9*  2.8* 2.7* 2.6*  2.6*   Recent Labs  Lab 08/16/2019 2011 08/05/19 2203  LIPASE 179* 104*   Recent Labs  Lab 08/17/2019 2011 08/07/19 0248  AMMONIA 241* 85*    ABG    Component Value Date/Time   PHART 7.403 08/06/2019 1133   PCO2ART 30.1 (L) 08/06/2019 1133   PO2ART 124.0 (H) 08/06/2019 1133   HCO3 18.9 (L) 08/06/2019 1133   TCO2 20 (L) 08/06/2019 1133   ACIDBASEDEF 5.0 (H) 08/06/2019 1133   O2SAT 99.0 08/06/2019 1133     Coagulation Profile: Recent Labs  Lab 08/14/2019 2011 08/05/19 1010 08/06/19 0240 08/07/19 0248  INR 3.0* 3.0* 4.1* 3.3*    Cardiac Enzymes: Recent Labs  Lab 08/05/19 1010  CKTOTAL 115    HbA1C: Hgb A1c MFr Bld  Date/Time Value Ref Range Status  08/03/2019 11:05 PM 4.7 (L) 4.8 - 5.6 % Final    Comment:    (NOTE) Pre diabetes:          5.7%-6.4% Diabetes:              >6.4% Glycemic control for   <7.0% adults with diabetes   02/27/2016 09:30 AM 5.3 <5.7 % Final    Comment:      For the purpose of screening for the presence of diabetes:   <5.7%       Consistent with the absence of diabetes 5.7-6.4 %   Consistent with increased risk for diabetes (prediabetes) >=6.5 %     Consistent with diabetes   This assay result is consistent with a decreased risk of diabetes.   Currently, no consensus exists regarding use of hemoglobin A1c for diagnosis of diabetes in children.   According to American Diabetes Association (ADA) guidelines, hemoglobin A1c <7.0% represents optimal control in non-pregnant diabetic patients. Different metrics may apply to specific patient populations. Standards of Medical Care in Diabetes (ADA).       CBG: Recent Labs  Lab 08/06/19 0755 08/06/19 1114 08/06/19 1556 08/06/19 2335 08/07/19 0350  GLUCAP 175* 144* 135* 179* 166*      Critical care time: 40 minutes     Noe Gens, NP-C [facility] Pgr: [phone] or  if no answer [phone] 08/07/2019, 9:13 AM

## 2019-08-07 NOTE — Progress Notes (Signed)
From the hepatic standpoint, the patient appears to be basically stable.  The most encouraging element in her blood work is that her INR has fallen to 3.3 in response to IV vitamin K, bringing it to a level basically where it was earlier in the hospitalization, rather than trending upward.  Her bilirubin and other liver chemistries are slightly worse today, but nothing dramatic.  The progressive drop in platelets is probably due to sepsis or factors other than her liver disease.  The patient remains on pressors (slightly weaned), still on the ventilator, sedated/obtunded, without evidence of GI bleeding from above or below.  Brief conversation at bedside with patient's son; the absence of overt worsening from the hepatic or hemodynamic standpoint is slightly encouraging but the patient's prognosis remains grave.  It does not appear that any interventions from the GI or hepatic standpoint will be needed in the short-term, so we will plan to follow this patient on an occasional basis, at a distance.  Therefore, please feel free to call us at any time if earlier input is needed.  Cleotis Nipper, M.D. Pager [phone] If no answer or after 5 PM call [phone]

## 2019-08-07 NOTE — Progress Notes (Signed)
[facility] NEPHROLOGY PROGRESS NOTE  Assessment/ Plan: Pt is a 58 y.o. yo female with history of alcohol abuse presented after being found unresponsive, jaundice, we are consulted to manage AKI.  Creatinine level 6 on admission.  #Severe AKI, oliguric: Presumed ATN from shock, acute hepatitis.  Urine sodium high therefore less likely hepatorenal syndrome.  UA with no RBC, has protein and UTI.  Kidney ultrasound with no hydronephrosis.  Started CRRT placement of right IJ temporary catheter by ICU on 9/3.  All 4K, no net UF because of hypotension.  Continue CRRT today.  May attempt some UF if blood pressure allows, discussed with ICU nurse.  #Acute alcoholic hepatitis, severe: On steroid.  Supportive care per PCCM.  Looks poor prognosis.  GI following  #Shock: On multiple pressor agent including Levophed, phenylephrine, vasopressin.  Blood pressure still soft.  #Anion gap metabolic acidosis: Due to lactate and AKI.  Continue CRRT.  Discontinue sodium bicarbonate.  #Hypokalemia: Potassium level acceptable.  4K bath.  #Hypocalcemia: Replete ca.  Monitor lab.  #Ventilator dependent respiratory failure: On vent.  Per PCCM.  #Coagulopathy due to liver failure:   Discussed with ICU team and nurse.  Subjective: Seen and examined in ICU.  Patient is sedated and intubated.  Tolerating CRRT well.  On multiple pressors.  No net UF.  Plan to continue CRRT. Objective Vital signs in last 24 hours: Vitals:   08/07/19 0700 08/07/19 0728 08/07/19 0800 08/07/19 0900  BP: 103/77 103/77 104/77 93/67  Pulse: 69 67 69 70  Resp: '16 16 12 13  '$ Temp:   (!) 97.5 F (36.4 C)   TempSrc:   Oral   SpO2: 100% 100% 100% 100%  Weight:      Height:       Weight change: 6.7 kg  Intake/Output Summary (Last 24 hours) at 08/07/2019 0941 Last data filed at 08/07/2019 0900 Gross per 24 hour  Intake 7725.13 ml  Output 5242 ml  Net 2483.13 ml       Labs: Basic Metabolic Panel: Recent Labs  Lab  08/06/19 0240  08/06/19 1133 08/06/19 1210 08/06/19 1730 08/07/19 0248  NA 136   < > 140  --  139 139  K 4.0   < > 3.6  --  3.9 3.7  CL 99  --   --   --  100 101  CO2 17*  --   --   --  22 24  GLUCOSE 225*  --   --   --  146* 198*  BUN 40*  --   --   --  22* 17  CREATININE 2.89*  --   --   --  2.01* 1.50*  CALCIUM 6.9*  --   --   --  7.2* 7.0*  PHOS 4.3  --   --  2.6 2.2* 2.6   < > = values in this interval not displayed.   Liver Function Tests: Recent Labs  Lab 08/05/19 1513  08/06/19 0240 08/06/19 1730 08/07/19 0248  AST 1,353*  --  1,452*  --  1,612*  ALT 181*  --  227*  --  282*  ALKPHOS 262*  --  274*  --  259*  BILITOT 27.5*  --  33.1*  --  34.8*  PROT 4.9*  --  5.9*  --  5.5*  ALBUMIN 2.0*   < > 2.9*  2.8* 2.7* 2.6*  2.6*   < > = values in this interval not displayed.   Recent Labs  Lab 08/07/2019 2011 08/05/19 2203  LIPASE 179* 104*   Recent Labs  Lab 08/09/2019 2011 08/07/19 0248  AMMONIA 241* 85*   CBC: Recent Labs  Lab 08/05/19 0255 08/05/19 1010  08/05/19 1513 08/06/19 0240 08/06/19 0614 08/06/19 1133 08/07/19 0248  WBC 47.8* 53.8*  --  49.1* 45.9*  --   --  44.7*  NEUTROABS  --  50.6*  --  42.5*  --   --   --   --   HGB 8.3* 8.1*   < > 7.3* 7.6* 8.5* 7.8* 7.1*  HCT 24.2* 23.0*   < > 21.0* 20.9* 25.0* 23.0* 20.4*  MCV 110.5* 109.0*  --  108.8* 105.6*  --   --  107.9*  PLT 104* 145*  --  111* 91*  --   --  69*   < > = values in this interval not displayed.   Cardiac Enzymes: Recent Labs  Lab 08/05/19 1010  CKTOTAL 115   CBG: Recent Labs  Lab 08/06/19 0755 08/06/19 1114 08/06/19 1556 08/06/19 2335 08/07/19 0350  GLUCAP 175* 144* 135* 179* 166*    Iron Studies: No results for input(s): IRON, TIBC, TRANSFERRIN, FERRITIN in the last 72 hours. Studies/Results: Dg Chest 1 View  Result Date: 08/05/2019 CLINICAL DATA:  Post catheter placement, right-sided EXAM: CHEST  1 VIEW COMPARISON:  08/05/2019 FINDINGS: Interval placement of a  right neck vascular catheter, tip positioned near the superior cavoatrial junction. Other support apparatus including endotracheal tube, esophagogastric tube, and left neck vascular catheter are unchanged. Cardiomegaly with heterogeneous opacity of the retrocardiac left lung. No new airspace opacity. IMPRESSION: Interval placement of a right neck vascular catheter, tip positioned near the superior cavoatrial junction. Electronically Signed   By: Eddie Candle M.D.   On: 08/05/2019 13:02   US Renal  Result Date: 08/06/2019 CLINICAL DATA:  Acute kidney injury.  Substance abuse. EXAM: RENAL / URINARY TRACT ULTRASOUND COMPLETE COMPARISON:  CT of the abdomen and pelvis on 03/28/2018 FINDINGS: Right Kidney: Renal measurements: 13.0 x 6.7 x 7.3 centimeter = volume: 300.7 mL. Trace amount of perinephric fluid. Thin renal parenchyma. No mass or hydronephrosis. Left Kidney: Renal measurements: 12.3 x 7.0 x 8.0 centimeters = volume: 360.6 mL. Thin renal parenchyma. No mass or hydronephrosis. Bladder: A Foley catheter decompresses the bladder. IMPRESSION: No hydronephrosis or suspicious renal mass. Small amount of RIGHT perinephric fluid. Electronically Signed   By: Nolon Nations M.D.   On: 08/06/2019 13:44   Dg Chest Port 1 View  Result Date: 08/07/2019 CLINICAL DATA:  Intubated patient. EXAM: PORTABLE CHEST 1 VIEW COMPARISON:  08/05/2019 FINDINGS: Endotracheal tube is 2.9 cm above the carina. Non tunneled dialysis catheter tip in the lower SVC. Left jugular central line is in the upper SVC region. Heart size is stable. Nasogastric tube extends into the abdomen but the tip is beyond the image. Negative for pneumothorax. Streaky perihilar densities and a few streaky densities in the lower lung. Streaky densities are suggestive for atelectasis. The medial left hemidiaphragm is again obscured. IMPRESSION: 1. Streaky lung densities are suggestive for atelectasis. 2. There may be mild consolidation or volume loss at the  medial left lung base. 3. Support apparatuses appear to be adequately positioned. Electronically Signed   By: Markus Daft M.D.   On: 08/07/2019 08:45   Dg Abd Portable 1v  Result Date: 08/06/2019 CLINICAL DATA:  Abdominal distension.  Septic shock. EXAM: PORTABLE ABDOMEN - 1 VIEW COMPARISON:  Radiographs 08/05/2019.  CT 03/28/2018. FINDINGS: 1045 hours.  Two views obtained. The nasogastric tube is unchanged in position, with tip in the expected location of the distal stomach. Minimal colonic distention in the mid abdomen is unchanged. There is no bowel wall thickening or supine evidence of free air. IMPRESSION: Stable position of the nasogastric tube. Nonobstructive bowel gas pattern. Electronically Signed   By: Richardean Sale M.D.   On: 08/06/2019 11:23    Medications: Infusions: .  prismasol BGK 4/2.5 300 mL/hr at 08/07/19 0124  .  prismasol BGK 4/2.5 400 mL/hr at 08/07/19 0502  . sodium chloride 10 mL/hr at 08/07/19 0900  . cefTRIAXone (ROCEPHIN)  IV Stopped (08/06/19 2224)  . feeding supplement (VITAL AF 1.2 CAL) 40 mL/hr at 08/07/19 0700  . fentaNYL infusion INTRAVENOUS 25 mcg/hr (08/07/19 0900)  . norepinephrine (LEVOPHED) Adult infusion 26 mcg/min (08/07/19 0900)  . phenylephrine (NEO-SYNEPHRINE) Adult infusion 20 mcg/min (08/07/19 0900)  . prismasol BGK 4/2.5 1,500 mL/hr at 08/07/19 0738  . vasopressin (PITRESSIN) infusion - *FOR SHOCK* 0.04 Units/min (08/07/19 0900)    Scheduled Medications: . atropine      . B-complex with vitamin C  1 tablet Per Tube Daily  . chlorhexidine gluconate (MEDLINE KIT)  15 mL Mouth Rinse BID  . Chlorhexidine Gluconate Cloth  6 each Topical Daily  . feeding supplement (PRO-STAT SUGAR FREE 64)  30 mL Per Tube BID  . folic acid  1 mg Intravenous Daily  . insulin aspart  0-9 Units Subcutaneous Q4H  . lactulose  20 g Per Tube TID  . mouth rinse  15 mL Mouth Rinse 10 times per day  . methylPREDNISolone (SOLU-MEDROL) injection  60 mg Intravenous Q12H  .  pantoprazole (PROTONIX) IV  40 mg Intravenous QHS    have reviewed scheduled and prn medications.  Physical Exam: General: Sedated, intubated, icteric Heart:RRR, s1s2 nl no rubs Lungs: Coarse breath sound bilateral, no wheeze Abdomen:soft,  non-distended Extremities:No edema Dialysis Access: Right IJ temporary dialysis catheter placed on 9/3 by PCCM. Skin: Icteric Neuro: Sedated   Rosita Fire 08/07/2019,9:41 AM  LOS: 3 days  Pager: [phone]

## 2019-08-08 ENCOUNTER — Inpatient Hospital Stay (HOSPITAL_COMMUNITY): Payer: BLUE CROSS/BLUE SHIELD

## 2019-08-08 DIAGNOSIS — J9601 Acute respiratory failure with hypoxia: Secondary | ICD-10-CM

## 2019-08-08 LAB — RENAL FUNCTION PANEL
Albumin: 2.5 g/dL — ABNORMAL LOW (ref 3.5–5.0)
Albumin: 2.3 g/dL — ABNORMAL LOW (ref 3.5–5.0)
Anion gap: 12 (ref 5–15)
Anion gap: 11 (ref 5–15)
BUN: 14 mg/dL (ref 6–20)
BUN: 16 mg/dL (ref 6–20)
CO2: 24 mmol/L (ref 22–32)
CO2: 23 mmol/L (ref 22–32)
Calcium: 7.4 mg/dL — ABNORMAL LOW (ref 8.9–10.3)
Calcium: 7.3 mg/dL — ABNORMAL LOW (ref 8.9–10.3)
Chloride: 102 mmol/L (ref 98–111)
Chloride: 104 mmol/L (ref 98–111)
Creatinine, Ser: 1.16 mg/dL — ABNORMAL HIGH (ref 0.44–1.00)
Creatinine, Ser: 1.13 mg/dL — ABNORMAL HIGH (ref 0.44–1.00)
GFR calc Af Amer: >60 mL/min (ref >60)
GFR calc Af Amer: >60 mL/min (ref >60)
GFR calc non Af Amer: 52 mL/min — ABNORMAL LOW (ref >60)
GFR calc non Af Amer: 54 mL/min — ABNORMAL LOW (ref >60)
Glucose, Bld: 171 mg/dL — ABNORMAL HIGH (ref 70–99)
Glucose, Bld: 231 mg/dL — ABNORMAL HIGH (ref 70–99)
Phosphorus: 3.2 mg/dL (ref 2.5–4.6)
Phosphorus: 2.6 mg/dL (ref 2.5–4.6)
Potassium: 3.9 mmol/L (ref 3.5–5.1)
Potassium: 3.8 mmol/L (ref 3.5–5.1)
Sodium: 138 mmol/L (ref 135–145)
Sodium: 138 mmol/L (ref 135–145)

## 2019-08-08 LAB — CULTURE, BLOOD (ROUTINE X 2): Special Requests: ADEQUATE

## 2019-08-08 LAB — BASIC METABOLIC PANEL
Anion gap: 13 (ref 5–15)
BUN: 13 mg/dL (ref 6–20)
CO2: 23 mmol/L (ref 22–32)
Calcium: 7.3 mg/dL — ABNORMAL LOW (ref 8.9–10.3)
Chloride: 101 mmol/L (ref 98–111)
Creatinine, Ser: 1.19 mg/dL — ABNORMAL HIGH (ref 0.44–1.00)
GFR calc Af Amer: 59 mL/min — ABNORMAL LOW (ref >60)
GFR calc non Af Amer: 51 mL/min — ABNORMAL LOW (ref >60)
Glucose, Bld: 173 mg/dL — ABNORMAL HIGH (ref 70–99)
Potassium: 3.9 mmol/L (ref 3.5–5.1)
Sodium: 137 mmol/L (ref 135–145)

## 2019-08-08 LAB — BPAM FFP
Blood Product Expiration Date: 202009082359
Blood Product Expiration Date: 202009082359
ISSUE DATE / TIME: 202009042054
ISSUE DATE / TIME: 202009042054
Unit Type and Rh: 7300
Unit Type and Rh: 7300

## 2019-08-08 LAB — HEPATIC FUNCTION PANEL
ALT: 298 U/L — ABNORMAL HIGH (ref 0–44)
AST: 1158 U/L — ABNORMAL HIGH (ref 15–41)
Albumin: 2.6 g/dL — ABNORMAL LOW (ref 3.5–5.0)
Alkaline Phosphatase: 314 U/L — ABNORMAL HIGH (ref 38–126)
Bilirubin, Direct: 26 mg/dL — ABNORMAL HIGH (ref 0.0–0.2)
Indirect Bilirubin: 14 mg/dL — ABNORMAL HIGH (ref 0.3–0.9)
Total Bilirubin: 40 mg/dL (ref 0.3–1.2)
Total Protein: 5.8 g/dL — ABNORMAL LOW (ref 6.5–8.1)

## 2019-08-08 LAB — CBC
HCT: 21.7 % — ABNORMAL LOW (ref 36.0–46.0)
Hemoglobin: 7.5 g/dL — ABNORMAL LOW (ref 12.0–15.0)
MCH: 37.5 pg — ABNORMAL HIGH (ref 26.0–34.0)
MCHC: 34.6 g/dL (ref 30.0–36.0)
MCV: 108.5 fL — ABNORMAL HIGH (ref 80.0–100.0)
Platelets: 51 10*3/uL — ABNORMAL LOW (ref 150–400)
RBC: 2 MIL/uL — ABNORMAL LOW (ref 3.87–5.11)
RDW: 23.1 % — ABNORMAL HIGH (ref 11.5–15.5)
WBC: 47.7 10*3/uL — ABNORMAL HIGH (ref 4.0–10.5)
nRBC: 5.6 % — ABNORMAL HIGH (ref 0.0–0.2)

## 2019-08-08 LAB — PREPARE FRESH FROZEN PLASMA
Unit division: 0
Unit division: 0

## 2019-08-08 LAB — GLUCOSE, CAPILLARY
Glucose-Capillary: 166 mg/dL — ABNORMAL HIGH (ref 70–99)
Glucose-Capillary: 148 mg/dL — ABNORMAL HIGH (ref 70–99)
Glucose-Capillary: 189 mg/dL — ABNORMAL HIGH (ref 70–99)
Glucose-Capillary: 210 mg/dL — ABNORMAL HIGH (ref 70–99)
Glucose-Capillary: 199 mg/dL — ABNORMAL HIGH (ref 70–99)

## 2019-08-08 LAB — URINE CULTURE: Culture: >=100000 — AB

## 2019-08-08 LAB — PROTIME-INR
INR: 2.2 — ABNORMAL HIGH (ref 0.8–1.2)
Prothrombin Time: 24.5 seconds — ABNORMAL HIGH (ref 11.4–15.2)

## 2019-08-08 LAB — MAGNESIUM: Magnesium: 2.4 mg/dL (ref 1.7–2.4)

## 2019-08-08 LAB — PROCALCITONIN: Procalcitonin: 2.88 ng/mL

## 2019-08-08 LAB — PHOSPHORUS: Phosphorus: 3.1 mg/dL (ref 2.5–4.6)

## 2019-08-08 NOTE — Progress Notes (Signed)
[facility] Progress Note Patient Name: Victoria Osborne DOB: Jun 13, 1961 MRN: [id number]   Date of Service  08/08/2019  HPI/Events of Note  Frequent watery diarrhea d/t Lactulose. Request for Flexiseal.   eICU Interventions  Will order Flexiseal.     Intervention Category Major Interventions: Other:  Lysle Dingwall 08/08/2019, 8:11 PM

## 2019-08-08 NOTE — Progress Notes (Addendum)
NAME:  Victoria Osborne, MRN:  [id number], DOB:  02-Apr-1961, LOS: 4 ADMISSION DATE:  08/07/2019, CONSULTATION DATE:  9/2 REFERRING MD:  ER, CHIEF COMPLAINT:  AMS    Brief History   58 y/o F admitted 9/2 with reports of altered mental status.  The patient was found at home lying on the floor unresponsive with 9 empty bottles of vodka.  He had noted visible jaundice the week preceding admit.  Work up notable for GNR UTI, bacteremia (klebsiella on BCID).  Requiring mechanical ventilation, vasopressors and CVVHD.   Past Medical History  Anxiety  Depression  ETOH Abuse  Significant Hospital Events   9/02 Admit  9/03 Arrived to ICU obtunded, tachypneic > intubated 9/05 CVVHD, fentanyl gtt (23mcg), levophed 26 mcg, neosynephrine, vaso gtt's 9/06 Levo down to 22, vaso, fent increased to 200 mcg  Consults:  PCCM   Procedures:  ETT 9/3 >>  Central line 9/3 >>  Aline 9/3 >>   Significant Diagnostic Tests:    Micro Data:  COVID 9/2 >> negative  BCID 9/2 >> klebsiella  BCx2 9/2 >> Klebsiella pneumoniae >> S-ceftriaxone UC 9/3 >> 100k GNR >>   Antimicrobials:  Vanco 9/2 >> 9/4 Cefepime 9/2 >> 9/4  Ceftriaxone 9/4 >>   Interim history/subjective:  RN reports no acute events overnight.  Vasopressors weaned to 22 mcg levophed, neo / vaso.  Remains on CVVHD, keeping even.   Objective   Blood pressure 98/63, pulse (!) 58, temperature 97.6 F (36.4 C), temperature source Axillary, resp. rate 16, height 5\' 7"  (1.702 m), weight 80.1 kg, SpO2 100 %.    Vent Mode: PRVC FiO2 (%):  [40 %] 40 % Set Rate:  [16 bmp] 16 bmp Vt Set:  [450 mL-490 mL] 490 mL PEEP:  [5 cmH20-8 cmH20] 5 cmH20 Plateau Pressure:  [18 cmH20-25 cmH20] 18 cmH20   Intake/Output Summary (Last 24 hours) at 08/08/2019 0747 Last data filed at 08/08/2019 0700 Gross per 24 hour  Intake 3625.2 ml  Output 3500 ml  Net 125.2 ml   Filed Weights   08/06/19 0251 08/07/19 0210 08/08/19 0320  Weight: 73.2 kg 79.9 kg 80.1 kg     Examination: General: chronically ill appearing female lying in bed on vent in NAD HEENT: MM pink/moist, ETT, jaundice / scleral icterus  Neuro: opens eyes to voice, looks at provider, ? If wiggled toes to command (slight movement), intermittent spontaneous movements  CV: s1s2 rrr, SEM heard best at 2nd ICS RSB PULM:  Even/non-labored, lungs bilaterally coarse  GI: soft, bsx4 hypoactive  Extremities: warm/dry, trace to 1+ edema  Skin: no rashes or lesions  Resolved Hospital Problem list      Assessment & Plan:   Alcoholic Hepatitis  Shock Liver  P: Follow LFT's, coag's  Continue solumedrol IV, ? Duration  Avoid hepatotoxic agents  GI following > last saw 9/5, following PRN  Abdominal Tenderness -non-obs bowel gas pattern on KUB 9/4 Severe Malnutrition P: TF per Nutrition  PPI   Septic Shock secondary to GNR UTI, Klebsiella Bacteremia   Leukocytosis  P: Vasopressors for MAP >65  Trend CVP  Rocephin for  Follow cultures to maturity  LCB   Acute Hypoxic Respiratory Failure  P: PRVC 8cc/kg  Wean PEEP / FiO2 for sats >90% PSV wean when able, mental status remains barrier to weaning / extubation   AKI Mixed AG / NGMA -inability to clear lactate due to hepatic failure  P: Nephrology input appreciated, CVVHD for event  Trend BMP /  urinary output Replace electrolytes as indicated Avoid nephrotoxic agents, ensure adequate renal perfusion  Acute Metabolic Encephalopathy  -in setting of hepatic failure  P: Supportive care  Lactulose 20 mg TID PT  Follow serial neuro exams   Coagulopathy  -in setting of liver dysfunction Thrombocytopenia  P: Trend CBC  Monitor for bleeding   ETOH Abuse  P: Continue thiamine, folate, MVI    Best practice:  Diet: TF  Pain/Anxiety/Delirium protocol (if indicated): In place  VAP protocol (if indicated): In place  DVT prophylaxis: SCD's in setting of coagulopathy  GI prophylaxis: PPI  Glucose control: SSI, sensitive  scale Mobility: Bed rest  Code Status: LCB  Family Communication: Family updated 9/5 per Dr. Elsworth Soho. Disposition: ICU   Labs   CBC: Recent Labs  Lab 08/05/19 1010  08/05/19 1513 08/06/19 0240 08/06/19 0614 08/06/19 1133 08/07/19 0248 08/08/19 0303  WBC 53.8*  --  49.1* 45.9*  --   --  44.7* 47.7*  NEUTROABS 50.6*  --  42.5*  --   --   --   --   --   HGB 8.1*   < > 7.3* 7.6* 8.5* 7.8* 7.1* 7.5*  HCT 23.0*   < > 21.0* 20.9* 25.0* 23.0* 20.4* 21.7*  MCV 109.0*  --  108.8* 105.6*  --   --  107.9* 108.5*  PLT 145*  --  111* 91*  --   --  69* 51*   < > = values in this interval not displayed.    Basic Metabolic Panel: Recent Labs  Lab 08/06/19 1210 08/06/19 1730 08/07/19 0248 08/07/19 1615 08/08/19 0303 08/08/19 0304  NA  --  139 139 137 137 138  K  --  3.9 3.7 4.0 3.9 3.9  CL  --  100 101 102 101 102  CO2  --  22 24 25 23 24   GLUCOSE  --  146* 198* 159* 173* 171*  BUN  --  22* 17 14 13 14   CREATININE  --  2.01* 1.50* 1.28* 1.19* 1.16*  CALCIUM  --  7.2* 7.0* 7.4* 7.3* 7.4*  MG 2.0 2.0 2.3 2.3 2.4  --   PHOS 2.6 2.2* 2.6 2.5  2.5 3.1 3.2   GFR: Estimated Creatinine Clearance: 58.3 mL/min (A) (by C-G formula based on SCr of 1.16 mg/dL (H)). Recent Labs  Lab 08/05/19 0441  08/05/19 1513 08/06/19 0240 08/06/19 0948 08/06/19 2342 08/07/19 0248 08/08/19 0303  PROCALCITON  --   --   --   --   --   --  3.71 2.88  WBC  --    < > 49.1* 45.9*  --   --  44.7* 47.7*  LATICACIDVEN >11.0*  --  10.9*  --  9.4* 4.8*  --   --    < > = values in this interval not displayed.    Liver Function Tests: Recent Labs  Lab 08/05/19 1010 08/05/19 1513  08/06/19 0240 08/06/19 1730 08/07/19 0248 08/07/19 1615 08/08/19 0303 08/08/19 0304  AST 1,130* 1,353*  --  1,452*  --  1,612*  --  1,158*  --   ALT 161* 181*  --  227*  --  282*  --  298*  --   ALKPHOS 288* 262*  --  274*  --  259*  --  314*  --   BILITOT 28.1* 27.5*  --  33.1*  --  34.8*  --  40.0*  --   PROT 5.1* 4.9*  --   5.9*  --  5.5*  --  5.8*  --   ALBUMIN 2.0* 2.0*   < > 2.9*  2.8* 2.7* 2.6*  2.6* 2.5* 2.6* 2.5*   < > = values in this interval not displayed.   Recent Labs  Lab 08/06/2019 2011 08/05/19 2203  LIPASE 179* 104*   Recent Labs  Lab 08/11/2019 2011 08/07/19 0248  AMMONIA 241* 85*    ABG    Component Value Date/Time   PHART 7.403 08/06/2019 1133   PCO2ART 30.1 (L) 08/06/2019 1133   PO2ART 124.0 (H) 08/06/2019 1133   HCO3 18.9 (L) 08/06/2019 1133   TCO2 20 (L) 08/06/2019 1133   ACIDBASEDEF 5.0 (H) 08/06/2019 1133   O2SAT 99.0 08/06/2019 1133     Coagulation Profile: Recent Labs  Lab 08/24/2019 2011 08/05/19 1010 08/06/19 0240 08/07/19 0248 08/08/19 0303  INR 3.0* 3.0* 4.1* 3.3* 2.2*    Cardiac Enzymes: Recent Labs  Lab 08/05/19 1010  CKTOTAL 115    HbA1C: Hgb A1c MFr Bld  Date/Time Value Ref Range Status  08/07/2019 11:05 PM 4.7 (L) 4.8 - 5.6 % Final    Comment:    (NOTE) Pre diabetes:          5.7%-6.4% Diabetes:              >6.4% Glycemic control for   <7.0% adults with diabetes   02/27/2016 09:30 AM 5.3 <5.7 % Final    Comment:      For the purpose of screening for the presence of diabetes:   <5.7%       Consistent with the absence of diabetes 5.7-6.4 %   Consistent with increased risk for diabetes (prediabetes) >=6.5 %     Consistent with diabetes   This assay result is consistent with a decreased risk of diabetes.   Currently, no consensus exists regarding use of hemoglobin A1c for diagnosis of diabetes in children.   According to American Diabetes Association (ADA) guidelines, hemoglobin A1c <7.0% represents optimal control in non-pregnant diabetic patients. Different metrics may apply to specific patient populations. Standards of Medical Care in Diabetes (ADA).       CBG: Recent Labs  Lab 08/07/19 1625 08/07/19 2012 08/07/19 2337 08/08/19 0315 08/08/19 0735  GLUCAP 147* 160* 154* 166* 148*      Critical care time: 35 minutes      Noe Gens, NP-C [facility] Pgr: [phone] or if no answer [phone] 08/08/2019, 7:47 AM

## 2019-08-08 NOTE — Progress Notes (Signed)
Victoria Osborne NEPHROLOGY PROGRESS NOTE  Assessment/ Plan: Pt is a 58 y.o. yo female with history of alcohol abuse presented after being found unresponsive, jaundice, we are consulted to manage AKI.  Creatinine level 6 on admission.  #Severe AKI, oliguric: Presumed ATN from shock, acute hepatitis.  Urine sodium high therefore less likely hepatorenal syndrome.  UA with no RBC, has protein and UTI.  Kidney ultrasound with no hydronephrosis.  Started CRRT after placement of right IJ temporary catheter by ICU on 9/3.  All 4K, no net UF because of hypotension.  Continue CRRT.  Remains anuric, may attempt some UF if blood pressure allows, discussed with ICU nurse.  #Acute alcoholic hepatitis, severe: On steroid.  Supportive care per PCCM.  Looks poor prognosis.  Bilirubin level worsening.  GI following  #Shock: On Levophed and vasopressin.  Off phenylephrine today.  Blood pressure still soft.  #Anion gap metabolic acidosis: Due to lactate and AKI.  Continue CRRT.  Discontinued sodium bicarbonate.  #Hypokalemia: Potassium level acceptable.  4K bath.  #Hypocalcemia: Replete ca.  Monitor lab.  #Ventilator dependent respiratory failure: On vent.  Per PCCM.  #Coagulopathy due to liver failure:   Discussed with ICU team and nurse.  Prognosis remains poor.  Subjective: Seen and examined in ICU.  Patient is sedated and intubated.  Tolerating CRRT well.  Able to come off phenylephrine today, BP still soft.  No net UF.    Objective Vital signs in last 24 hours: Vitals:   08/08/19 0700 08/08/19 0744 08/08/19 0800 08/08/19 0900  BP: 98/63 98/63 100/67 102/72  Pulse: (!) 57 (!) 58 (!) 59 (!) 56  Resp: '16 16 16 16  '$ Temp:   98.1 F (36.7 C)   TempSrc:   Axillary   SpO2: 100% 100% 100% 100%  Weight:      Height:       Weight change: 0.2 kg  Intake/Output Summary (Last 24 hours) at 08/08/2019 0927 Last data filed at 08/08/2019 0900 Gross per 24 hour  Intake 3382.24 ml  Output 3304 ml   Net 78.24 ml       Labs: Basic Metabolic Panel: Recent Labs  Lab 08/07/19 1615 08/08/19 0303 08/08/19 0304  NA 137 137 138  K 4.0 3.9 3.9  CL 102 101 102  CO2 '25 23 24  '$ GLUCOSE 159* 173* 171*  BUN '14 13 14  '$ CREATININE 1.28* 1.19* 1.16*  CALCIUM 7.4* 7.3* 7.4*  PHOS 2.5  2.5 3.1 3.2   Liver Function Tests: Recent Labs  Lab 08/06/19 0240  08/07/19 0248 08/07/19 1615 08/08/19 0303 08/08/19 0304  AST 1,452*  --  1,612*  --  1,158*  --   ALT 227*  --  282*  --  298*  --   ALKPHOS 274*  --  259*  --  314*  --   BILITOT 33.1*  --  34.8*  --  40.0*  --   PROT 5.9*  --  5.5*  --  5.8*  --   ALBUMIN 2.9*  2.8*   < > 2.6*  2.6* 2.5* 2.6* 2.5*   < > = values in this interval not displayed.   Recent Labs  Lab 08/18/2019 2011 08/05/19 2203  LIPASE 179* 104*   Recent Labs  Lab 09/01/2019 2011 08/07/19 0248  AMMONIA 241* 85*   CBC: Recent Labs  Lab 08/05/19 1010  08/05/19 1513 08/06/19 0240  08/06/19 1133 08/07/19 0248 08/08/19 0303  WBC 53.8*  --  49.1* 45.9*  --   --  44.7* 47.7*  NEUTROABS 50.6*  --  42.5*  --   --   --   --   --   HGB 8.1*   < > 7.3* 7.6*   < > 7.8* 7.1* 7.5*  HCT 23.0*   < > 21.0* 20.9*   < > 23.0* 20.4* 21.7*  MCV 109.0*  --  108.8* 105.6*  --   --  107.9* 108.5*  PLT 145*  --  111* 91*  --   --  69* 51*   < > = values in this interval not displayed.   Cardiac Enzymes: Recent Labs  Lab 08/05/19 1010  CKTOTAL 115   CBG: Recent Labs  Lab 08/07/19 1625 08/07/19 2012 08/07/19 2337 08/08/19 0315 08/08/19 0735  GLUCAP 147* 160* 154* 166* 148*    Iron Studies: No results for input(s): IRON, TIBC, TRANSFERRIN, FERRITIN in the last 72 hours. Studies/Results: US Renal  Result Date: 08/06/2019 CLINICAL DATA:  Acute kidney injury.  Substance abuse. EXAM: RENAL / URINARY TRACT ULTRASOUND COMPLETE COMPARISON:  CT of the abdomen and pelvis on 03/28/2018 FINDINGS: Right Kidney: Renal measurements: 13.0 x 6.7 x 7.3 centimeter = volume:  300.7 mL. Trace amount of perinephric fluid. Thin renal parenchyma. No mass or hydronephrosis. Left Kidney: Renal measurements: 12.3 x 7.0 x 8.0 centimeters = volume: 360.6 mL. Thin renal parenchyma. No mass or hydronephrosis. Bladder: A Foley catheter decompresses the bladder. IMPRESSION: No hydronephrosis or suspicious renal mass. Small amount of RIGHT perinephric fluid. Electronically Signed   By: Nolon Nations M.D.   On: 08/06/2019 13:44   Dg Chest Port 1 View  Result Date: 08/08/2019 CLINICAL DATA:  Acute respiratory failure.  Intubated patient. EXAM: PORTABLE CHEST 1 VIEW COMPARISON:  Single-view of the chest 08/07/2019 and 08/05/2019. FINDINGS: Support tubes and lines are unchanged since yesterday's examination. Lung volumes are low. Small focus of airspace disease is seen in the left lung base. Right lung is clear. Heart size is upper normal. No pneumothorax or pleural fluid. IMPRESSION: No change in left basilar airspace opacity. Support tubes and lines projecting good position. Electronically Signed   By: Inge Rise M.D.   On: 08/08/2019 08:24   Dg Chest Port 1 View  Result Date: 08/07/2019 CLINICAL DATA:  Intubated patient. EXAM: PORTABLE CHEST 1 VIEW COMPARISON:  08/05/2019 FINDINGS: Endotracheal tube is 2.9 cm above the carina. Non tunneled dialysis catheter tip in the lower SVC. Left jugular central line is in the upper SVC region. Heart size is stable. Nasogastric tube extends into the abdomen but the tip is beyond the image. Negative for pneumothorax. Streaky perihilar densities and a few streaky densities in the lower lung. Streaky densities are suggestive for atelectasis. The medial left hemidiaphragm is again obscured. IMPRESSION: 1. Streaky lung densities are suggestive for atelectasis. 2. There may be mild consolidation or volume loss at the medial left lung base. 3. Support apparatuses appear to be adequately positioned. Electronically Signed   By: Markus Daft M.D.   On:  08/07/2019 08:45   Dg Abd Portable 1v  Result Date: 08/06/2019 CLINICAL DATA:  Abdominal distension.  Septic shock. EXAM: PORTABLE ABDOMEN - 1 VIEW COMPARISON:  Radiographs 08/05/2019.  CT 03/28/2018. FINDINGS: 1045 hours. Two views obtained. The nasogastric tube is unchanged in position, with tip in the expected location of the distal stomach. Minimal colonic distention in the mid abdomen is unchanged. There is no bowel wall thickening or supine evidence of free air. IMPRESSION: Stable position of the nasogastric tube. Nonobstructive  bowel gas pattern. Electronically Signed   By: Richardean Sale M.D.   On: 08/06/2019 11:23    Medications: Infusions: .  prismasol BGK 4/2.5 300 mL/hr at 08/07/19 1825  .  prismasol BGK 4/2.5 400 mL/hr at 08/08/19 0603  . sodium chloride 10 mL/hr at 08/08/19 0900  . cefTRIAXone (ROCEPHIN)  IV Stopped (08/07/19 2113)  . feeding supplement (VITAL AF 1.2 CAL) 55 mL/hr at 08/08/19 0700  . fentaNYL infusion INTRAVENOUS 100 mcg/hr (08/08/19 0900)  . norepinephrine (LEVOPHED) Adult infusion 22 mcg/min (08/08/19 0900)  . phenylephrine (NEO-SYNEPHRINE) Adult infusion Stopped (08/08/19 0159)  . prismasol BGK 4/2.5 1,500 mL/hr at 08/08/19 0643  . vasopressin (PITRESSIN) infusion - *FOR SHOCK* 0.04 Units/min (08/08/19 0900)    Scheduled Medications: . B-complex with vitamin C  1 tablet Per Tube Daily  . chlorhexidine gluconate (MEDLINE KIT)  15 mL Mouth Rinse BID  . Chlorhexidine Gluconate Cloth  6 each Topical Daily  . feeding supplement (PRO-STAT SUGAR FREE 64)  30 mL Per Tube BID  . folic acid  1 mg Intravenous Daily  . insulin aspart  0-9 Units Subcutaneous Q4H  . lactulose  20 g Per Tube TID  . mouth rinse  15 mL Mouth Rinse 10 times per day  . methylPREDNISolone (SOLU-MEDROL) injection  60 mg Intravenous Q12H  . pantoprazole (PROTONIX) IV  40 mg Intravenous QHS    have reviewed scheduled and prn medications.  Physical Exam: General: Sedated, intubated,  icteric++ Heart:RRR, s1s2 nl no rubs Lungs: Coarse breath sound bilateral, no wheeze Abdomen:soft,  non-distended Extremities: Trace pedal edema Dialysis Access: Right IJ temporary dialysis catheter placed on 9/3 by PCCM. Skin: Icteric Neuro: Sedated   Victoria Osborne 08/08/2019,9:27 AM  LOS: 4 days  Pager: [phone]

## 2019-08-09 LAB — RENAL FUNCTION PANEL
Albumin: 2.2 g/dL — ABNORMAL LOW (ref 3.5–5.0)
Albumin: 2.2 g/dL — ABNORMAL LOW (ref 3.5–5.0)
Anion gap: 10 (ref 5–15)
Anion gap: 9 (ref 5–15)
BUN: 16 mg/dL (ref 6–20)
BUN: 18 mg/dL (ref 6–20)
CO2: 24 mmol/L (ref 22–32)
CO2: 24 mmol/L (ref 22–32)
Calcium: 7.2 mg/dL — ABNORMAL LOW (ref 8.9–10.3)
Calcium: 7.3 mg/dL — ABNORMAL LOW (ref 8.9–10.3)
Chloride: 105 mmol/L (ref 98–111)
Chloride: 105 mmol/L (ref 98–111)
Creatinine, Ser: 1.03 mg/dL — ABNORMAL HIGH (ref 0.44–1.00)
Creatinine, Ser: 1.06 mg/dL — ABNORMAL HIGH (ref 0.44–1.00)
GFR calc Af Amer: >60 mL/min (ref >60)
GFR calc Af Amer: >60 mL/min (ref >60)
GFR calc non Af Amer: >60 mL/min (ref >60)
GFR calc non Af Amer: 58 mL/min — ABNORMAL LOW (ref >60)
Glucose, Bld: 194 mg/dL — ABNORMAL HIGH (ref 70–99)
Glucose, Bld: 255 mg/dL — ABNORMAL HIGH (ref 70–99)
Phosphorus: 2.2 mg/dL — ABNORMAL LOW (ref 2.5–4.6)
Phosphorus: 2.6 mg/dL (ref 2.5–4.6)
Potassium: 3.8 mmol/L (ref 3.5–5.1)
Potassium: 3.8 mmol/L (ref 3.5–5.1)
Sodium: 139 mmol/L (ref 135–145)
Sodium: 138 mmol/L (ref 135–145)

## 2019-08-09 LAB — GLUCOSE, CAPILLARY
Glucose-Capillary: 181 mg/dL — ABNORMAL HIGH (ref 70–99)
Glucose-Capillary: 206 mg/dL — ABNORMAL HIGH (ref 70–99)
Glucose-Capillary: 206 mg/dL — ABNORMAL HIGH (ref 70–99)
Glucose-Capillary: 216 mg/dL — ABNORMAL HIGH (ref 70–99)
Glucose-Capillary: 220 mg/dL — ABNORMAL HIGH (ref 70–99)
Glucose-Capillary: 237 mg/dL — ABNORMAL HIGH (ref 70–99)
Glucose-Capillary: 239 mg/dL — ABNORMAL HIGH (ref 70–99)

## 2019-08-09 LAB — CBC
HCT: 16.9 % — ABNORMAL LOW (ref 36.0–46.0)
Hemoglobin: 5.6 g/dL — CL (ref 12.0–15.0)
MCH: 37.3 pg — ABNORMAL HIGH (ref 26.0–34.0)
MCHC: 33.1 g/dL (ref 30.0–36.0)
MCV: 112.7 fL — ABNORMAL HIGH (ref 80.0–100.0)
Platelets: 50 10*3/uL — ABNORMAL LOW (ref 150–400)
RBC: 1.5 MIL/uL — ABNORMAL LOW (ref 3.87–5.11)
RDW: 23 % — ABNORMAL HIGH (ref 11.5–15.5)
WBC: 57.6 10*3/uL (ref 4.0–10.5)
nRBC: 2.7 % — ABNORMAL HIGH (ref 0.0–0.2)

## 2019-08-09 LAB — MAGNESIUM: Magnesium: 2.5 mg/dL — ABNORMAL HIGH (ref 1.7–2.4)

## 2019-08-09 LAB — HEMOGLOBIN AND HEMATOCRIT, BLOOD
HCT: 30.4 % — ABNORMAL LOW (ref 36.0–46.0)
Hemoglobin: 10.8 g/dL — ABNORMAL LOW (ref 12.0–15.0)

## 2019-08-09 LAB — HEPATIC FUNCTION PANEL
ALT: 245 U/L — ABNORMAL HIGH (ref 0–44)
AST: 574 U/L — ABNORMAL HIGH (ref 15–41)
Albumin: 2.2 g/dL — ABNORMAL LOW (ref 3.5–5.0)
Alkaline Phosphatase: 296 U/L — ABNORMAL HIGH (ref 38–126)
Bilirubin, Direct: 24.4 mg/dL — ABNORMAL HIGH (ref 0.0–0.2)
Indirect Bilirubin: 14 mg/dL — ABNORMAL HIGH (ref 0.3–0.9)
Total Bilirubin: 38.4 mg/dL (ref 0.3–1.2)
Total Protein: 5.4 g/dL — ABNORMAL LOW (ref 6.5–8.1)

## 2019-08-09 LAB — APTT: aPTT: 53 seconds — ABNORMAL HIGH (ref 24–36)

## 2019-08-09 LAB — PREPARE RBC (CROSSMATCH)

## 2019-08-09 LAB — PROTIME-INR
INR: 1.8 — ABNORMAL HIGH (ref 0.8–1.2)
Prothrombin Time: 21 seconds — ABNORMAL HIGH (ref 11.4–15.2)

## 2019-08-09 MED ORDER — CHLORHEXIDINE GLUCONATE 0.12 % MT SOLN
OROMUCOSAL | Status: AC
  Filled 2019-08-09: qty 15

## 2019-08-09 MED ORDER — VITAMIN B-1 100 MG PO TABS
100.0000 mg | ORAL_TABLET | Freq: Every day | ORAL | Status: DC
  Administered 2019-08-09 – 2019-08-10 (×2): 100 mg
  Filled 2019-08-09 (×2): qty 1

## 2019-08-09 MED ORDER — SODIUM PHOSPHATES 45 MMOLE/15ML IV SOLN
15.0000 mmol | Freq: Once | INTRAVENOUS | Status: AC
  Administered 2019-08-09: 15 mmol via INTRAVENOUS
  Filled 2019-08-09: qty 5

## 2019-08-09 MED ORDER — SODIUM CHLORIDE 0.9% IV SOLUTION
Freq: Once | INTRAVENOUS | Status: AC
  Administered 2019-08-09: 07:00:00 via INTRAVENOUS

## 2019-08-09 MED ORDER — PANTOPRAZOLE SODIUM 40 MG PO PACK
40.0000 mg | PACK | Freq: Every day | ORAL | Status: DC
  Administered 2019-08-09: 21:00:00 40 mg
  Filled 2019-08-09: qty 20

## 2019-08-09 MED ORDER — FOLIC ACID 1 MG PO TABS
1.0000 mg | ORAL_TABLET | Freq: Every day | ORAL | Status: DC
  Administered 2019-08-10: 09:00:00 1 mg
  Filled 2019-08-09 (×2): qty 1

## 2019-08-09 NOTE — Social Work (Signed)
CSW acknowledging consult from 9/2 to "obtain advanced directive and primary contact/POA information." At current time pt does not have advanced directives on file, is unable to complete at current time. Has multiple contacts on facesheet including:  Son Khamaria Henes [phone] Sister Leticia Penna [phone] Mother Mayer Camel [phone]  It appears care team has been in contact with family for decision making. CSW signing off. Please consult if any additional needs arise.  Alexander Mt, [place] Work [phone]

## 2019-08-09 NOTE — Progress Notes (Signed)
NAME:  Victoria Osborne, MRN:  [id number], DOB:  01/06/1961, LOS: 5 ADMISSION DATE:  08/18/2019, CONSULTATION DATE:  9/2 REFERRING MD:  ER, CHIEF COMPLAINT:  AMS    Brief History   58 y/o F admitted 9/2 with reports of altered mental status.  The patient was found at home lying on the floor unresponsive with 9 empty bottles of vodka.  He had noted visible jaundice the week preceding admit.  Work up notable for GNR UTI, bacteremia (klebsiella on BCID).  Requiring mechanical ventilation, vasopressors and CVVHD.   Past Medical History  Anxiety  Depression  ETOH Abuse  Significant Hospital Events   9/02 Admit  9/03 Arrived to ICU obtunded, tachypneic > intubated 9/05 CVVHD, fentanyl gtt (63mcg), levophed 26 mcg, neosynephrine, vaso gtt's 9/06 Levo down to 22, vaso, fent increased to 200 mcg  Consults:  PCCM   Procedures:  ETT 9/3 >>  Central line 9/3 >>  Aline 9/3 >>   Significant Diagnostic Tests:    Micro Data:  COVID 9/2 >> negative  BCID 9/2 >> klebsiella  BCx2 9/2 >> Klebsiella pneumoniae >> S-ceftriaxone UC 9/3 >> Klebsiella pneumoniae >> S-ceftriaxone  >> E-Coli >> S-ceftriaxone  Antimicrobials:  Vanco 9/2 >> 9/4 Cefepime 9/2 >> 9/4  Ceftriaxone 9/4 >>   Interim history/subjective:  RN reports episode of bleeding overnight with IV removal.  Remains on levophed at 20 mcg, vasopressin.  CVVHD continues.  1 unit of blood infusing for Hgb of 5.6.    Objective   Blood pressure (!) 110/54, pulse 65, temperature 97.6 F (36.4 C), temperature source Oral, resp. rate 16, height 5\' 7"  (1.702 m), weight 80 kg, SpO2 100 %.    Vent Mode: PRVC FiO2 (%):  [40 %] 40 % Set Rate:  [16 bmp] 16 bmp Vt Set:  [490 mL] 490 mL PEEP:  [5 cmH20] 5 cmH20 Plateau Pressure:  [15 [id number] cmH20] 21 cmH20   Intake/Output Summary (Last 24 hours) at 08/09/2019 0806 Last data filed at 08/09/2019 0700 Gross per 24 hour  Intake 2964.35 ml  Output 2863 ml  Net 101.35 ml   Filed Weights   08/07/19 0210 08/08/19 0320 08/09/19 0500  Weight: 79.9 kg 80.1 kg 80 kg    Examination: General: critically ill appearing female lying bed in NAD, jaundice HEENT: MM pink/moist, ETT, scleral icterus Neuro: sedate, flickers eyes to voice, no follow commands  CV: s1s2 rrr, no m/r/g PULM:  Even/non-labored on vent, lungs bilaterally clear  GI: soft, bsx4 active  Extremities: warm/dry, 1-2+ generalized edema  Skin: no rashes or lesions  Resolved Hospital Problem list      Assessment & Plan:   Alcoholic Hepatitis  Shock Liver  -GI signed off 9/5, following PRN P: Trend coag's, LFT's Solumedrol IV Avoid hepatotoxic agents   Abdominal Tenderness -non-obs bowel gas pattern on KUB 9/4 Severe Malnutrition P: PPI  TF per Nutrition  Septic Shock secondary to Klebsiella + E-Coli UTI, Klebsiella Bacteremia   Leukocytosis  P: Trend CVP  Vasopressors for MAP >65 Rocephin for bacteremia LCB  Acute Hypoxic Respiratory Failure  P: PRVC 8cc/kg  Wean PEEP / fiO2 for sats >90% PSV wean when clinical status permits   AKI Mixed AG / NGMA -inability to clear lactate due to hepatic failure  P: Appreciate Nephrology assistance with patient care Trend BMP / urinary output Replace electrolytes as indicated Avoid nephrotoxic agents, ensure adequate renal perfusion  Acute Metabolic Encephalopathy  -in setting of hepatic failure  P: Lactulose 20 mg  TID  Follow serial neuro exams  PT efforts as able   Coagulopathy  -in setting of liver dysfunction Thrombocytopenia  P: Trend CBC  Monitor for bleeding   ETOH Abuse  P: Thiamine, folate, MVI    Best practice:  Diet: TF  Pain/Anxiety/Delirium protocol (if indicated): In place  VAP protocol (if indicated): In place  DVT prophylaxis: SCD's in setting of coagulopathy  GI prophylaxis: PPI  Glucose control: SSI, sensitive scale Mobility: Bed rest  Code Status: LCB  Family Communication: Son Merry Proud) called for update 9/7.   Reviewed patients lack of progress and nature of critical illness. He indicates an understanding of how sick the patient is and that she has not shown significant improvement.  He would like to speak with Dr. Elsworth Soho as well today in terms of goals of care review / prognostication.  Disposition: ICU   Labs   CBC: Recent Labs  Lab 08/05/19 1010  08/05/19 1513 08/06/19 0240 08/06/19 [id number] 08/06/19 1133 08/07/19 0248 08/08/19 0303 08/09/19 0232  WBC 53.8*  --  49.1* 45.9*  --   --  44.7* 47.7* 57.6*  NEUTROABS 50.6*  --  42.5*  --   --   --   --   --   --   HGB 8.1*   < > 7.3* 7.6* 8.5* 7.8* 7.1* 7.5* 5.6*  HCT 23.0*   < > 21.0* 20.9* 25.0* 23.0* 20.4* 21.7* 16.9*  MCV 109.0*  --  108.8* 105.6*  --   --  107.9* 108.5* 112.7*  PLT 145*  --  111* 91*  --   --  69* 51* 50*   < > = values in this interval not displayed.    Basic Metabolic Panel: Recent Labs  Lab 08/06/19 1730 08/07/19 0248 08/07/19 1615 08/08/19 0303 08/08/19 0304 08/08/19 1614 08/09/19 0232  NA 139 139 137 137 138 138 139  K 3.9 3.7 4.0 3.9 3.9 3.8 3.8  CL 100 101 102 101 102 104 105  CO2 22 24 25 23 24 23 24   GLUCOSE 146* 198* 159* 173* 171* 231* 194*  BUN 22* 17 14 13 14 16 16   CREATININE 2.01* 1.50* 1.28* 1.19* 1.16* 1.13* 1.03*  CALCIUM 7.2* 7.0* 7.4* 7.3* 7.4* 7.3* 7.2*  MG 2.0 2.3 2.3 2.4  --   --  2.5*  PHOS 2.2* 2.6 2.5  2.5 3.1 3.2 2.6 2.2*   GFR: Estimated Creatinine Clearance: 65.6 mL/min (A) (by C-G formula based on SCr of 1.03 mg/dL (H)). Recent Labs  Lab 08/05/19 0441  08/05/19 1513 08/06/19 0240 08/06/19 0948 08/06/19 2342 08/07/19 0248 08/08/19 0303 08/09/19 0232  PROCALCITON  --   --   --   --   --   --  3.71 2.88  --   WBC  --    < > 49.1* 45.9*  --   --  44.7* 47.7* 57.6*  LATICACIDVEN >11.0*  --  10.9*  --  9.4* 4.8*  --   --   --    < > = values in this interval not displayed.    Liver Function Tests: Recent Labs  Lab 08/05/19 1513  08/06/19 0240  08/07/19 0248 08/07/19  1615 08/08/19 0303 08/08/19 0304 08/08/19 1614 08/09/19 0232  AST 1,353*  --  1,452*  --  1,612*  --  1,158*  --   --  574*  ALT 181*  --  227*  --  282*  --  298*  --   --  245*  ALKPHOS 262*  --  274*  --  259*  --  314*  --   --  296*  BILITOT 27.5*  --  33.1*  --  34.8*  --  40.0*  --   --  38.4*  PROT 4.9*  --  5.9*  --  5.5*  --  5.8*  --   --  5.4*  ALBUMIN 2.0*   < > 2.9*  2.8*   < > 2.6*  2.6* 2.5* 2.6* 2.5* 2.3* 2.2*  2.2*   < > = values in this interval not displayed.   Recent Labs  Lab 08/28/2019 2011 08/05/19 2203  LIPASE 179* 104*   Recent Labs  Lab 08/08/2019 2011 08/07/19 0248  AMMONIA 241* 85*    ABG    Component Value Date/Time   PHART 7.403 08/06/2019 1133   PCO2ART 30.1 (L) 08/06/2019 1133   PO2ART 124.0 (H) 08/06/2019 1133   HCO3 18.9 (L) 08/06/2019 1133   TCO2 20 (L) 08/06/2019 1133   ACIDBASEDEF 5.0 (H) 08/06/2019 1133   O2SAT 99.0 08/06/2019 1133     Coagulation Profile: Recent Labs  Lab 08/05/19 1010 08/06/19 0240 08/07/19 0248 08/08/19 0303 08/09/19 0232  INR 3.0* 4.1* 3.3* 2.2* 1.8*    Cardiac Enzymes: Recent Labs  Lab 08/05/19 1010  CKTOTAL 115    HbA1C: Hgb A1c MFr Bld  Date/Time Value Ref Range Status  08/06/2019 11:05 PM 4.7 (L) 4.8 - 5.6 % Final    Comment:    (NOTE) Pre diabetes:          5.7%-6.4% Diabetes:              >6.4% Glycemic control for   <7.0% adults with diabetes   02/27/2016 09:30 AM 5.3 <5.7 % Final    Comment:      For the purpose of screening for the presence of diabetes:   <5.7%       Consistent with the absence of diabetes 5.7-6.4 %   Consistent with increased risk for diabetes (prediabetes) >=6.5 %     Consistent with diabetes   This assay result is consistent with a decreased risk of diabetes.   Currently, no consensus exists regarding use of hemoglobin A1c for diagnosis of diabetes in children.   According to American Diabetes Association (ADA) guidelines, hemoglobin A1c <7.0%  represents optimal control in non-pregnant diabetic patients. Different metrics may apply to specific patient populations. Standards of Medical Care in Diabetes (ADA).       CBG: Recent Labs  Lab 08/08/19 1625 08/08/19 2049 08/09/19 0009 08/09/19 0306 08/09/19 0735  GLUCAP 210* 199* 220* 181* 206*      Critical care time: 40 minutes     Noe Gens, NP-C [facility] Pgr: [phone] or if no answer [phone] 08/09/2019, 8:06 AM

## 2019-08-09 NOTE — Progress Notes (Signed)
[place] Progress Note Patient Name: Victoria Osborne DOB: 1961-04-15 MRN: [id number]   Date of Service  08/09/2019  HPI/Events of Note  Bedside nurse states that son now wants ACLS drugs.   eICU Interventions  Will amend partial code status to reflect that desire.      Intervention Category Major Interventions: End of life / care limitation discussion  Lysle Dingwall 08/09/2019, 10:47 PM

## 2019-08-09 NOTE — Progress Notes (Signed)
CRITICAL VALUE ALERT  Critical Value:  Hgb and WBC   Date & Time Notied: 08/09/19 05:25  Provider Notified: E-Link MD   Orders Received/Actions taken: Waiting for orders

## 2019-08-09 NOTE — Progress Notes (Signed)
Patient's liver enzymes and INR are substantially improved, although still significantly elevated.  Bilirubin is slightly lower than yesterday; too early to tell if it has "crested" and we are seeing a downward trend, or this is just laboratory variation.  Transfusion was needed today, probably because of blood loss at an IV site which bled profusely after removal.  The patient has a Flexi-Seal in while receiving lactulose, and the stool is liquid and brown in color; there is no evidence of GI tract bleeding at present.  Impression: Severe alcoholic hepatitis with some evidence of improvement  Recommendation: Continue current management.  Still not much to do from liver standpoint.  Talked extensively with patient's son, Merry Proud, who is at the bedside.  I explained that, although the improvement in the liver numbers is somewhat encouraging, the overall picture remains quite bleak and, even if his mother survives the hospitalization, there could be residual medical problems and certainly the risk of recidivism with alcohol.  Therefore, I still think her prognosis is grave.  We will continue to follow at a distance.  Please call us if earlier input is desired at any time.  Cleotis Nipper, M.D. Pager [phone] If no answer or after 5 PM call [phone]

## 2019-08-09 NOTE — Procedures (Addendum)
Admit: 08/20/2019 LOS: 33  58 year old F chronic alcohol abuse, alcoholic hepatitis, dialysis dependent AKI, Klebsiella bacteremia and UTI, VDRF  Current CRRT Prescription: Start Date: 08/07/19 Catheter: R IJ Temp HD cath placed 9/3 CCM BFR: 300 Pre Blood Pump: 300 4K DFR: 1500 4K Replacement Rate: 400 4K Goal UF: Net even Anticoagulation: none Clotting: infrequent  S: Remains on NE and VP K 3.8, P 2.2  O: 09/06 0701 - 09/07 0700 In: 3084.1 [I.V.:1514.1; NG/GT:1470; IV Piggyback:100] Out: 2981 [Urine:20; Stool:300]  Filed Weights   08/07/19 0210 08/08/19 0320 08/09/19 0500  Weight: 79.9 kg 80.1 kg 80 kg    Recent Labs  Lab 08/08/19 0304 08/08/19 1614 08/09/19 0232  NA 138 138 139  K 3.9 3.8 3.8  CL 102 104 105  CO2 '24 23 24  '$ GLUCOSE 171* 231* 194*  BUN '14 16 16  '$ CREATININE 1.16* 1.13* 1.03*  CALCIUM 7.4* 7.3* 7.2*  PHOS 3.2 2.6 2.2*   Recent Labs  Lab 08/05/19 1010  08/05/19 1513  08/07/19 0248 08/08/19 0303 08/09/19 0232  WBC 53.8*  --  49.1*   < > 44.7* 47.7* 57.6*  NEUTROABS 50.6*  --  42.5*  --   --   --   --   HGB 8.1*   < > 7.3*   < > 7.1* 7.5* 5.6*  HCT 23.0*   < > 21.0*   < > 20.4* 21.7* 16.9*  MCV 109.0*  --  108.8*   < > 107.9* 108.5* 112.7*  PLT 145*  --  111*   < > 69* 51* 50*   < > = values in this interval not displayed.    Scheduled Meds: . B-complex with vitamin C  1 tablet Per Tube Daily  . chlorhexidine gluconate (MEDLINE KIT)  15 mL Mouth Rinse BID  . Chlorhexidine Gluconate Cloth  6 each Topical Daily  . feeding supplement (PRO-STAT SUGAR FREE 64)  30 mL Per Tube BID  . folic acid  1 mg Intravenous Daily  . insulin aspart  0-9 Units Subcutaneous Q4H  . lactulose  20 g Per Tube TID  . mouth rinse  15 mL Mouth Rinse 10 times per day  . methylPREDNISolone (SOLU-MEDROL) injection  60 mg Intravenous Q12H  . pantoprazole (PROTONIX) IV  40 mg Intravenous QHS   Continuous Infusions: .  prismasol BGK 4/2.5 300 mL/hr at 08/09/19 0457  .   prismasol BGK 4/2.5 400 mL/hr at 08/09/19 0841  . sodium chloride 10 mL/hr at 08/09/19 0900  . cefTRIAXone (ROCEPHIN)  IV Stopped (08/08/19 2054)  . feeding supplement (VITAL AF 1.2 CAL) 55 mL/hr at 08/09/19 0700  . fentaNYL infusion INTRAVENOUS 175 mcg/hr (08/09/19 0900)  . norepinephrine (LEVOPHED) Adult infusion 20 mcg/min (08/09/19 8889)  . prismasol BGK 4/2.5 1,500 mL/hr at 08/09/19 0646  . vasopressin (PITRESSIN) infusion - *FOR SHOCK* 0.04 Units/min (08/09/19 0900)   PRN Meds:.fentaNYL (SUBLIMAZE) injection, heparin, midazolam  ABG    Component Value Date/Time   PHART 7.403 08/06/2019 1133   PCO2ART 30.1 (L) 08/06/2019 1133   PO2ART 124.0 (H) 08/06/2019 1133   HCO3 18.9 (L) 08/06/2019 1133   TCO2 20 (L) 08/06/2019 1133   ACIDBASEDEF 5.0 (H) 08/06/2019 1133   O2SAT 99.0 08/06/2019 1133    A/P  1. Dialysis dependent anuric AKI on CRRT, stable 2. Acute alcoholic hepatitis, severe, on steroids 3. Shock, septic: Requiring vasopressors, Per CCM 4. Metabolic acidosis, improved on CRRT 5. AMS 6. VDRF 7. Anemia, Hb 5.6 this AM, transfused  by CCM 8. Leukocytosis, on steroids 9. Klebsiella bacteremia+UTI, E coli UTI, ceftriaxone  Continue CRRT at current settings, net even UF, will continue to follow closely.  Pearson Grippe, MD [facility]

## 2019-08-09 NOTE — Progress Notes (Signed)
[place] Progress Note Patient Name: Victoria Osborne DOB: [birth year] MRN: [id number]   Date of Service  08/09/2019  HPI/Events of Note  Multiple issues: 1. Leukocytosis - WBC = 57.6, however, WBC was 47.7 yesterday and she is on Rocephin for GN bacteremia and Hgb = 5.6. Patient bleed profusely from old  IV site, however, bleeding now stopped according to bedside nurse.   eICU Interventions  Will order: 1. Will transfuse 2 units PRBC.      Intervention Category Major Interventions: Other:  Lysle Dingwall 08/09/2019, 5:33 AM

## 2019-08-10 ENCOUNTER — Inpatient Hospital Stay (HOSPITAL_COMMUNITY): Payer: BLUE CROSS/BLUE SHIELD

## 2019-08-10 DIAGNOSIS — R7881 Bacteremia: Secondary | ICD-10-CM

## 2019-08-10 DIAGNOSIS — E440 Moderate protein-calorie malnutrition: Secondary | ICD-10-CM | POA: Insufficient documentation

## 2019-08-10 LAB — BASIC METABOLIC PANEL
Anion gap: 9 (ref 5–15)
BUN: 18 mg/dL (ref 6–20)
CO2: 24 mmol/L (ref 22–32)
Calcium: 7.4 mg/dL — ABNORMAL LOW (ref 8.9–10.3)
Chloride: 104 mmol/L (ref 98–111)
Creatinine, Ser: 0.97 mg/dL (ref 0.44–1.00)
GFR calc Af Amer: >60 mL/min (ref >60)
GFR calc non Af Amer: >60 mL/min (ref >60)
Glucose, Bld: 222 mg/dL — ABNORMAL HIGH (ref 70–99)
Potassium: 4 mmol/L (ref 3.5–5.1)
Sodium: 137 mmol/L (ref 135–145)

## 2019-08-10 LAB — TYPE AND SCREEN
ABO/RH(D): B POS
Antibody Screen: NEGATIVE
Unit division: 0
Unit division: 0

## 2019-08-10 LAB — PHOSPHORUS: Phosphorus: 3 mg/dL (ref 2.5–4.6)

## 2019-08-10 LAB — RENAL FUNCTION PANEL
Albumin: 2.2 g/dL — ABNORMAL LOW (ref 3.5–5.0)
Albumin: 2 g/dL — ABNORMAL LOW (ref 3.5–5.0)
Anion gap: 9 (ref 5–15)
Anion gap: 11 (ref 5–15)
BUN: 19 mg/dL (ref 6–20)
BUN: 20 mg/dL (ref 6–20)
CO2: 23 mmol/L (ref 22–32)
CO2: 21 mmol/L — ABNORMAL LOW (ref 22–32)
Calcium: 7.4 mg/dL — ABNORMAL LOW (ref 8.9–10.3)
Calcium: 7.2 mg/dL — ABNORMAL LOW (ref 8.9–10.3)
Chloride: 104 mmol/L (ref 98–111)
Chloride: 102 mmol/L (ref 98–111)
Creatinine, Ser: 1.01 mg/dL — ABNORMAL HIGH (ref 0.44–1.00)
Creatinine, Ser: 0.92 mg/dL (ref 0.44–1.00)
GFR calc Af Amer: >60 mL/min (ref >60)
GFR calc Af Amer: >60 mL/min (ref >60)
GFR calc non Af Amer: >60 mL/min (ref >60)
GFR calc non Af Amer: >60 mL/min (ref >60)
Glucose, Bld: 220 mg/dL — ABNORMAL HIGH (ref 70–99)
Glucose, Bld: 224 mg/dL — ABNORMAL HIGH (ref 70–99)
Phosphorus: 2.9 mg/dL (ref 2.5–4.6)
Phosphorus: 2.5 mg/dL (ref 2.5–4.6)
Potassium: 3.9 mmol/L (ref 3.5–5.1)
Potassium: 3.6 mmol/L (ref 3.5–5.1)
Sodium: 136 mmol/L (ref 135–145)
Sodium: 134 mmol/L — ABNORMAL LOW (ref 135–145)

## 2019-08-10 LAB — BPAM RBC
Blood Product Expiration Date: 202009222359
Blood Product Expiration Date: 202009272359
ISSUE DATE / TIME: 202009070651
ISSUE DATE / TIME: 202009070913
Unit Type and Rh: 7300
Unit Type and Rh: 7300

## 2019-08-10 LAB — HEPATIC FUNCTION PANEL
ALT: 213 U/L — ABNORMAL HIGH (ref 0–44)
ALT: 195 U/L — ABNORMAL HIGH (ref 0–44)
AST: 340 U/L — ABNORMAL HIGH (ref 15–41)
AST: 282 U/L — ABNORMAL HIGH (ref 15–41)
Albumin: 2.3 g/dL — ABNORMAL LOW (ref 3.5–5.0)
Albumin: 2.2 g/dL — ABNORMAL LOW (ref 3.5–5.0)
Alkaline Phosphatase: 322 U/L — ABNORMAL HIGH (ref 38–126)
Alkaline Phosphatase: 321 U/L — ABNORMAL HIGH (ref 38–126)
Bilirubin, Direct: 29.6 mg/dL — ABNORMAL HIGH (ref 0.0–0.2)
Bilirubin, Direct: 22 mg/dL — ABNORMAL HIGH (ref 0.0–0.2)
Indirect Bilirubin: 12.2 mg/dL — ABNORMAL HIGH (ref 0.3–0.9)
Indirect Bilirubin: 12.6 mg/dL — ABNORMAL HIGH (ref 0.3–0.9)
Total Bilirubin: 41.8 mg/dL (ref 0.3–1.2)
Total Bilirubin: 34.6 mg/dL (ref 0.3–1.2)
Total Protein: 5.6 g/dL — ABNORMAL LOW (ref 6.5–8.1)
Total Protein: 5.8 g/dL — ABNORMAL LOW (ref 6.5–8.1)

## 2019-08-10 LAB — ECHOCARDIOGRAM COMPLETE
Height: 67 in
Weight: 2860.69 oz

## 2019-08-10 LAB — CBC
HCT: 31.6 % — ABNORMAL LOW (ref 36.0–46.0)
Hemoglobin: 11.2 g/dL — ABNORMAL LOW (ref 12.0–15.0)
MCH: 35.2 pg — ABNORMAL HIGH (ref 26.0–34.0)
MCHC: 35.4 g/dL (ref 30.0–36.0)
MCV: 99.4 fL (ref 80.0–100.0)
Platelets: 57 10*3/uL — ABNORMAL LOW (ref 150–400)
RBC: 3.18 MIL/uL — ABNORMAL LOW (ref 3.87–5.11)
RDW: 24.6 % — ABNORMAL HIGH (ref 11.5–15.5)
WBC: 43.9 10*3/uL — ABNORMAL HIGH (ref 4.0–10.5)
nRBC: 6.6 % — ABNORMAL HIGH (ref 0.0–0.2)

## 2019-08-10 LAB — MAGNESIUM: Magnesium: 2.5 mg/dL — ABNORMAL HIGH (ref 1.7–2.4)

## 2019-08-10 LAB — GLUCOSE, CAPILLARY
Glucose-Capillary: 144 mg/dL — ABNORMAL HIGH (ref 70–99)
Glucose-Capillary: 174 mg/dL — ABNORMAL HIGH (ref 70–99)
Glucose-Capillary: 174 mg/dL — ABNORMAL HIGH (ref 70–99)
Glucose-Capillary: 203 mg/dL — ABNORMAL HIGH (ref 70–99)
Glucose-Capillary: 204 mg/dL — ABNORMAL HIGH (ref 70–99)
Glucose-Capillary: 214 mg/dL — ABNORMAL HIGH (ref 70–99)
Glucose-Capillary: 223 mg/dL — ABNORMAL HIGH (ref 70–99)

## 2019-08-10 LAB — PROTIME-INR
INR: 1.7 — ABNORMAL HIGH (ref 0.8–1.2)
Prothrombin Time: 19.8 seconds — ABNORMAL HIGH (ref 11.4–15.2)

## 2019-08-10 LAB — FIBRINOGEN: Fibrinogen: 191 mg/dL — ABNORMAL LOW (ref 210–475)

## 2019-08-10 MED ORDER — VITAL AF 1.2 CAL PO LIQD
1000.0000 mL | ORAL | Status: DC
  Administered 2019-08-10 – 2019-08-12 (×3): 1000 mL

## 2019-08-10 MED ORDER — FOLIC ACID 5 MG/ML IJ SOLN
1.0000 mg | Freq: Every day | INTRAMUSCULAR | Status: DC
  Filled 2019-08-10: qty 0.2

## 2019-08-10 MED ORDER — FENTANYL CITRATE (PF) 100 MCG/2ML IJ SOLN
25.0000 ug | INTRAMUSCULAR | Status: DC | PRN
  Administered 2019-08-10: 50 ug via INTRAVENOUS
  Administered 2019-08-10: 17:00:00 100 ug via INTRAVENOUS
  Filled 2019-08-10 (×2): qty 2

## 2019-08-10 MED ORDER — THIAMINE HCL 100 MG/ML IJ SOLN: 100.0000 mg | INTRAMUSCULAR | Status: DC

## 2019-08-10 MED ORDER — INSULIN ASPART 100 UNIT/ML ~~LOC~~ SOLN
2.0000 [IU] | SUBCUTANEOUS | Status: DC
  Administered 2019-08-10 – 2019-08-14 (×27): 2 [IU] via SUBCUTANEOUS

## 2019-08-10 MED ORDER — FENTANYL CITRATE (PF) 100 MCG/2ML IJ SOLN: 25.0000 ug | INTRAMUSCULAR | Status: DC | PRN

## 2019-08-10 MED ORDER — LACTULOSE ENEMA
300.0000 mL | Freq: Three times a day (TID) | ORAL | Status: DC
  Administered 2019-08-10 – 2019-08-11 (×2): 300 mL via RECTAL
  Filled 2019-08-10 (×3): qty 300

## 2019-08-10 MED ORDER — METHYLPREDNISOLONE SODIUM SUCC 40 MG IJ SOLR: 40.0000 mg | INTRAMUSCULAR | Status: DC

## 2019-08-10 MED ORDER — PANTOPRAZOLE SODIUM 40 MG IV SOLR
40.0000 mg | INTRAVENOUS | Status: DC
  Administered 2019-08-10 – 2019-08-14 (×5): 40 mg via INTRAVENOUS
  Filled 2019-08-10 (×5): qty 40

## 2019-08-10 MED ORDER — METHYLPREDNISOLONE SODIUM SUCC 40 MG IJ SOLR: 40.0000 mg | Freq: Two times a day (BID) | INTRAMUSCULAR | Status: DC

## 2019-08-10 MED ORDER — IPRATROPIUM-ALBUTEROL 0.5-2.5 (3) MG/3ML IN SOLN
3.0000 mL | Freq: Four times a day (QID) | RESPIRATORY_TRACT | Status: DC
  Administered 2019-08-10 – 2019-08-12 (×8): 3 mL via RESPIRATORY_TRACT
  Filled 2019-08-10 (×8): qty 3

## 2019-08-10 MED ORDER — RIFAXIMIN 550 MG PO TABS
550.0000 mg | ORAL_TABLET | Freq: Two times a day (BID) | ORAL | Status: DC
  Administered 2019-08-10: 11:00:00 550 mg via ORAL
  Filled 2019-08-10: qty 1

## 2019-08-10 NOTE — Procedures (Signed)
Extubation Procedure Note  Patient Details:   Name: Victoria Osborne DOB: September 07, 1961 MRN: [id number]   Airway Documentation:    Vent end date: 08/10/19 Vent end time: 1837   Evaluation  O2 sats: stable throughout Complications: No apparent complications Patient did tolerate procedure well. Bilateral Breath Sounds: Clear   No, pt was not able to speak due to LOC.  Pt extubated to 4 l/m [place] per order by Dr. Charlsie Quest.  Earney Navy 08/10/2019, 6:40 PM

## 2019-08-10 NOTE — Progress Notes (Addendum)
NAME:  Victoria Osborne, MRN:  [id number], DOB:  [birth year], LOS: 79 ADMISSION DATE:  08/25/2019, CONSULTATION DATE:  9/2 REFERRING MD:  ER, CHIEF COMPLAINT:  AMS    Brief History   58 y/o F admitted 9/2 with reports of altered mental status.  The patient was found at home lying on the floor unresponsive with 9 empty bottles of vodka.  He had noted visible jaundice the week preceding admit.  Work up notable for GNR UTI, bacteremia (klebsiella on BCID).  Requiring mechanical ventilation, vasopressors and CVVHD.   Past Medical History  Anxiety  Depression  ETOH Abuse  Significant Hospital Events   9/02 Admit  9/03 Arrived to ICU obtunded, tachypneic > intubated 9/05 CVVHD, fentanyl gtt (25mcg), levophed 26 mcg, neosynephrine, vaso gtt's 9/06 Levo down to 22, vaso, fent increased to 200 mcg 9/8 weaning sedation, continuing CRRT. Interval increase in RLL opacity. Interval decrease in WBC, transaminases   Consults:  PCCM   Procedures:  ETT 9/3 >>  Central line 9/3 >>  Aline 9/3 >>   Significant Diagnostic Tests:   Abd [place] 9/2> hepatic steatosis, cholelithiasis with associated gallbladder wall thickening.  Renal US 9/4> no hydronephrosis or renal mass CXR 9/8> Increased RLL opacity  Micro Data:  COVID 9/2 >> negative  BCID 9/2 >> klebsiella  BCx2 9/2 >> Klebsiella pneumoniae >> S-ceftriaxone UC 9/3 >> Klebsiella pneumoniae >> S-ceftriaxone  >> E-Coli >> S-ceftriaxone Tracheal aspirate 9/8>> Antimicrobials:  Vanco 9/2 >> 9/4 Cefepime 9/2 >> 9/4  Ceftriaxone 9/4 >>   Interim history/subjective:  On Vaso, NE Remains on CRRT  Objective   Blood pressure (!) 88/63, pulse (!) 51, temperature (!) 97.5 F (36.4 C), temperature source Oral, resp. rate 16, height 5\' 7"  (1.702 m), weight 81.1 kg, SpO2 100 %.    Vent Mode: PRVC FiO2 (%):  [40 %] 40 % Set Rate:  [16 bmp] 16 bmp Vt Set:  [490 mL] 490 mL PEEP:  [5 cmH20] 5 cmH20 Plateau Pressure:  [18 cmH20-22 cmH20] 19 cmH20    Intake/Output Summary (Last 24 hours) at 08/10/2019 0843 Last data filed at 08/10/2019 0800 Gross per 24 hour  Intake 3858.29 ml  Output 5048 ml  Net -1189.71 ml   Filed Weights   08/08/19 0320 08/09/19 0500 08/10/19 0323  Weight: 80.1 kg 80 kg 81.1 kg    Examination: General: Critically and chronically ill appearing adult F, intubated and sedated, NAD.  HEENT: NCAT. Pink mmm, ETT OGT secure. Icteric sclera  Neuro: Sedated, opens eyes to tactile stimulation, is not following commands  CV: Bradycardic rate, regular rhythm. s1s2 no rgm. Cap refill < 3 seconds.  PULM:  RML RLL rhonchi. Symmetrical chest expansion. No accessory use.  GI: Soft, round, mild distension. + bowel sounds x4  Extremities: Anasarca. Symmetrical muscle bulk and tone. No obvious joint deformity.  Skin: Jaundice. Scattered petechiae chest, abdomen. Scattered ecchymosis   Resolved Hospital Problem list      Assessment & Plan:   Acute Hypoxic Respiratory Failure  P: PRVC 8cc/kg  Wean PEEP / fiO2 for sats >90% Wean sedation, SBT as clinical status permits  AM CXR Pulm Hygiene Tracheal aspirate as below  Septic Shock secondary to Klebsiella + E-Coli UTI, Klebsiella Bacteremia   Leukocytosis  RLL opacity  P: Trend CVP  Vasopressors for MAP >65 Continue rocephin for bacteremia and UTI Follow up tracheal aspirate   Acute Metabolic Encephalopathy  -in setting of hepatic failure, CNS depressing medications, septic shock  P: Lactulose 20  mg TID, check AM ammonia Follow serial neuro exams  PT efforts as able  RN delirium precautions bundle  Minimize sedation   AKI Mixed AG / NGMA -inability to clear lactate due to hepatic failure  P: CRRT per pharmacy  Trend renal function panel Replace electrolytes as indicated Avoid nephrotoxic agents, ensure adequate renal perfusion  Alcoholic hepatitis Shock Liver-- transaminitis improving  -GI signed off 9/5, following PRN P: Continue to trend coags, LFTs,  check ammonia in AM  Wean solumedrol IV Avoid hepatotoxic agents   Coagulopathy   -in setting of liver dysfunction Thrombocytopenia P: Trend CBC  Monitor for bleeding  Check fibrinogen  SCDs no chemical VTE ppx   Malnutrition, severe EtOH abuse P: PPI  TF per Nutrition Multivitamins, B vitamins    Best practice:  Diet: EN  Pain/Anxiety/Delirium protocol (if indicated): Prn fent  VAP protocol (if indicated): In place  DVT prophylaxis: SCD's in setting of coagulopathy  GI prophylaxis: PPI  Glucose control: SSI, sensitive scale Mobility: Bed rest  Code Status: LCB  Family Communication: Will speak with family upon arrival today 08/10/2019  Disposition: ICU   Labs   CBC: Recent Labs  Lab 08/05/19 1010  08/05/19 1513 08/06/19 0240  08/07/19 0248 08/08/19 0303 08/09/19 0232 08/09/19 1311 08/10/19 0226  WBC 53.8*  --  49.1* 45.9*  --  44.7* 47.7* 57.6*  --  43.9*  NEUTROABS 50.6*  --  42.5*  --   --   --   --   --   --   --   HGB 8.1*   < > 7.3* 7.6*   < > 7.1* 7.5* 5.6* 10.8* 11.2*  HCT 23.0*   < > 21.0* 20.9*   < > 20.4* 21.7* 16.9* 30.4* 31.6*  MCV 109.0*  --  108.8* 105.6*  --  107.9* 108.5* 112.7*  --  99.4  PLT 145*  --  111* 91*  --  69* 51* 50*  --  57*   < > = values in this interval not displayed.    Basic Metabolic Panel: Recent Labs  Lab 08/07/19 0248 08/07/19 1615 08/08/19 0303 08/08/19 0304 08/08/19 1614 08/09/19 0232 08/09/19 1616 08/10/19 0226  NA 139 137 137 138 138 139 138 137  136  K 3.7 4.0 3.9 3.9 3.8 3.8 3.8 4.0  3.9  CL 101 102 101 102 104 105 105 104  104  CO2 24 25 23 24 23 24 24 24  23   GLUCOSE 198* 159* 173* 171* 231* 194* 255* 222*  220*  BUN 17 14 13 14 16 16 18 18  19   CREATININE 1.50* 1.28* 1.19* 1.16* 1.13* 1.03* 1.06* 0.97  1.01*  CALCIUM 7.0* 7.4* 7.3* 7.4* 7.3* 7.2* 7.3* 7.4*  7.4*  MG 2.3 2.3 2.4  --   --  2.5*  --  2.5*  PHOS 2.6 2.5  2.5 3.1 3.2 2.6 2.2* 2.6 3.0  2.9   GFR: Estimated Creatinine  Clearance: 70.1 mL/min (by C-G formula based on SCr of 0.97 mg/dL). Recent Labs  Lab 08/05/19 0441  08/05/19 1513  08/06/19 0948 08/06/19 2342 08/07/19 0248 08/08/19 0303 08/09/19 0232 08/10/19 0226  PROCALCITON  --   --   --   --   --   --  3.71 2.88  --   --   WBC  --    < > 49.1*   < >  --   --  44.7* 47.7* 57.6* 43.9*  LATICACIDVEN >11.0*  --  10.9*  --  9.4* 4.8*  --   --   --   --    < > = values in this interval not displayed.    Liver Function Tests: Recent Labs  Lab 08/06/19 0240  08/07/19 0248  08/08/19 0303 08/08/19 0304 08/08/19 1614 08/09/19 0232 08/09/19 1616 08/10/19 0226  AST 1,452*  --  1,612*  --  1,158*  --   --  574*  --  340*  ALT 227*  --  282*  --  298*  --   --  245*  --  213*  ALKPHOS 274*  --  259*  --  314*  --   --  296*  --  322*  BILITOT 33.1*  --  34.8*  --  40.0*  --   --  38.4*  --  41.8*  PROT 5.9*  --  5.5*  --  5.8*  --   --  5.4*  --  5.6*  ALBUMIN 2.9*  2.8*   < > 2.6*  2.6*   < > 2.6* 2.5* 2.3* 2.2*  2.2* 2.2* 2.3*  2.2*   < > = values in this interval not displayed.   Recent Labs  Lab 08/28/2019 2011 08/05/19 2203  LIPASE 179* 104*   Recent Labs  Lab 08/14/2019 2011 08/07/19 0248  AMMONIA 241* 85*    ABG    Component Value Date/Time   PHART 7.403 08/06/2019 1133   PCO2ART 30.1 (L) 08/06/2019 1133   PO2ART 124.0 (H) 08/06/2019 1133   HCO3 18.9 (L) 08/06/2019 1133   TCO2 20 (L) 08/06/2019 1133   ACIDBASEDEF 5.0 (H) 08/06/2019 1133   O2SAT 99.0 08/06/2019 1133     Coagulation Profile: Recent Labs  Lab 08/06/19 0240 08/07/19 0248 08/08/19 0303 08/09/19 0232 08/10/19 0226  INR 4.1* 3.3* 2.2* 1.8* 1.7*    Cardiac Enzymes: Recent Labs  Lab 08/05/19 1010  CKTOTAL 115    HbA1C: Hgb A1c MFr Bld  Date/Time Value Ref Range Status  08/16/2019 11:05 PM 4.7 (L) 4.8 - 5.6 % Final    Comment:    (NOTE) Pre diabetes:          5.7%-6.4% Diabetes:              >6.4% Glycemic control for   <7.0% adults with  diabetes   02/27/2016 09:30 AM 5.3 <5.7 % Final    Comment:      For the purpose of screening for the presence of diabetes:   <5.7%       Consistent with the absence of diabetes 5.7-6.4 %   Consistent with increased risk for diabetes (prediabetes) >=6.5 %     Consistent with diabetes   This assay result is consistent with a decreased risk of diabetes.   Currently, no consensus exists regarding use of hemoglobin A1c for diagnosis of diabetes in children.   According to American Diabetes Association (ADA) guidelines, hemoglobin A1c <7.0% represents optimal control in non-pregnant diabetic patients. Different metrics may apply to specific patient populations. Standards of Medical Care in Diabetes (ADA).       CBG: Recent Labs  Lab 08/09/19 1614 08/09/19 1958 08/09/19 2332 08/10/19 0313 08/10/19 0751  GLUCAP 239* 216* 206* 203* 204*      Critical care time: 40 minutes     CRITICAL CARE  Total critical care time: 45 minutes  Critical care time was exclusive of separately billable procedures and treating other patients. Critical care was necessary to treat or prevent  imminent or life-threatening deterioration. Critical care was time spent personally by me on the following activities: development of treatment plan with patient and/or surrogate as well as nursing, discussions with consultants, evaluation of patient's response to treatment, examination of patient, obtaining history from patient or surrogate, ordering and performing treatments and interventions, ordering and review of laboratory studies, ordering and review of radiographic studies, pulse oximetry and re-evaluation of patient's condition.  Eliseo Gum MSN, AGACNP-BC [facility] [id number] If no answer, [id number] 08/10/2019, 8:44 AM

## 2019-08-10 NOTE — Plan of Care (Addendum)
Met with son in person and family over phone; clarified goals of care that we will continue maximal medical therapy and all interventions but in the event of clinical deterioration, allow her to pass away in peace without chest compressions, ACLS meds, or shocks.  We are going to give her a shot at extubation with plans to reintubate if needed.  If re-intubated, please limit sedation to PRN low dose fentanyl.  16 minutes advanced care planning.

## 2019-08-10 NOTE — Progress Notes (Signed)
  Echocardiogram 2D Echocardiogram has been performed.  Victoria Osborne L Androw 08/10/2019, 11:45 AM

## 2019-08-10 NOTE — Progress Notes (Signed)
 Nutrition Follow-up  DOCUMENTATION CODES:   Non-severe (moderate) malnutrition in context of chronic illness  INTERVENTION:   Tube Feeding:  Vital AF 1.2 at 65 ml/hr Provides 1872 kcals, 117 g of protein and 1264 mL of free water Meets 100% estimated calorie and protein needs   NUTRITION DIAGNOSIS:   Moderate Malnutrition related to chronic illness as evidenced by mild fat depletion, mild muscle depletion.  Being addressed via TF   GOAL:   Patient will meet greater than or equal to 90% of their needs  Progressing  MONITOR:   TF tolerance, Vent status, Labs, Weight trends  REASON FOR ASSESSMENT:   Ventilator    ASSESSMENT:   58 yo female admitted with severe alcoholic hepatitis, acute respiratory failure with inability to protect airway requiring intubation, shock requiring pressors, AKI with acidosis,  PMH EtOH abuse, anxiety, depression  9/02 Admit, Intubated 9/03 CRRT initiated 9/04 TF initiated  Pt remains on CRRT Patient is currently intubated on ventilator support, on levophed and vasopressin MV: 7.9 L/min Temp (24hrs), Avg:97.7 F (36.5 C), Min:97.5 F (36.4 C), Max:98 F (36.7 C)  Tolerating Vital AF 1.2 at 55 ml/hr, Pro-Stat 30 mL BID  Rectal tube in place; 400 mL of stool in bag; pale yellow/tan appearing stool. Concern for maldigestion/malabsorption given color of stool and liver failure  Current wt 81.1 kg; admission weight 66 kg; +10 L per I/O flow sheet, +edema  Labs: CBGs 203-239, phosphorus 3.0 Meds: lactulose, B-complex with C, folic acid, ss novolog, novolog q 4 hours, thiamine  NUTRITION - FOCUSED PHYSICAL EXAM:    Most Recent Value  Orbital Region  Mild depletion  Upper Arm Region  Mild depletion  Thoracic and Lumbar Region  Unable to assess  Buccal Region  Unable to assess  Temple Region  Moderate depletion  Clavicle Bone Region  Moderate depletion  Clavicle and Acromion Bone Region  Severe depletion  Scapular Bone Region   Moderate depletion  Dorsal Hand  Unable to assess  Patellar Region  Moderate depletion  Anterior Thigh Region  Moderate depletion  Posterior Calf Region  Severe depletion  Edema (RD Assessment)  Moderate  Skin  -- [jaundice]       Diet Order:   Diet Order            Diet NPO time specified  Diet effective now              EDUCATION NEEDS:   Not appropriate for education at this time  Skin:  Skin Assessment: Skin Integrity Issues: Skin Integrity Issues:: Stage I, DTI DTI: heel Stage I: sacrum  Last BM:  9/8 rectal tube (pale yellow/tan stool)  Height:   Ht Readings from Last 1 Encounters:  09/01/2019 5\' 7"  (1.702 m)    Weight:   Wt Readings from Last 1 Encounters:  08/10/19 81.1 kg    Ideal Body Weight:  61.4 kg  BMI:  Body mass index is 28 kg/m.  Estimated Nutritional Needs:   Kcal:  1580-1980 kcals  Protein:  110-135 g  Fluid:  >/= 1.5 L    Leba Tibbitts MS, RDN, LDN, CNSC [phone] Pager  [phone] Weekend/On-Call Pager

## 2019-08-10 NOTE — Procedures (Signed)
Admit: 08/12/2019 LOS: 70  58 year old F chronic alcohol abuse, alcoholic hepatitis, dialysis dependent AKI, Klebsiella bacteremia and UTI, VDRF  Current CRRT Prescription: Start Date: 08/07/19 Catheter: R IJ Temp HD cath placed 9/3 CCM BFR: 300 Pre Blood Pump: 300 4K DFR: 1500 4K  Replacement Rate: 400 4K Goal UF: Net even Anticoagulation: none Clotting: infrequent  S: Remains on NE and VP K 4.0, P 2.5 No major interval changes Anuric  O: 09/07 0701 - 09/08 0700 In: 3865.3 [I.V.:1395.6; Blood:630; FA/OZ:3086; IV Piggyback:354.7] Out: 5784 [Urine:18; Stool:625]  Filed Weights   08/08/19 0320 08/09/19 0500 08/10/19 0323  Weight: 80.1 kg 80 kg 81.1 kg    Recent Labs  Lab 08/09/19 0232 08/09/19 1616 08/10/19 0226  NA 139 138 137  136  K 3.8 3.8 4.0  3.9  CL 105 105 104  104  CO2 '24 24 24  23  '$ GLUCOSE 194* 255* 222*  220*  BUN '16 18 18  19  '$ CREATININE 1.03* 1.06* 0.97  1.01*  CALCIUM 7.2* 7.3* 7.4*  7.4*  PHOS 2.2* 2.6 3.0  2.9   Recent Labs  Lab 08/05/19 1010  08/05/19 1513  08/08/19 0303 08/09/19 0232 08/09/19 1311 08/10/19 0226  WBC 53.8*  --  49.1*   < > 47.7* 57.6*  --  43.9*  NEUTROABS 50.6*  --  42.5*  --   --   --   --   --   HGB 8.1*   < > 7.3*   < > 7.5* 5.6* 10.8* 11.2*  HCT 23.0*   < > 21.0*   < > 21.7* 16.9* 30.4* 31.6*  MCV 109.0*  --  108.8*   < > 108.5* 112.7*  --  99.4  PLT 145*  --  111*   < > 51* 50*  --  57*   < > = values in this interval not displayed.    Scheduled Meds: . B-complex with vitamin C  1 tablet Per Tube Daily  . chlorhexidine gluconate (MEDLINE KIT)  15 mL Mouth Rinse BID  . Chlorhexidine Gluconate Cloth  6 each Topical Daily  . feeding supplement (PRO-STAT SUGAR FREE 64)  30 mL Per Tube BID  . folic acid  1 mg Per Tube Daily  . insulin aspart  0-9 Units Subcutaneous Q4H  . lactulose  20 g Per Tube TID  . mouth rinse  15 mL Mouth Rinse 10 times per day  . methylPREDNISolone (SOLU-MEDROL) injection  60 mg  Intravenous Q12H  . pantoprazole sodium  40 mg Per Tube QHS  . thiamine  100 mg Per Tube Daily   Continuous Infusions: .  prismasol BGK 4/2.5 300 mL/hr at 08/09/19 2135  .  prismasol BGK 4/2.5 400 mL/hr at 08/09/19 2110  . sodium chloride 10 mL/hr at 08/10/19 0900  . cefTRIAXone (ROCEPHIN)  IV Stopped (08/09/19 2044)  . feeding supplement (VITAL AF 1.2 CAL) 55 mL/hr at 08/10/19 0900  . fentaNYL infusion INTRAVENOUS 150 mcg/hr (08/10/19 0900)  . norepinephrine (LEVOPHED) Adult infusion 15 mcg/min (08/10/19 0900)  . prismasol BGK 4/2.5 1,500 mL/hr at 08/10/19 0637  . vasopressin (PITRESSIN) infusion - *FOR SHOCK* 0.04 Units/min (08/10/19 0900)   PRN Meds:.fentaNYL (SUBLIMAZE) injection, heparin, midazolam  ABG    Component Value Date/Time   PHART 7.403 08/06/2019 1133   PCO2ART 30.1 (L) 08/06/2019 1133   PO2ART 124.0 (H) 08/06/2019 1133   HCO3 18.9 (L) 08/06/2019 1133   TCO2 20 (L) 08/06/2019 1133   ACIDBASEDEF 5.0 (H) 08/06/2019  1133   O2SAT 99.0 08/06/2019 1133    A/P  1. Dialysis dependent anuric AKI on CRRT, stable; normal baseline GFR 2. Acute alcoholic hepatitis, severe, on steroids; GI following 3. Shock, septic: Requiring vasopressors, Per CCM 4. Metabolic acidosis, improved on CRRT 5. AMS 6. VDRF 7. Anemia, requiring intermittent transfusion 8. Leukocytosis, on steroids 9. Klebsiella bacteremia+UTI, E coli UTI, ceftriaxone  Continue CRRT at current settings, net even UF, will continue to follow closely.  Pearson Grippe, MD [facility]

## 2019-08-10 NOTE — Progress Notes (Signed)
Attempted to insert NG tube but unsucceful. Spo2 began to drop and patient was not tolerating procedure. E-link notified.

## 2019-08-10 NOTE — Progress Notes (Signed)
[facility] Progress Note Patient Name: Tia Bedwell DOB: 26-Jul-1961 MRN: [id number]   Date of Service  08/10/2019  HPI/Events of Note  Bedside nurse unable to place NGT. Request to change per tube medications to IV.  Xifaxan can't be changed to IV/PR.  eICU Interventions  Will change Thiamine, Protonix, Folic Acid to IV route and Lactulose to PR route.      Intervention Category Major Interventions: Other:  Lysle Dingwall 08/10/2019, 9:54 PM

## 2019-08-11 ENCOUNTER — Inpatient Hospital Stay (HOSPITAL_COMMUNITY): Payer: BLUE CROSS/BLUE SHIELD

## 2019-08-11 LAB — RENAL FUNCTION PANEL
Albumin: 1.9 g/dL — ABNORMAL LOW (ref 3.5–5.0)
Albumin: 1.8 g/dL — ABNORMAL LOW (ref 3.5–5.0)
Anion gap: 7 (ref 5–15)
Anion gap: 9 (ref 5–15)
BUN: 20 mg/dL (ref 6–20)
BUN: 21 mg/dL — ABNORMAL HIGH (ref 6–20)
CO2: 25 mmol/L (ref 22–32)
CO2: 23 mmol/L (ref 22–32)
Calcium: 6.5 mg/dL — ABNORMAL LOW (ref 8.9–10.3)
Calcium: 7.3 mg/dL — ABNORMAL LOW (ref 8.9–10.3)
Chloride: 105 mmol/L (ref 98–111)
Chloride: 103 mmol/L (ref 98–111)
Creatinine, Ser: 0.92 mg/dL (ref 0.44–1.00)
Creatinine, Ser: 0.88 mg/dL (ref 0.44–1.00)
GFR calc Af Amer: >60 mL/min (ref >60)
GFR calc Af Amer: >60 mL/min (ref >60)
GFR calc non Af Amer: >60 mL/min (ref >60)
GFR calc non Af Amer: >60 mL/min (ref >60)
Glucose, Bld: 154 mg/dL — ABNORMAL HIGH (ref 70–99)
Glucose, Bld: 174 mg/dL — ABNORMAL HIGH (ref 70–99)
Phosphorus: 2.5 mg/dL (ref 2.5–4.6)
Phosphorus: 2.6 mg/dL (ref 2.5–4.6)
Potassium: 4.1 mmol/L (ref 3.5–5.1)
Potassium: 3.7 mmol/L (ref 3.5–5.1)
Sodium: 137 mmol/L (ref 135–145)
Sodium: 135 mmol/L (ref 135–145)

## 2019-08-11 LAB — CBC
HCT: 28.2 % — ABNORMAL LOW (ref 36.0–46.0)
Hemoglobin: 9.9 g/dL — ABNORMAL LOW (ref 12.0–15.0)
MCH: 35.4 pg — ABNORMAL HIGH (ref 26.0–34.0)
MCHC: 35.1 g/dL (ref 30.0–36.0)
MCV: 100.7 fL — ABNORMAL HIGH (ref 80.0–100.0)
Platelets: 47 10*3/uL — ABNORMAL LOW (ref 150–400)
RBC: 2.8 MIL/uL — ABNORMAL LOW (ref 3.87–5.11)
RDW: 24.8 % — ABNORMAL HIGH (ref 11.5–15.5)
WBC: 37.6 10*3/uL — ABNORMAL HIGH (ref 4.0–10.5)
nRBC: 4.3 % — ABNORMAL HIGH (ref 0.0–0.2)

## 2019-08-11 LAB — GLUCOSE, CAPILLARY
Glucose-Capillary: 150 mg/dL — ABNORMAL HIGH (ref 70–99)
Glucose-Capillary: 155 mg/dL — ABNORMAL HIGH (ref 70–99)
Glucose-Capillary: 138 mg/dL — ABNORMAL HIGH (ref 70–99)
Glucose-Capillary: 154 mg/dL — ABNORMAL HIGH (ref 70–99)
Glucose-Capillary: 171 mg/dL — ABNORMAL HIGH (ref 70–99)

## 2019-08-11 LAB — DIC (DISSEMINATED INTRAVASCULAR COAGULATION)PANEL
D-Dimer, Quant: 8.43 ug/mL-FEU — ABNORMAL HIGH (ref 0.00–0.50)
Fibrinogen: 163 mg/dL — ABNORMAL LOW (ref 210–475)
INR: 1.9 — ABNORMAL HIGH (ref 0.8–1.2)
Platelets: 48 10*3/uL — ABNORMAL LOW (ref 150–400)
Prothrombin Time: 21.6 seconds — ABNORMAL HIGH (ref 11.4–15.2)
aPTT: 50 seconds — ABNORMAL HIGH (ref 24–36)

## 2019-08-11 LAB — AMMONIA: Ammonia: 99 umol/L — ABNORMAL HIGH (ref 9–35)

## 2019-08-11 LAB — MAGNESIUM: Magnesium: 2.5 mg/dL — ABNORMAL HIGH (ref 1.7–2.4)

## 2019-08-11 MED ORDER — MIDODRINE HCL 5 MG PO TABS
10.0000 mg | ORAL_TABLET | Freq: Three times a day (TID) | ORAL | Status: DC
  Administered 2019-08-11 – 2019-08-14 (×10): 10 mg
  Filled 2019-08-11 (×12): qty 2

## 2019-08-11 MED ORDER — VITAMIN B-1 100 MG PO TABS
100.0000 mg | ORAL_TABLET | Freq: Every day | ORAL | Status: DC
  Administered 2019-08-11 – 2019-08-12 (×2): 100 mg via ORAL
  Filled 2019-08-11 (×3): qty 1

## 2019-08-11 MED ORDER — LACTULOSE 10 GM/15ML PO SOLN
20.0000 g | Freq: Three times a day (TID) | ORAL | Status: DC
  Administered 2019-08-11 – 2019-08-12 (×5): 20 g via ORAL
  Filled 2019-08-11 (×6): qty 30

## 2019-08-11 MED ORDER — MIDODRINE HCL 5 MG PO TABS
5.0000 mg | ORAL_TABLET | Freq: Three times a day (TID) | ORAL | Status: DC
  Administered 2019-08-11: 5 mg
  Filled 2019-08-11: qty 1

## 2019-08-11 MED ORDER — RIFAXIMIN 550 MG PO TABS
550.0000 mg | ORAL_TABLET | Freq: Two times a day (BID) | ORAL | Status: DC
  Administered 2019-08-11 – 2019-08-14 (×8): 550 mg
  Filled 2019-08-11 (×8): qty 1

## 2019-08-11 MED ORDER — FOLIC ACID 1 MG PO TABS
1.0000 mg | ORAL_TABLET | Freq: Every day | ORAL | Status: DC
  Administered 2019-08-11 – 2019-08-12 (×2): 1 mg via ORAL
  Filled 2019-08-11 (×3): qty 1

## 2019-08-11 MED ORDER — PREDNISOLONE SODIUM PHOSPHATE 15 MG/5ML PO SOLN
40.0000 mg | Freq: Every day | ORAL | Status: DC
  Administered 2019-08-11 – 2019-08-14 (×4): 40 mg
  Filled 2019-08-11 (×5): qty 15

## 2019-08-11 NOTE — Procedures (Signed)
Admit: 08/24/2019 LOS: 30  58 year old F chronic alcohol abuse, alcoholic hepatitis, dialysis dependent AKI, Klebsiella bacteremia and UTI, VDRF  Current CRRT Prescription: Start Date: 08/07/19 Catheter: R IJ Temp HD cath placed 9/3 CCM BFR: 300 Pre Blood Pump: 300 4K DFR: 1500 4K  Replacement Rate: 400 4K Goal UF: Net even Anticoagulation: none Clotting: infrequent  S: Extubated successfully Off vasopressin, on lower dose norepinephrine Anuric K4.1, phosphorus 2.5  O: 09/08 0701 - 09/09 0700 In: 2312.2 [I.V.:802.2; NG/GT:1110; IV Piggyback:100] Out: 2311 [Urine:15; Emesis/NG output:200; Stool:900]  Filed Weights   08/09/19 0500 08/10/19 0323 08/11/19 0323  Weight: 80 kg 81.1 kg 75.1 kg    Recent Labs  Lab 08/10/19 0226 08/10/19 1700 08/11/19 0254  NA 137  136 134* 137  K 4.0  3.9 3.6 4.1  CL 104  104 102 105  CO2 24  23 21* 25  GLUCOSE 222*  220* 224* 154*  BUN _0 CREATININE 0.97  1.01* 0.92 0.92  CALCIUM 7.4*  7.4* 7.2* 6.5*  PHOS 3.0  2.9 2.5 2.5   Recent Labs  Lab 08/05/19 1010  08/05/19 1513  08/09/19 0232 08/09/19 1311 08/10/19 0226 08/11/19 0254  WBC 53.8*  --  49.1*   < > 57.6*  --  43.9* 37.6*  NEUTROABS 50.6*  --  42.5*  --   --   --   --   --   HGB 8.1*   < > 7.3*   < > 5.6* 10.8* 11.2* 9.9*  HCT 23.0*   < > 21.0*   < > 16.9* 30.4* 31.6* 28.2*  MCV 109.0*  --  108.8*   < > 112.7*  --  99.4 100.7*  PLT 145*  --  111*   < > 50*  --  57* 48*  47*   < > = values in this interval not displayed.    Scheduled Meds: . B-complex with vitamin C  1 tablet Per Tube Daily  . chlorhexidine gluconate (MEDLINE KIT)  15 mL Mouth Rinse BID  . Chlorhexidine Gluconate Cloth  6 each Topical Daily  . folic acid  1 mg Oral Daily  . insulin aspart  0-9 Units Subcutaneous Q4H  . insulin aspart  2 Units Subcutaneous Q4H  . ipratropium-albuterol  3 mL Nebulization Q6H  . lactulose  20 g Oral TID  . mouth rinse  15 mL Mouth Rinse 10 times per  day  . midodrine  5 mg Per Tube TID WC  . pantoprazole (PROTONIX) IV  40 mg Intravenous Q24H  . prednisoLONE  40 mg Per Tube Daily  . rifaximin  550 mg Per Tube BID  . thiamine  100 mg Oral Daily   Continuous Infusions: .  prismasol BGK 4/2.5 300 mL/hr at 08/11/19 0801  .  prismasol BGK 4/2.5 400 mL/hr at 08/10/19 2245  . sodium chloride 10 mL/hr at 08/11/19 1000  . cefTRIAXone (ROCEPHIN)  IV Stopped (08/10/19 2149)  . feeding supplement (VITAL AF 1.2 CAL) 1,000 mL (08/11/19 1004)  . norepinephrine (LEVOPHED) Adult infusion 8 mcg/min (08/11/19 1000)  . prismasol BGK 4/2.5 1,500 mL/hr at 08/11/19 0601   PRN Meds:.heparin  ABG    Component Value Date/Time   PHART 7.403 08/06/2019 1133   PCO2ART 30.1 (L) 08/06/2019 1133   PO2ART 124.0 (H) 08/06/2019 1133   HCO3 18.9 (L) 08/06/2019 1133   TCO2 20 (L) 08/06/2019 1133   ACIDBASEDEF 5.0 (H) 08/06/2019 1133   O2SAT 99.0 08/06/2019 1133  A/P  1. Dialysis dependent anuric AKI on CRRT, stable; normal baseline GFR 2. Acute alcoholic hepatitis, severe, on steroids; GI following 3. Shock, septic: Improving and remains on lower dose of norepinephrine, Per CCM 4. Metabolic acidosis, improved on CRRT 5. AMS 6. VDRF, extubated 9/8 successfully 7. Anemia, requiring intermittent transfusion 8. Leukocytosis, on steroids 9. Klebsiella bacteremia+UTI, E coli UTI, ceftriaxone  Continue CRRT at current settings, start to pull fluid, 50 mL/h net negative, will continue to follow closely.  Pearson Grippe, MD [facility]

## 2019-08-11 NOTE — Progress Notes (Signed)
Chaplain making rounds. Family was in the room visiting with pt. Family denied any current spiritual needs. Family thanked chaplain for stopping in. Chaplain remains available per request.  Chaplain Resident, Evelene Croon, M Div Pager # [phone]

## 2019-08-11 NOTE — Progress Notes (Signed)
PT Cancellation Note  Patient Details Name: Kellyn Kutsch MRN: [id number] DOB: 1961-10-09   Cancelled Treatment:    Reason Eval/Treat Not Completed: Active bedrest order; spoke with OT who completed bed level evaluation and reports pt moving but not to commands.  Will attempt another day.    Reginia Naas 08/11/2019, 12:15 PM  Magda Kiel, [place] [phone] 08/11/2019

## 2019-08-11 NOTE — Evaluation (Addendum)
Occupational Therapy Evaluation Patient Details Name: Victoria Osborne MRN: [id number] DOB: 05/02/1961 Today's Date: 08/11/2019    History of Present Illness 58 y/o F admitted 9/2 with reports of altered mental status. The patient was found at home lying on the floor unresponsive, had noted visible jaundice the week preceding admit. Work up notable for GNR UTI, bacteremia (klebsiella on BCID).  Pt requiring mechanical ventilation, vasopressors and CVVHD, extubated on 9/8. PMHx includes anxiety, depression, restless leg syndrome, and substance abuse.    Clinical Impression   RN verbalizing clearance for bed level evaluation. Limited eval as pt with waxing/waning level of arousal and cognition at this time. Pt will arouse to name but difficulty focusing/making eye contact with therapist, not following commands but occasionally verbalizing simple words. Pt spontaneously moving extremities but not on command, noted only intentional movement pt attempting to reach for NG with UE. Son present and provides PLOF, reports pt independent with ADL, iADL and mobility. Pt will benefit from continued acute OT services and currently recommend post acute therapies at SNF level to further progress pt towards PLOF. Will continue to follow to further assess and progress pt's mobility/ADL status.     Follow Up Recommendations  SNF    Equipment Recommendations  Other (comment)(TBD)           Precautions / Restrictions Precautions Precautions: Fall;Other (comment) Precaution Comments: NG, CRRT, wrist restraints, bil mitts Restrictions Weight Bearing Restrictions: No      Mobility Bed Mobility               General bed mobility comments: deferred, pt unable to follow commands to participate  Transfers                      Balance                                           ADL either performed or assessed with clinical judgement   ADL Overall ADL's : Needs  assistance/impaired                                       General ADL Comments: currently totalA, mostly due to impaired cognition at this time, will continue to [employer]         Perception     Praxis      Pertinent Vitals/Pain Pain Assessment: Faces Faces Pain Scale: Hurts a little bit Pain Location: generalized Pain Descriptors / Indicators: Grimacing Pain Intervention(s): Monitored during session     Hand Dominance     Extremity/Trunk Assessment Upper Extremity Assessment Upper Extremity Assessment: Difficult to assess due to impaired cognition(moving extremities spontaneously,attempting to reach for NG )   Lower Extremity Assessment Lower Extremity Assessment: Defer to PT evaluation       Communication Communication Communication: Expressive difficulties(mostly mumbled speech)   Cognition Arousal/Alertness: Lethargic Behavior During Therapy: Restless Overall Cognitive Status: Difficult to assess                                 General Comments: pt not following commands during session despite efforts from therapist, son; will intermittently respond to name and open eyes, mouthing words occasionally but limited; son reports alertness/engagement  has waxed/waned while he has been visiting   General Comments  VSS, son present throughout session    Exercises     Shoulder Howard expects to be discharged to:: Private residence Living Arrangements: Alone Available Help at Discharge: Family;Friend(s);Available PRN/intermittently Type of Home: Apartment Home Access: Stairs to enter Entrance Stairs-Number of Steps: flight   Home Layout: One level     Bathroom Shower/Tub: Teacher, early years/pre: Standard         Additional Comments: home setup obtained from son, son is in from [place]      Prior Functioning/Environment Level of Independence: Independent         Comments: working, driving        OT Problem List: Decreased strength;Decreased range of motion;Decreased activity tolerance;Impaired balance (sitting and/or standing);Decreased cognition;Decreased safety awareness;Decreased knowledge of use of DME or AE;Decreased knowledge of precautions      OT Treatment/Interventions: Self-care/ADL training;Therapeutic exercise;Energy conservation;Neuromuscular education;Therapeutic activities;Cognitive remediation/compensation;Visual/perceptual remediation/compensation;Patient/family education;Balance training;DME and/or AE instruction    OT Goals(Current goals can be found in the care plan section) Acute Rehab OT Goals Patient Stated Goal: none stated OT Goal Formulation: Patient unable to participate in goal setting Time For Goal Achievement: 08/25/19 Potential to Achieve Goals: Fair  OT Frequency: Min 2X/week   Barriers to D/C:            Co-evaluation              AM-PAC OT "6 Clicks" Daily Activity     Outcome Measure Help from another person eating meals?: Total Help from another person taking care of personal grooming?: Total Help from another person toileting, which includes using toliet, bedpan, or urinal?: Total Help from another person bathing (including washing, rinsing, drying)?: Total Help from another person to put on and taking off regular upper body clothing?: Total Help from another person to put on and taking off regular lower body clothing?: Total 6 Click Score: 6   End of Session Nurse Communication: Mobility status  Activity Tolerance: Patient limited by lethargy;Other (comment)(limited due to level of arousal/cognition) Patient left: in bed;with call bell/phone within reach;with restraints reapplied;with family/visitor present  OT Visit Diagnosis: Other symptoms and signs involving cognitive function;History of falling (Z91.81)                Time: [id number] OT Time Calculation (min): 11 min Charges:  OT  General Charges $OT Visit: 1 Visit OT Evaluation $OT Eval Moderate Complexity: 1 Mod  Lou Cal, OT [employer] Pager [phone] Office [phone]   Raymondo Band 08/11/2019, 2:44 PM

## 2019-08-11 NOTE — Progress Notes (Signed)
SLP Cancellation Note  Patient Details Name: Victoria Osborne MRN: [id number] DOB: 02/18/1961   Cancelled treatment:       Reason Eval/Treat Not Completed: Patient not medically ready. Pt was extubated 08/10/2019 at [facility].  RN indicates pt is awake, but poorly responsive, and not appropriate for BSE at this time. Will hold eval at this time and continue efforts as pt status improves. Thank you for this referral.  Sharonne Ricketts B. Quentin Ore [facility], [place] Speech Language Pathologist [phone]  Shonna Chock 08/11/2019, 9:16 AM

## 2019-08-11 NOTE — Progress Notes (Signed)
Assisted tele visit to patient with pt boyfriend.  Maryelizabeth Rowan, RN

## 2019-08-11 NOTE — Progress Notes (Signed)
NAME:  Victoria Osborne, MRN:  [id number], DOB:  07-06-1961, LOS: 7 ADMISSION DATE:  08/28/2019, CONSULTATION DATE:  9/2 REFERRING MD:  ER, CHIEF COMPLAINT:  AMS    Brief History   58 y/o F admitted 9/2 with reports of altered mental status.  The patient was found at home lying on the floor unresponsive with 9 empty bottles of vodka.  He had noted visible jaundice the week preceding admit.  Work up notable for GNR UTI, bacteremia (klebsiella on BCID).  Requiring mechanical ventilation, vasopressors and CVVHD.   Past Medical History  Anxiety  Depression  ETOH Abuse  Significant Hospital Events   9/02 Admit  9/03 Arrived to ICU obtunded, tachypneic > intubated 9/05 CVVHD, fentanyl gtt (5mcg), levophed 26 mcg, neosynephrine, vaso gtt's 9/06 Levo down to 22, vaso, fent increased to 200 mcg 9/8 weaning sedation, continuing CRRT. Interval increase in RLL opacity. Interval decrease in WBC, transaminases. Extubated. 9/9 Off Vaso, weaning NE. Remains on CRRT, remains extubated.   Consults:  PCCM   Procedures:  ETT 9/3 > 9/8 Central line 9/3 >>  Aline 9/3 >>   Significant Diagnostic Tests:   Abd [place] 9/2> hepatic steatosis, cholelithiasis with associated gallbladder wall thickening.  Renal US 9/4> no hydronephrosis or renal mass CXR 9/8> Increased RLL opacity CXR 9/9> Interval removal of ETT. Mild interval improvement to R sided opacity. L sided atelectasis   Micro Data:  COVID 9/2 >> negative  BCID 9/2 >> klebsiella  BCx2 9/2 >> Klebsiella pneumoniae >> S-ceftriaxone UC 9/3 >> Klebsiella pneumoniae >> S-ceftriaxone  >> E-Coli >> S-ceftriaxone Tracheal aspirate 9/8>> Moderate yeast, rare GNR  Antimicrobials:  Vanco 9/2 >> 9/4 Cefepime 9/2 >> 9/4  Ceftriaxone 9/4 >>   Interim history/subjective:  Extubated late in shift yesterday afternoon  On CRRT Off Vaso, weaning NE   WBC continues to slowly decline, to 37.6 today. Ammonia remains elevated, with increase to 99.    Objective   Blood pressure (!) 108/45, pulse 73, temperature 97.6 F (36.4 C), temperature source Axillary, resp. rate (!) 22, height 5\' 7"  (1.702 m), weight 75.1 kg, SpO2 95 %.    Vent Mode: PRVC FiO2 (%):  [32 %-40 %] 32 % Set Rate:  [16 bmp] 16 bmp Vt Set:  [490 mL] 490 mL PEEP:  [5 cmH20] 5 cmH20 Plateau Pressure:  [16 cmH20-17 cmH20] 16 cmH20   Intake/Output Summary (Last 24 hours) at 08/11/2019 0859 Last data filed at 08/11/2019 0800 Gross per 24 hour  Intake 2223.92 ml  Output 2199 ml  Net 24.92 ml   Filed Weights   08/09/19 0500 08/10/19 0323 08/11/19 0323  Weight: 80 kg 81.1 kg 75.1 kg    Examination: General: Critically and chronically ill appearing adult F, reclined in bed on CRRT, NAD  HEENT: NCAT. Icteric sclera. Pink mmm. Patent nares with Sanders in place. Trachea midline  Neuro: Drowsy. Awakens to voice. Oriented to self. Intermittently following commands. PERRL  CV: RRR s1s2 no rgm. Cap refill < 3 sec BUE BLE.  PULM:  CTA bilaterally. Symmetrical chest expansion. No accessory muscle use  GI: Soft, round, ndnt. Normoactive x4.  Extremities: Anasarca. Symmetrical bulk and tone, no obvious joint deformity  Skin: Jaundice. Scattered petechiae chest and abdomen. Scattered ecchymosis.   Resolved Hospital Problem list      Assessment & Plan:   Acute Hypoxic Respiratory Failure  -Extubated 9/8 -CXR with RLL opacity, LLL atelectasis  P: Supplemental O2 for SpO2 > 92% CPT IS/Flutter  BDs  Septic Shock secondary to Klebsiella + E-Coli UTI, Klebsiella Bacteremia   Leukocytosis, improving P: NE for MAP > 65, wean as able  Adding Midodrine   Continue rocephin for bacteremia and UTI  Acute metabolic encephalopathy  -in setting of hepatic failure, uremia, CNS depressing medications, septic shock  P: Continue abx, lactulose, CRRT as below Maintain off sedation RN delirium precautions bundle Advance mobility efforts as able   AKI with uremia  -inability to clear  lactate due to hepatic failure  P: CRRT per nephrology Trend renal function MAP goal > 65 as above for renal perfusion Adding Midodrine Monitor UOP   Alcoholic hepatitis Shock Liver-- transaminitis improving  -GI signed off 9/5, following PRN P: Trend coags as below Steroids for 26 day course -- changing IC solumedrol to enteral prednisolone Continue rifaxamin  Continue lactulose   DIC -in setting of hepatic failure, septic shock  -9/8: Plt 47, + schistocytes, Fibrinogen 163, PT 21.6, aPTT 50, INR 1.9 P: Features of DIC, no active bleeding  Trend CBC, monitor for s/sx bleeding If bleeding occurs will need balanced resuscitation  SCDs no chemical VTE ppx   Malnutrition, severe EtOH abuse P: PPI  EN per RDN  Multivitamins, B vitamins    Best practice:  Diet: EN  Pain/Anxiety/Delirium protocol (if indicated): none VAP protocol (if indicated):none  DVT prophylaxis: SCD's in setting of DIC GI prophylaxis: PPI  Glucose control: SSI, sensitive scale Mobility: Bed rest  Code Status: LCB  Family Communication: Merry Proud, son is primary contact. Discussed 9/8 at bedside, will update son upon arrival today 9/9 as well  Disposition: ICU   Labs   CBC: Recent Labs  Lab 08/05/19 1010  08/05/19 1513  08/07/19 0248 08/08/19 0303 08/09/19 0232 08/09/19 1311 08/10/19 0226 08/11/19 0254  WBC 53.8*  --  49.1*   < > 44.7* 47.7* 57.6*  --  43.9* 37.6*  NEUTROABS 50.6*  --  42.5*  --   --   --   --   --   --   --   HGB 8.1*   < > 7.3*   < > 7.1* 7.5* 5.6* 10.8* 11.2* 9.9*  HCT 23.0*   < > 21.0*   < > 20.4* 21.7* 16.9* 30.4* 31.6* 28.2*  MCV 109.0*  --  108.8*   < > 107.9* 108.5* 112.7*  --  99.4 100.7*  PLT 145*  --  111*   < > 69* 51* 50*  --  57* 48*  47*   < > = values in this interval not displayed.    Basic Metabolic Panel: Recent Labs  Lab 08/07/19 1615 08/08/19 0303  08/09/19 0232 08/09/19 1616 08/10/19 0226 08/10/19 1700 08/11/19 0254  NA 137 137   < > 139 138  137  136 134* 137  K 4.0 3.9   < > 3.8 3.8 4.0  3.9 3.6 4.1  CL 102 101   < > 105 105 104  104 102 105  CO2 25 23   < > 24 24 24  23  21* 25  GLUCOSE 159* 173*   < > 194* 255* 222*  220* 224* 154*  BUN 14 13   < > 16 18 18  19 20 20   CREATININE 1.28* 1.19*   < > 1.03* 1.06* 0.97  1.01* 0.92 0.92  CALCIUM 7.4* 7.3*   < > 7.2* 7.3* 7.4*  7.4* 7.2* 6.5*  MG 2.3 2.4  --  2.5*  --  2.5*  --  2.5*  PHOS 2.5  2.5 3.1   < > 2.2* 2.6 3.0  2.9 2.5 2.5   < > = values in this interval not displayed.   GFR: Estimated Creatinine Clearance: 71.4 mL/min (by C-G formula based on SCr of 0.92 mg/dL). Recent Labs  Lab 08/05/19 0441  08/05/19 1513  08/06/19 0948 08/06/19 2342 08/07/19 0248 08/08/19 0303 08/09/19 0232 08/10/19 0226 08/11/19 0254  PROCALCITON  --   --   --   --   --   --  3.71 2.88  --   --   --   WBC  --    < > 49.1*   < >  --   --  44.7* 47.7* 57.6* 43.9* 37.6*  LATICACIDVEN >11.0*  --  10.9*  --  9.4* 4.8*  --   --   --   --   --    < > = values in this interval not displayed.    Liver Function Tests: Recent Labs  Lab 08/07/19 0248  08/08/19 0303  08/09/19 0232 08/09/19 1616 08/10/19 0226 08/10/19 1049 08/10/19 1700 08/11/19 0254  AST 1,612*  --  1,158*  --  574*  --  340* 282*  --   --   ALT 282*  --  298*  --  245*  --  213* 195*  --   --   ALKPHOS 259*  --  314*  --  296*  --  322* 321*  --   --   BILITOT 34.8*  --  40.0*  --  38.4*  --  41.8* 34.6*  --   --   PROT 5.5*  --  5.8*  --  5.4*  --  5.6* 5.8*  --   --   ALBUMIN 2.6*  2.6*   < > 2.6*   < > 2.2*  2.2* 2.2* 2.3*  2.2* 2.2* 2.0* 1.9*   < > = values in this interval not displayed.   Recent Labs  Lab 08/06/2019 2011 08/05/19 2203  LIPASE 179* 104*   Recent Labs  Lab 08/21/2019 2011 08/07/19 0248 08/11/19 0254  AMMONIA 241* 85* 99*    ABG    Component Value Date/Time   PHART 7.403 08/06/2019 1133   PCO2ART 30.1 (L) 08/06/2019 1133   PO2ART 124.0 (H) 08/06/2019 1133   HCO3 18.9 (L)  08/06/2019 1133   TCO2 20 (L) 08/06/2019 1133   ACIDBASEDEF 5.0 (H) 08/06/2019 1133   O2SAT 99.0 08/06/2019 1133     Coagulation Profile: Recent Labs  Lab 08/07/19 0248 08/08/19 0303 08/09/19 0232 08/10/19 0226 08/11/19 0254  INR 3.3* 2.2* 1.8* 1.7* 1.9*    Cardiac Enzymes: Recent Labs  Lab 08/05/19 1010  CKTOTAL 115    HbA1C: Hgb A1c MFr Bld  Date/Time Value Ref Range Status  08/23/2019 11:05 PM 4.7 (L) 4.8 - 5.6 % Final    Comment:    (NOTE) Pre diabetes:          5.7%-6.4% Diabetes:              >6.4% Glycemic control for   <7.0% adults with diabetes   02/27/2016 09:30 AM 5.3 <5.7 % Final    Comment:      For the purpose of screening for the presence of diabetes:   <5.7%       Consistent with the absence of diabetes 5.7-6.4 %   Consistent with increased risk for diabetes (prediabetes) >=6.5 %     Consistent with diabetes   This  assay result is consistent with a decreased risk of diabetes.   Currently, no consensus exists regarding use of hemoglobin A1c for diagnosis of diabetes in children.   According to American Diabetes Association (ADA) guidelines, hemoglobin A1c <7.0% represents optimal control in non-pregnant diabetic patients. Different metrics may apply to specific patient populations. Standards of Medical Care in Diabetes (ADA).       CBG: Recent Labs  Lab 08/10/19 1612 08/10/19 2006 08/10/19 2343 08/11/19 0311 08/11/19 0753  GLUCAP 214* 174* 174* 150* 155*      CRITICAL CARE Performed by: Cristal Generous  Total critical care time: 45 minutes Critical care time was exclusive of separately billable procedures and treating other patients. Critical care was necessary to treat or prevent imminent or life-threatening deterioration.  Critical care was time spent personally by me on the following activities: development of treatment plan with patient and/or surrogate as well as nursing, discussions with consultants, evaluation of  patient's response to treatment, examination of patient, obtaining history from patient or surrogate, ordering and performing treatments and interventions, ordering and review of laboratory studies, ordering and review of radiographic studies, pulse oximetry and re-evaluation of patient's condition.  Eliseo Gum MSN, AGACNP-BC [place] [id number] If no answer, [phone] 08/11/2019, 8:59 AM

## 2019-08-12 LAB — RENAL FUNCTION PANEL
Albumin: 1.8 g/dL — ABNORMAL LOW (ref 3.5–5.0)
Anion gap: 10 (ref 5–15)
BUN: 17 mg/dL (ref 6–20)
CO2: 23 mmol/L (ref 22–32)
Calcium: 6.9 mg/dL — ABNORMAL LOW (ref 8.9–10.3)
Chloride: 105 mmol/L (ref 98–111)
Creatinine, Ser: 0.77 mg/dL (ref 0.44–1.00)
GFR calc Af Amer: >60 mL/min (ref >60)
GFR calc non Af Amer: >60 mL/min (ref >60)
Glucose, Bld: 204 mg/dL — ABNORMAL HIGH (ref 70–99)
Phosphorus: 4.5 mg/dL (ref 2.5–4.6)
Potassium: 3.5 mmol/L (ref 3.5–5.1)
Sodium: 138 mmol/L (ref 135–145)

## 2019-08-12 LAB — CBC
HCT: 29.6 % — ABNORMAL LOW (ref 36.0–46.0)
Hemoglobin: 10.1 g/dL — ABNORMAL LOW (ref 12.0–15.0)
MCH: 35.2 pg — ABNORMAL HIGH (ref 26.0–34.0)
MCHC: 34.1 g/dL (ref 30.0–36.0)
MCV: 103.1 fL — ABNORMAL HIGH (ref 80.0–100.0)
Platelets: 53 10*3/uL — ABNORMAL LOW (ref 150–400)
RBC: 2.87 MIL/uL — ABNORMAL LOW (ref 3.87–5.11)
RDW: 25.2 % — ABNORMAL HIGH (ref 11.5–15.5)
WBC: 38.3 10*3/uL — ABNORMAL HIGH (ref 4.0–10.5)
nRBC: 2.1 % — ABNORMAL HIGH (ref 0.0–0.2)

## 2019-08-12 LAB — COMPREHENSIVE METABOLIC PANEL
ALT: 131 U/L — ABNORMAL HIGH (ref 0–44)
AST: 182 U/L — ABNORMAL HIGH (ref 15–41)
Albumin: 1.8 g/dL — ABNORMAL LOW (ref 3.5–5.0)
Alkaline Phosphatase: 289 U/L — ABNORMAL HIGH (ref 38–126)
Anion gap: 10 (ref 5–15)
BUN: 20 mg/dL (ref 6–20)
CO2: 23 mmol/L (ref 22–32)
Calcium: 7.2 mg/dL — ABNORMAL LOW (ref 8.9–10.3)
Chloride: 104 mmol/L (ref 98–111)
Creatinine, Ser: 0.81 mg/dL (ref 0.44–1.00)
GFR calc Af Amer: >60 mL/min (ref >60)
GFR calc non Af Amer: >60 mL/min (ref >60)
Glucose, Bld: 170 mg/dL — ABNORMAL HIGH (ref 70–99)
Potassium: 3.7 mmol/L (ref 3.5–5.1)
Sodium: 137 mmol/L (ref 135–145)
Total Bilirubin: 36.2 mg/dL (ref 0.3–1.2)
Total Protein: 5 g/dL — ABNORMAL LOW (ref 6.5–8.1)

## 2019-08-12 LAB — CULTURE, RESPIRATORY W GRAM STAIN

## 2019-08-12 LAB — PROTIME-INR
INR: 1.9 — ABNORMAL HIGH (ref 0.8–1.2)
Prothrombin Time: 21.5 seconds — ABNORMAL HIGH (ref 11.4–15.2)

## 2019-08-12 LAB — MAGNESIUM: Magnesium: 2.5 mg/dL — ABNORMAL HIGH (ref 1.7–2.4)

## 2019-08-12 LAB — GLUCOSE, CAPILLARY
Glucose-Capillary: 130 mg/dL — ABNORMAL HIGH (ref 70–99)
Glucose-Capillary: 185 mg/dL — ABNORMAL HIGH (ref 70–99)
Glucose-Capillary: 156 mg/dL — ABNORMAL HIGH (ref 70–99)
Glucose-Capillary: 141 mg/dL — ABNORMAL HIGH (ref 70–99)
Glucose-Capillary: 188 mg/dL — ABNORMAL HIGH (ref 70–99)
Glucose-Capillary: 149 mg/dL — ABNORMAL HIGH (ref 70–99)

## 2019-08-12 LAB — PHOSPHORUS: Phosphorus: 2.3 mg/dL — ABNORMAL LOW (ref 2.5–4.6)

## 2019-08-12 LAB — FIBRINOGEN: Fibrinogen: 192 mg/dL — ABNORMAL LOW (ref 210–475)

## 2019-08-12 MED ORDER — IPRATROPIUM-ALBUTEROL 0.5-2.5 (3) MG/3ML IN SOLN
3.0000 mL | Freq: Three times a day (TID) | RESPIRATORY_TRACT | Status: DC
  Administered 2019-08-12 – 2019-08-13 (×2): 3 mL via RESPIRATORY_TRACT
  Filled 2019-08-12 (×2): qty 3

## 2019-08-12 MED ORDER — SODIUM PHOSPHATES 45 MMOLE/15ML IV SOLN
30.0000 mmol | Freq: Once | INTRAVENOUS | Status: AC
  Administered 2019-08-12: 30 mmol via INTRAVENOUS
  Filled 2019-08-12: qty 10

## 2019-08-12 NOTE — Progress Notes (Signed)
[facility] Progress Note Patient Name: Victoria Osborne DOB: 05-26-1961 MRN: [id number]   Date of Service  08/12/2019  HPI/Events of Note  Soft wrist restraints order expired and RN is requesting reorder.  eICU Interventions  Restraints order reordered.        Kerry Kass Ogan 08/12/2019, 12:57 AM

## 2019-08-12 NOTE — Progress Notes (Signed)
NAME:  Coby Bartoli, MRN:  [id number], DOB:  June 05, 1961, LOS: 38 ADMISSION DATE:  08/13/2019, CONSULTATION DATE:  9/2 REFERRING MD:  ER, CHIEF COMPLAINT:  AMS    Brief History   58 y/o F admitted 9/2 with reports of altered mental status.  The patient was found at home lying on the floor unresponsive with 9 empty bottles of vodka.  He had noted visible jaundice the week preceding admit.  Work up notable for GNR UTI, bacteremia (klebsiella on BCID).  Requiring mechanical ventilation, vasopressors and CVVHD.   Past Medical History  Anxiety  Depression  ETOH Abuse  Significant Hospital Events   9/02 Admit  9/03 Arrived to ICU obtunded, tachypneic > intubated 9/05 CVVHD, fentanyl gtt (21mcg), levophed 26 mcg, neosynephrine, vaso gtt's 9/06 Levo down to 22, vaso, fent increased to 200 mcg 9/8 weaning sedation, continuing CRRT. Interval increase in RLL opacity. Interval decrease in WBC, transaminases. Extubated. 9/9 Off Vaso, weaning NE. Remains on CRRT, remains extubated.   Consults:  PCCM   Procedures:  ETT 9/3 > 9/8 Central line 9/3 >>  Aline 9/3 >>   Significant Diagnostic Tests:   Abd [place] 9/2> hepatic steatosis, cholelithiasis with associated gallbladder wall thickening.  Renal US 9/4> no hydronephrosis or renal mass CXR 9/8> Increased RLL opacity CXR 9/9> Interval removal of ETT. Mild interval improvement to R sided opacity. L sided atelectasis   Micro Data:  COVID 9/2 >> negative  BCID 9/2 >> klebsiella  BCx2 9/2 >> Klebsiella pneumoniae >> S-ceftriaxone UC 9/3 >> Klebsiella pneumoniae >> S-ceftriaxone  >> E-Coli >> S-ceftriaxone Tracheal aspirate 9/8>> Moderate yeast, rare GNR  Antimicrobials:  Vanco 9/2 >> 9/4 Cefepime 9/2 >> 9/4  Ceftriaxone 9/4 >>   Interim history/subjective:  CRRT UF net (-) 50 Remains on low-dose NE More awake than yesterday  Objective   Blood pressure (!) 81/57, pulse 71, temperature (!) 96.1 F (35.6 C), temperature source  Axillary, resp. rate (!) 21, height 5\' 7"  (1.702 m), weight 75.3 kg, SpO2 100 %.    FiO2 (%):  [21 %-32 %] 21 %   Intake/Output Summary (Last 24 hours) at 08/12/2019 0815 Last data filed at 08/12/2019 0800 Gross per 24 hour  Intake 2363.08 ml  Output 2843 ml  Net -479.92 ml   Filed Weights   08/10/19 0323 08/11/19 0323 08/12/19 0442  Weight: 81.1 kg 75.1 kg 75.3 kg    Examination: General: Ill appearing adult F, bed-in-chair position, NAD HEENT: NCAT. Icteric sclera. Pink mmm. NGT in place. IJ venous access site c/d/i. Trachea midline.  Neuro: Encephalopathic. Oriented to self. PERRL. Opens eyes to command.  CV: RRR s1s2 no rgm. 2+ radial pulses   PULM:  CTA bilaterally. Symmetrical chest expansion. No accessory muscle use on RA GI: Tense with mild distension, normoactive bowel sounds.  Extremities: BUE non-pitting edema. No lower extremity edema. Symmetrical MSK bulk and tone, no obvious joint deformity.  Skin: Jaundice. Petechia on chest, abdomen. Scattered ecchymosis BUE.   Resolved Hospital Problem list     Acute Hypoxic Respiratory Failure  Assessment & Plan:   Atelectasis -Extubated 9/8 -CXR with RLL opacity, LLL atelectasis  -tracheal aspirate with yeast  P: -Supplemental O2 for SpO2 > 92% -CPT -IS/Flutter  -BDs  -Bed-in-chair position as able -PRN CXR. No utility in trending CXR at this time   Septic shock 2/2 klebsiella bacteremia, klebsiella and e. Coli UTI -Arterial line dc 9/9 AM. Do not see great utility in replacing arterial line especially in setting  of DIC.  P: -Cuff pressure MAP goal > 60 -Titrate NE for MAP > 60, wean as able. May be slow progression now that CRRT UF net (-) - Cont midodrine  - Cont rocephin   Acute metabolic encephalopathy -in setting of hepatic failure, uremia, CNS depressing medications, septic shock  P: -Continue abx, lactulose, CRRT as below -Maintain off sedation -RN delirium precautions bundle -Advance mobility efforts  as able   AKI with uremia  -acidosis improved  -remains anuric  P: -CRRT per nephrology -Trend renal function -foley dc, bladder scan per unit protocol   Alcoholic hepatitis Shock Liver-- transaminitis improving  -GI signed off 9/5, following PRN P: -Trend coags as below -Cont prednisolone -Continue rifaxamin  -Continue lactulose   DIC -in setting of hepatic failure, septic shock  -9/8: Plt 47, + schistocytes, Fibrinogen 163, PT 21.6, aPTT 50, INR 1.9 P: -Features of DIC, no active bleeding  -Trend CBC, monitor for s/sx bleeding -If bleeding occurs will need balanced resuscitation  -SCDs no chemical VTE ppx   Malnutrition, severe EtOH abuse P: -PPI  -EN per RDN  -Multivitamins, B vitamins    Best practice:  Diet: EN  Pain/Anxiety/Delirium protocol (if indicated): none VAP protocol (if indicated):none  DVT prophylaxis: SCD's in setting of DIC GI prophylaxis: PPI  Glucose control: SSI, sensitive scale Mobility: Bed rest  Code Status: LCB  Family Communication: Son Merry Proud is primary contact.  Disposition: ICU   Labs   CBC: Recent Labs  Lab 08/05/19 1010  08/05/19 1513  08/08/19 0303 08/09/19 0232 08/09/19 1311 08/10/19 0226 08/11/19 0254 08/12/19 0444  WBC 53.8*  --  49.1*   < > 47.7* 57.6*  --  43.9* 37.6* 38.3*  NEUTROABS 50.6*  --  42.5*  --   --   --   --   --   --   --   HGB 8.1*   < > 7.3*   < > 7.5* 5.6* 10.8* 11.2* 9.9* 10.1*  HCT 23.0*   < > 21.0*   < > 21.7* 16.9* 30.4* 31.6* 28.2* 29.6*  MCV 109.0*  --  108.8*   < > 108.5* 112.7*  --  99.4 100.7* 103.1*  PLT 145*  --  111*   < > 51* 50*  --  57* 48*  47* 53*   < > = values in this interval not displayed.    Basic Metabolic Panel: Recent Labs  Lab 08/08/19 0303  08/09/19 0232  08/10/19 0226 08/10/19 1700 08/11/19 0254 08/11/19 1836 08/12/19 0444  NA 137   < > 139   < > 137  136 134* 137 135 137  K 3.9   < > 3.8   < > 4.0  3.9 3.6 4.1 3.7 3.7  CL 101   < > 105   < > 104  104 102  105 103 104  CO2 23   < > 24   < > 24  23 21* 25 23 23   GLUCOSE 173*   < > 194*   < > 222*  220* 224* 154* 174* 170*  BUN 13   < > 16   < > 18  19 20 20  21* 20  CREATININE 1.19*   < > 1.03*   < > 0.97  1.01* 0.92 0.92 0.88 0.81  CALCIUM 7.3*   < > 7.2*   < > 7.4*  7.4* 7.2* 6.5* 7.3* 7.2*  MG 2.4  --  2.5*  --  2.5*  --  2.5*  --  2.5*  PHOS 3.1   < > 2.2*   < > 3.0  2.9 2.5 2.5 2.6 2.3*   < > = values in this interval not displayed.   GFR: Estimated Creatinine Clearance: 81.2 mL/min (by C-G formula based on SCr of 0.81 mg/dL). Recent Labs  Lab 08/05/19 1513  08/06/19 0948 08/06/19 2342 08/07/19 0248 08/08/19 0303 08/09/19 0232 08/10/19 0226 08/11/19 0254 08/12/19 0444  PROCALCITON  --   --   --   --  3.71 2.88  --   --   --   --   WBC 49.1*   < >  --   --  44.7* 47.7* 57.6* 43.9* 37.6* 38.3*  LATICACIDVEN 10.9*  --  9.4* 4.8*  --   --   --   --   --   --    < > = values in this interval not displayed.    Liver Function Tests: Recent Labs  Lab 08/08/19 0303  08/09/19 0232  08/10/19 0226 08/10/19 1049 08/10/19 1700 08/11/19 0254 08/11/19 1836 08/12/19 0444  AST 1,158*  --  574*  --  340* 282*  --   --   --  182*  ALT 298*  --  245*  --  213* 195*  --   --   --  131*  ALKPHOS 314*  --  296*  --  322* 321*  --   --   --  289*  BILITOT 40.0*  --  38.4*  --  41.8* 34.6*  --   --   --  36.2*  PROT 5.8*  --  5.4*  --  5.6* 5.8*  --   --   --  5.0*  ALBUMIN 2.6*   < > 2.2*  2.2*   < > 2.3*  2.2* 2.2* 2.0* 1.9* 1.8* 1.8*   < > = values in this interval not displayed.   Recent Labs  Lab 08/05/19 2203  LIPASE 104*   Recent Labs  Lab 08/07/19 0248 08/11/19 0254  AMMONIA 85* 99*    ABG    Component Value Date/Time   PHART 7.403 08/06/2019 1133   PCO2ART 30.1 (L) 08/06/2019 1133   PO2ART 124.0 (H) 08/06/2019 1133   HCO3 18.9 (L) 08/06/2019 1133   TCO2 20 (L) 08/06/2019 1133   ACIDBASEDEF 5.0 (H) 08/06/2019 1133   O2SAT 99.0 08/06/2019 1133      Coagulation Profile: Recent Labs  Lab 08/07/19 0248 08/08/19 0303 08/09/19 0232 08/10/19 0226 08/11/19 0254  INR 3.3* 2.2* 1.8* 1.7* 1.9*    Cardiac Enzymes: Recent Labs  Lab 08/05/19 1010  CKTOTAL 115    HbA1C: Hgb A1c MFr Bld  Date/Time Value Ref Range Status  08/11/2019 11:05 PM 4.7 (L) 4.8 - 5.6 % Final    Comment:    (NOTE) Pre diabetes:          5.7%-6.4% Diabetes:              >6.4% Glycemic control for   <7.0% adults with diabetes   02/27/2016 09:30 AM 5.3 <5.7 % Final    Comment:      For the purpose of screening for the presence of diabetes:   <5.7%       Consistent with the absence of diabetes 5.7-6.4 %   Consistent with increased risk for diabetes (prediabetes) >=6.5 %     Consistent with diabetes   This assay result is consistent with a decreased risk of diabetes.  Currently, no consensus exists regarding use of hemoglobin A1c for diagnosis of diabetes in children.   According to American Diabetes Association (ADA) guidelines, hemoglobin A1c <7.0% represents optimal control in non-pregnant diabetic patients. Different metrics may apply to specific patient populations. Standards of Medical Care in Diabetes (ADA).       CBG: Recent Labs  Lab 08/11/19 1625 08/11/19 2011 08/11/19 2351 08/12/19 0427 08/12/19 0739  GLUCAP 154* 171* 185* 130* 156*      CRITICAL CARE  Total critical care time: 40 minutes  Critical care time was exclusive of separately billable procedures and treating other patients. Critical care was necessary to treat or prevent imminent or life-threatening deterioration.  Critical care was time spent personally by me on the following activities: development of treatment plan with patient and/or surrogate as well as nursing, discussions with consultants, evaluation of patient's response to treatment, examination of patient, obtaining history from patient or surrogate, ordering and performing treatments and interventions,  ordering and review of laboratory studies, ordering and review of radiographic studies, pulse oximetry and re-evaluation of patient's condition.  Eliseo Gum MSN, AGACNP-BC [place] [id number] If no answer, [phone] 08/12/2019, 8:15 AM

## 2019-08-12 NOTE — Progress Notes (Signed)
Assisted tele visit to patient with sister and niece.  Maryelizabeth Rowan, RN

## 2019-08-12 NOTE — Progress Notes (Signed)
PT Cancellation Note  Patient Details Name: Victoria Osborne MRN: [id number] DOB: [birth year]   Cancelled Treatment:    Reason Eval/Treat Not Completed: Patient not medically ready  Patient hypotensive with Levophed increasing in dose. Per RN, pt is not following commands and not appropriate for bed level evaluation.  Will follow-up another date and if pt not improving, will sign-off and request new order when she is appropriate.    Barry Brunner, PT     Rexanne Mano 08/12/2019, 2:40 PM

## 2019-08-12 NOTE — Progress Notes (Signed)
Arterial line no longer drawing back blood, picking up appropriate waveform and blood pressure . Attempted to get it working again but unsuccessful. E-link MD notified.

## 2019-08-12 NOTE — Progress Notes (Signed)
Patient has been extubated.  No evidence of GI bleeding.  Still anuric on CRRT.  Pressors being weaned, but still on Levophed.  From the hepatic standpoint, patient has been transitioned to prednisolone orally.  The key parameters concerning her hepatic status, namely INR and bilirubin, are essentially stable over the past several days.  No real trend toward improvement or worsening.  Meanwhile, her transaminases continue to decline.  This could be resolution of "shock liver" rather than resolution of alcoholic pancreatitis, since they are still moderately elevated.  Ultrasound reportedly shows just trace ascites (not mentioned on report).  On exam, the abdomen is somewhat adipose and full without flank tympany, so by exam it would seem possible that she has more ascites than the ultrasound showed.  However, it should be kept in mind that this was a limited right upper quadrant ultrasound, so there may well be ascites and parts of the abdomen that were simply not checked by ultrasound.  In other words, I am not convinced that the patient is improving from the liver standpoint, but at least she does not seem to be worsening, either.  No new suggestions at this time.  We will continue to follow periodically to monitor her status but would welcome your call at any time if you would like to discuss her case or if earlier input is needed.  Cleotis Nipper, M.D. Pager [phone] If no answer or after 5 PM call [phone]

## 2019-08-12 NOTE — Procedures (Signed)
Admit: 08/23/2019 LOS: 82  58 year old F chronic alcohol abuse, alcoholic hepatitis, dialysis dependent AKI, Klebsiella bacteremia and UTI, VDRF  Current CRRT Prescription: Start Date: 08/07/19 Catheter: R IJ Temp HD cath placed 9/3 CCM BFR: 300 Pre Blood Pump: 300 4K DFR: 1500 4K  Replacement Rate: 400 4K Goal UF: 35m/hr net neg Anticoagulation: none Clotting: none  S: No interval events Continues to require low-dose norepinephrine Anuric K 3.7, P 2.3 Encephalopathic, delirious  O: 09/09 0701 - 09/10 0700 In: 2292.5 [I.V.:482.6; NG/GT:1710; IV Piggyback:99.9] Out: 2717 [Urine:17; Stool:450]  Filed Weights   08/10/19 0323 08/11/19 0323 08/12/19 0442  Weight: 81.1 kg 75.1 kg 75.3 kg    Recent Labs  Lab 08/11/19 0254 08/11/19 1836 08/12/19 0444  NA 137 135 137  K 4.1 3.7 3.7  CL 105 103 104  CO2 _0 GLUCOSE 154* 174* 170*  BUN 20 21* 20  CREATININE 0.92 0.88 0.81  CALCIUM 6.5* 7.3* 7.2*  PHOS 2.5 2.6 2.3*   Recent Labs  Lab 08/05/19 1010  08/05/19 1513  08/10/19 0226 08/11/19 0254 08/12/19 0444  WBC 53.8*  --  49.1*   < > 43.9* 37.6* 38.3*  NEUTROABS 50.6*  --  42.5*  --   --   --   --   HGB 8.1*   < > 7.3*   < > 11.2* 9.9* 10.1*  HCT 23.0*   < > 21.0*   < > 31.6* 28.2* 29.6*  MCV 109.0*  --  108.8*   < > 99.4 100.7* 103.1*  PLT 145*  --  111*   < > 57* 48*  47* 53*   < > = values in this interval not displayed.    Scheduled Meds: . B-complex with vitamin C  1 tablet Per Tube Daily  . chlorhexidine gluconate (MEDLINE KIT)  15 mL Mouth Rinse BID  . Chlorhexidine Gluconate Cloth  6 each Topical Daily  . folic acid  1 mg Oral Daily  . insulin aspart  0-9 Units Subcutaneous Q4H  . insulin aspart  2 Units Subcutaneous Q4H  . ipratropium-albuterol  3 mL Nebulization Q6H  . lactulose  20 g Oral TID  . mouth rinse  15 mL Mouth Rinse 10 times per day  . midodrine  10 mg Per Tube TID WC  . pantoprazole (PROTONIX) IV  40 mg Intravenous Q24H  .  prednisoLONE  40 mg Per Tube Daily  . rifaximin  550 mg Per Tube BID  . thiamine  100 mg Oral Daily   Continuous Infusions: .  prismasol BGK 4/2.5 300 mL/hr at 08/12/19 0307  .  prismasol BGK 4/2.5 400 mL/hr at 08/12/19 0004  . sodium chloride 20 mL/hr at 08/12/19 0800  . cefTRIAXone (ROCEPHIN)  IV Stopped (08/11/19 2112)  . feeding supplement (VITAL AF 1.2 CAL) 65 mL/hr at 08/12/19 0800  . norepinephrine (LEVOPHED) Adult infusion 7 mcg/min (08/12/19 0800)  . prismasol BGK 4/2.5 1,500 mL/hr at 08/12/19 0651  . sodium phosphate  Dextrose 5% IVPB     PRN Meds:.heparin  ABG    Component Value Date/Time   PHART 7.403 08/06/2019 1133   PCO2ART 30.1 (L) 08/06/2019 1133   PO2ART 124.0 (H) 08/06/2019 1133   HCO3 18.9 (L) 08/06/2019 1133   TCO2 20 (L) 08/06/2019 1133   ACIDBASEDEF 5.0 (H) 08/06/2019 1133   O2SAT 99.0 08/06/2019 1133    A/P  1. Dialysis dependent anuric AKI on CRRT, stable; normal baseline GFR 2. Acute alcoholic hepatitis, severe,  on steroids; GI following 3. Shock, septic: Improving but remains on lower dose of norepinephrine, Per CCM 4. Metabolic acidosis, improved on CRRT 5. AMS 6. VDRF, extubated 9/8 successfully 7. Anemia, requiring intermittent transfusion 8. Leukocytosis, on steroids 9. Klebsiella bacteremia+UTI, E coli UTI, ceftriaxone  Continue CRRT at current settings, continue to pull fluid, 50 mL/h net negative but not at the expense of increasing pressors, will continue to follow closely.  Replete phosphorus today. not ready to transition to intermittent hemodialysis  Ryan Sanford, MD [facility]  

## 2019-08-12 NOTE — Progress Notes (Signed)
[place] Progress Note Patient Name: Victoria Osborne DOB: 18-Jun-1961 MRN: [id number]   Date of Service  08/12/2019  HPI/Events of Note  Arterial line no longer working, patient remains hypotensive.  eICU Interventions  Order placed to discontinue non-functional arterial line and replace it with a new line.        Kerry Kass Rufus Cypert 08/12/2019, 5:14 AM

## 2019-08-12 NOTE — Evaluation (Signed)
Clinical/Bedside Swallow Evaluation Patient Details  Name: Victoria Osborne MRN: [id number] Date of Birth: 14-Apr-1961  Today's Date: 08/12/2019 Time: SLP Start Time (ACUTE ONLY): [id number] SLP Stop Time (ACUTE ONLY): 0910 SLP Time Calculation (min) (ACUTE ONLY): 15 min  Past Medical History:  Past Medical History:  Diagnosis Date  . Anxiety   . Depression   . Restless leg syndrome   . Substance abuse [facility])    Past Surgical History:  Past Surgical History:  Procedure Laterality Date  . CESAREAN SECTION     HPI:  58yo female admitted 08/03/2019 with AMS, related to alcoholic hepatitis, possibly septic shock, found down at home. PMH: alcoholism.anxiety, depression, RLS, substance abuse. CXR =Persistent but improved infiltrate in the right perihilar region.   Assessment / Plan / Recommendation Clinical Impression  Minimal improvement since yesterday. Pt is slightly more alert, but still requires cues to follow simple commands. Oral care completed with suction. Pt made no attempt to manipulate oral swab. Reflexive swallow noted during oral care, however, no volitional swallow. No PO trials given at this time due to lack of oral response and high risk of aspiration. Pt currently receiving NGT feeds. SLP will continue to follow at bedside to assess readiness for po intake.   SLP Visit Diagnosis: Dysphagia, unspecified (R13.10)    Aspiration Risk  Severe aspiration risk;Risk for inadequate nutrition/hydration    Diet Recommendation NPO   Medication Administration: Via alternative means    Other  Recommendations Oral Care Recommendations: Oral care QID;Staff/trained caregiver to provide oral care Other Recommendations: Have oral suction available   Follow up Recommendations Other (comment)(to be assessed)      Frequency and Duration min 2x/week  1 week;2 weeks       Prognosis Prognosis for Safe Diet Advancement: Fair Barriers to Reach Goals: Severity of deficits      Swallow  Study   General Date of Onset: 08/11/2019 HPI: 58yo female admitted 08/19/2019 with AMS, related to alcoholic hepatitis, possibly septic shock, found down at home. PMH: alcoholism.anxiety, depression, RLS, substance abuse. CXR =Persistent but improved infiltrate in the right perihilar region. Type of Study: Bedside Swallow Evaluation Previous Swallow Assessment: no previous ST intervention Diet Prior to this Study: NPO;NG Tube Temperature Spikes Noted: No Respiratory Status: Room air History of Recent Intubation: Yes Length of Intubations (days): 5 days Date extubated: 08/10/19 Behavior/Cognition: Doesn't follow directions;Requires cueing(awake, slightly more responsive/interactive today) Oral Cavity Assessment: Within Functional Limits Oral Care Completed by SLP: Yes Oral Cavity - Dentition: Missing dentition Self-Feeding Abilities: Total assist Patient Positioning: Upright in bed Baseline Vocal Quality: Normal Volitional Cough: Cognitively unable to elicit Volitional Swallow: (reflexive swallow elicited during oral care with suction)    Oral/Motor/Sensory Function Overall Oral Motor/Sensory Function: Other (comment)(unable to assess)   Ice Chips Ice chips: Not tested   Thin Liquid Thin Liquid: Not tested    Nectar Thick Nectar Thick Liquid: Not tested   Honey Thick Honey Thick Liquid: Not tested   Puree Puree: Not tested   Solid     Solid: Not tested      B. Quentin Ore, [facility], [place] Speech Language Pathologist [phone]  Shonna Chock 08/12/2019,9:16 AM

## 2019-08-12 NOTE — Progress Notes (Signed)
Assisted tele visit to patient with boyfriend.  Maryelizabeth Rowan, RN

## 2019-08-13 ENCOUNTER — Encounter (HOSPITAL_COMMUNITY): Payer: Self-pay

## 2019-08-13 LAB — HEPATIC FUNCTION PANEL
ALT: 123 U/L — ABNORMAL HIGH (ref 0–44)
AST: 180 U/L — ABNORMAL HIGH (ref 15–41)
Albumin: 1.7 g/dL — ABNORMAL LOW (ref 3.5–5.0)
Alkaline Phosphatase: 284 U/L — ABNORMAL HIGH (ref 38–126)
Bilirubin, Direct: 24.9 mg/dL — ABNORMAL HIGH (ref 0.0–0.2)
Total Bilirubin: 37.3 mg/dL (ref 0.3–1.2)
Total Protein: 4.7 g/dL — ABNORMAL LOW (ref 6.5–8.1)

## 2019-08-13 LAB — RENAL FUNCTION PANEL
Albumin: 1.7 g/dL — ABNORMAL LOW (ref 3.5–5.0)
Albumin: 2.4 g/dL — ABNORMAL LOW (ref 3.5–5.0)
Anion gap: 13 (ref 5–15)
Anion gap: 12 (ref 5–15)
BUN: 17 mg/dL (ref 6–20)
BUN: 17 mg/dL (ref 6–20)
CO2: 21 mmol/L — ABNORMAL LOW (ref 22–32)
CO2: 21 mmol/L — ABNORMAL LOW (ref 22–32)
Calcium: 7.3 mg/dL — ABNORMAL LOW (ref 8.9–10.3)
Calcium: 7.5 mg/dL — ABNORMAL LOW (ref 8.9–10.3)
Chloride: 105 mmol/L (ref 98–111)
Chloride: 105 mmol/L (ref 98–111)
Creatinine, Ser: 0.78 mg/dL (ref 0.44–1.00)
Creatinine, Ser: 0.86 mg/dL (ref 0.44–1.00)
GFR calc Af Amer: >60 mL/min (ref >60)
GFR calc Af Amer: >60 mL/min (ref >60)
GFR calc non Af Amer: >60 mL/min (ref >60)
GFR calc non Af Amer: >60 mL/min (ref >60)
Glucose, Bld: 182 mg/dL — ABNORMAL HIGH (ref 70–99)
Glucose, Bld: 186 mg/dL — ABNORMAL HIGH (ref 70–99)
Phosphorus: 3.1 mg/dL (ref 2.5–4.6)
Phosphorus: 3 mg/dL (ref 2.5–4.6)
Potassium: 4 mmol/L (ref 3.5–5.1)
Potassium: 4.2 mmol/L (ref 3.5–5.1)
Sodium: 139 mmol/L (ref 135–145)
Sodium: 138 mmol/L (ref 135–145)

## 2019-08-13 LAB — GLUCOSE, CAPILLARY
Glucose-Capillary: 106 mg/dL — ABNORMAL HIGH (ref 70–99)
Glucose-Capillary: 146 mg/dL — ABNORMAL HIGH (ref 70–99)
Glucose-Capillary: 150 mg/dL — ABNORMAL HIGH (ref 70–99)
Glucose-Capillary: 157 mg/dL — ABNORMAL HIGH (ref 70–99)
Glucose-Capillary: 160 mg/dL — ABNORMAL HIGH (ref 70–99)
Glucose-Capillary: 175 mg/dL — ABNORMAL HIGH (ref 70–99)
Glucose-Capillary: 190 mg/dL — ABNORMAL HIGH (ref 70–99)

## 2019-08-13 LAB — CBC
HCT: 31.7 % — ABNORMAL LOW (ref 36.0–46.0)
Hemoglobin: 10.7 g/dL — ABNORMAL LOW (ref 12.0–15.0)
MCH: 35 pg — ABNORMAL HIGH (ref 26.0–34.0)
MCHC: 33.8 g/dL (ref 30.0–36.0)
MCV: 103.6 fL — ABNORMAL HIGH (ref 80.0–100.0)
Platelets: 69 10*3/uL — ABNORMAL LOW (ref 150–400)
RBC: 3.06 MIL/uL — ABNORMAL LOW (ref 3.87–5.11)
RDW: 25.9 % — ABNORMAL HIGH (ref 11.5–15.5)
WBC: 47 10*3/uL — ABNORMAL HIGH (ref 4.0–10.5)
nRBC: 2.8 % — ABNORMAL HIGH (ref 0.0–0.2)

## 2019-08-13 LAB — BLOOD CULTURE ID PANEL (REFLEXED)

## 2019-08-13 LAB — MAGNESIUM: Magnesium: 2.3 mg/dL (ref 1.7–2.4)

## 2019-08-13 MED ORDER — IPRATROPIUM-ALBUTEROL 0.5-2.5 (3) MG/3ML IN SOLN
3.0000 mL | Freq: Two times a day (BID) | RESPIRATORY_TRACT | Status: DC
  Administered 2019-08-13 – 2019-08-14 (×3): 3 mL via RESPIRATORY_TRACT
  Filled 2019-08-13 (×4): qty 3

## 2019-08-13 MED ORDER — LACTULOSE 10 GM/15ML PO SOLN
20.0000 g | Freq: Three times a day (TID) | ORAL | Status: DC
  Administered 2019-08-13 – 2019-08-14 (×6): 20 g
  Filled 2019-08-13 (×5): qty 30

## 2019-08-13 MED ORDER — CHLORHEXIDINE GLUCONATE CLOTH 2 % EX PADS: 6.0000 | MEDICATED_PAD | Freq: Every day | CUTANEOUS | Status: DC

## 2019-08-13 MED ORDER — FOLIC ACID 1 MG PO TABS
1.0000 mg | ORAL_TABLET | Freq: Every day | ORAL | Status: DC
  Administered 2019-08-13 – 2019-08-14 (×2): 1 mg
  Filled 2019-08-13: qty 1

## 2019-08-13 MED ORDER — ALBUMIN HUMAN 25 % IV SOLN
25.0000 g | Freq: Four times a day (QID) | INTRAVENOUS | Status: AC
  Administered 2019-08-13 – 2019-08-14 (×4): 25 g via INTRAVENOUS
  Filled 2019-08-13 (×2): qty 50
  Filled 2019-08-13: qty 150
  Filled 2019-08-13: qty 50
  Filled 2019-08-13: qty 100

## 2019-08-13 MED ORDER — VITAL AF 1.2 CAL PO LIQD
1000.0000 mL | ORAL | Status: DC
  Administered 2019-08-13 – 2019-08-14 (×3): 1000 mL
  Filled 2019-08-13 (×2): qty 1000

## 2019-08-13 MED ORDER — SODIUM CHLORIDE 0.9 % IV SOLN
2.0000 g | Freq: Two times a day (BID) | INTRAVENOUS | Status: DC
  Administered 2019-08-13 – 2019-08-14 (×4): 2 g via INTRAVENOUS
  Filled 2019-08-13 (×4): qty 2

## 2019-08-13 MED ORDER — VITAMIN B-1 100 MG PO TABS
100.0000 mg | ORAL_TABLET | Freq: Every day | ORAL | Status: DC
  Administered 2019-08-13 – 2019-08-14 (×2): 100 mg
  Filled 2019-08-13: qty 1

## 2019-08-13 NOTE — Procedures (Signed)
I evaluated the patient while on CRRT and discussed with bedside RN.  Dialysis is running without issue. 

## 2019-08-13 NOTE — Procedures (Signed)
Cortrak  Person Inserting Tube:  Esaw Dace, RD Tube Type:  Cortrak - 43 inches Tube Location:  Left nare Secured by: Bridle Technique Used to Measure Tube Placement:  Documented cm marking at nare/ corner of mouth Cortrak Secured At:  82 cm    Cortrak Tube Team Note:  Consult received to place a Cortrak feeding tube.    No x-ray is required. RN may begin using tube. Placement is at least gastric, may be post-pyloric. PP placement not required for this patient  If the tube becomes dislodged please keep the tube and contact the Cortrak team at [email or web address] (password [email or web address]) for replacement.  If after hours and replacement cannot be delayed, place a NG tube and confirm placement with an abdominal x-ray.    [employer] MS, RDN, LDN, CNSC [phone] Pager  [phone] Weekend/On-Call Pager

## 2019-08-13 NOTE — Progress Notes (Addendum)
Nutrition Follow-up RD working remotely.  DOCUMENTATION CODES:   Non-severe (moderate) malnutrition in context of chronic illness  INTERVENTION:    Increase Vital AF 1.2 to new goal rate of 70 ml/h (1680 ml per day)   Provides 2016 kcal, 126 gm protein, 1362 ml free water daily  NUTRITION DIAGNOSIS:   Moderate Malnutrition related to chronic illness as evidenced by mild fat depletion, mild muscle depletion.  Ongoing  GOAL:   Patient will meet greater than or equal to 90% of their needs  Met with TF  MONITOR:   TF tolerance, Labs, I & O's, Diet advancement  ASSESSMENT:   58 yo female admitted with severe alcoholic hepatitis, acute respiratory failure with inability to protect airway requiring intubation, shock requiring pressors, AKI with acidosis,  PMH EtOH abuse, anxiety, depression.  Patient was extubated on 9/8. NGT placed 9/9 for continued enteral nutrition. Receiving Vital AF 1.2 at 65 ml/h, tolerating well.  Remains encephalopathic with guarded prognosis.   SLP following for ability to begin PO diet. Remains NPO with high risk for aspiration, decreased alertness, and lack of oral response.   Remains on CRRT, anuric. Receiving low dose Levophed infusion.   Ultrasound 9/10 showed trace peri-hepatic and peri-splenic ascites, not amenable to paracentesis. Liver function is not improving, but also not getting worse per MD notes.    Labs reviewed.  CBG's: [phone]  Medications reviewed and include B-complex with vitamin C, folic acid, novolog, lactulose, prednisone, thiamine, levophed.  Per RN documentation, patient has moderate generalized edema and mild-moderate edema to BUE.   Weight trending back down towards admission weight.  I/O + 7.9 L since admission  Diet Order:   Diet Order            Diet NPO time specified  Diet effective now              EDUCATION NEEDS:   Not appropriate for education at this time  Skin:  Skin Assessment: Skin  Integrity Issues: Skin Integrity Issues:: Stage I, DTI DTI: heel Stage I: sacrum  Last BM:  9/11 rectal tube, brown  Height:   Ht Readings from Last 1 Encounters:  08/19/2019 _0  (1.702 m)    Weight:   Wt Readings from Last 1 Encounters:  08/13/19 73.5 kg    Ideal Body Weight:  61.4 kg  BMI:  Body mass index is 25.38 kg/m.  Estimated Nutritional Needs:   Kcal:  1950-2200  Protein:  110-135 gm  Fluid:  1.9-2 L    Molli Barrows, RD, LDN, CNSC Pager [phone] After Hours Pager [phone]

## 2019-08-13 NOTE — Evaluation (Signed)
Physical Therapy Evaluation Patient Details Name: Victoria Osborne MRN: [id number] DOB: March 08, 1961 Today's Date: 08/13/2019   History of Present Illness  58 y/o F admitted 9/2 with reports of altered mental status. The patient was found at home lying on the floor unresponsive, had noted visible jaundice the week preceding admit. Work up notable for GNR UTI, bacteremia (klebsiella on BCID).  Pt requiring mechanical ventilation, vasopressors and CVVHD, intubated 08/05/19 extubated on 08/10/19. PMHx includes anxiety, depression, restless leg syndrome, and substance abuse.   Clinical Impression  Pt is arousable, but not following commands.  EOB mobility or even higher chair mode is limited by hypotension.  4 extremity ROM and repositioning did result in a mildly higher BP.  Education provided to her on/off boyfriend, Manoli, on extremity ROM and retrograde massage of bil UEs.  Manoli reports she lives in a shared housing situation where she rents a room, sleeps on an air mattress and uses a shared bathroom.  To get to her room she has to go up two flights of stairs.  He reports most of her family lives in [place] and if she recovers, the plan would be for her to go to [facility] and be closer to them.  Sister, Merwyn Katos, was present via video during the session.  PT to follow acutely for deficits listed below.      Follow Up Recommendations SNF;Supervision/Assistance - 24 hour    Equipment Recommendations  Wheelchair (measurements PT);Wheelchair cushion (measurements PT);Hospital bed;Other (comment)(hoyer lift)    Recommendations for Other Services   NA    Precautions / Restrictions Precautions Precautions: Fall;Other (comment) Precaution Comments: NG, CRRT, wrist restraints, bil mitts, hypotension Restrictions Weight Bearing Restrictions: No      Mobility  Bed Mobility               General bed mobility comments: NT, pt remains hypotensive, mildly more elevated after stimulation and ROM.  Not able to  even get her in higher chair mode for fear her BP would drop.  HR and O2 sats stable on RA.                Pertinent Vitals/Pain Pain Assessment: Faces Faces Pain Scale: Hurts even more Pain Location: generalized with 4 extremity ROM Pain Descriptors / Indicators: Moaning Pain Intervention(s): Limited activity within patient's tolerance;Monitored during session;Repositioned    Home Living Family/patient expects to be discharged to:: Private residence Living Arrangements: Alone Available Help at Discharge: Family;Friend(s);Available PRN/intermittently Type of Home: Apartment(rented room, shared bathroom) Home Access: Stairs to enter(2 flights)   Entrance Stairs-Number of Steps: flight(2 flights) Home Layout: One level Home Equipment: None Additional Comments: Per boyfriend, Manoli, the plan, if she recovers is for her to rehab in [place] to be closer to her sister, son, and parents.     Prior Function Level of Independence: Independent         Comments: intermittently working (difficulty with ETOH abuse and keeping a job), drives        Extremity/Trunk Assessment   Upper Extremity Assessment Upper Extremity Assessment: Generalized weakness(bil UE pitting edema )    Lower Extremity Assessment Lower Extremity Assessment: Generalized weakness;RLE deficits/detail;LLE deficits/detail RLE Deficits / Details: bil LE muscle wasting, spontaneous movement of bil legs, but not to command.  ankles mildly tight, but can make it to neutral DF, hips and knee ROM WNL, strength difficult to assess due to no command following.  Despite bil UE edema, LEs are edema free.   LLE Deficits / Details: bil LE  muscle wasting, spontaneous movement of bil legs, but not to command.  ankles mildly tight, but can make it to neutral DF, hips and knee ROM WNL, strength difficult to assess due to no command following.  Despite bil UE edema, LEs are edema free.      Cervical / Trunk Assessment Cervical /  Trunk Assessment: Normal  Communication   Communication: Other (comment)(garbled speech, some clear phrases/words)  Cognition Arousal/Alertness: Lethargic Behavior During Therapy: Flat affect Overall Cognitive Status: Impaired/Different from baseline Area of Impairment: Attention;Orientation;Memory;Following commands;Safety/judgement;Awareness;Problem solving                 Orientation Level: Disoriented to;Place;Time;Situation(said her sister's name who is video-ing in) Current Attention Level: Focused(moments of arousal) Memory: Decreased recall of precautions;Decreased short-term memory Following Commands: Follows one step commands inconsistently(no command following) Safety/Judgement: Decreased awareness of safety;Decreased awareness of deficits Awareness: Intellectual Problem Solving: Decreased initiation;Difficulty sequencing;Requires verbal cues;Requires tactile cues General Comments: No command following today, spontaneous moving, some resistance when ROM was uncomfortable, arouses briefly to calling her name, recognized her sister's voice, opens eyes briefly.          Exercises General Exercises - Upper Extremity Shoulder Flexion: PROM;Both;10 reps;Supine Shoulder ABduction: PROM;Both;10 reps;Supine Elbow Flexion: PROM;Both;10 reps;Supine Elbow Extension: PROM;Both;10 reps;Supine Wrist Extension: PROM;Both;10 reps;Supine General Exercises - Lower Extremity Ankle Circles/Pumps: PROM;Both;10 reps;Supine Heel Slides: PROM;Both;10 reps;Supine Hip ABduction/ADduction: PROM;Both;10 reps;Supine Other Exercises Other Exercises: Taught boyfriend HEP exercises and he was able to preform those exercises on the opposite side of therapist.  Other Exercises: retrograde massage to bil UEs with arms position in edema reducing position at end of session.    Assessment/Plan    PT Assessment Patient needs continued PT services  PT Problem List Decreased strength;Decreased activity  tolerance;Decreased balance;Decreased mobility;Decreased range of motion;Decreased cognition;Decreased knowledge of use of DME;Decreased safety awareness;Decreased knowledge of precautions;Cardiopulmonary status limiting activity       PT Treatment Interventions DME instruction;Gait training;Stair training;Functional mobility training;Therapeutic activities;Therapeutic exercise;Balance training;Cognitive remediation;Patient/family education;Neuromuscular re-education;Wheelchair mobility training    PT Goals (Current goals can be found in the Care Plan section)  Acute Rehab PT Goals Patient Stated Goal: none stated PT Goal Formulation: With patient/family Time For Goal Achievement: 08/27/19 Potential to Achieve Goals: Good    Frequency Min 2X/week   Barriers to discharge Inaccessible home environment;Decreased caregiver support         AM-PAC PT "6 Clicks" Mobility  Outcome Measure Help needed turning from your back to your side while in a flat bed without using bedrails?: Total Help needed moving from lying on your back to sitting on the side of a flat bed without using bedrails?: Total Help needed moving to and from a bed to a chair (including a wheelchair)?: Total Help needed standing up from a chair using your arms (e.g., wheelchair or bedside chair)?: Total Help needed to walk in hospital room?: Total Help needed climbing 3-5 steps with a railing? : Total 6 Click Score: 6    End of Session   Activity Tolerance: Patient limited by pain;Patient limited by lethargy Patient left: in bed;with call bell/phone within reach;with restraints reapplied Nurse Communication: Mobility status PT Visit Diagnosis: Muscle weakness (generalized) (M62.81);Difficulty in walking, not elsewhere classified (R26.2)    Time: [id number] PT Time Calculation (min) (ACUTE ONLY): 40 min   Charges:       Wells Guiles B. Demarrius Guerrero, PT, DPT  Acute Rehabilitation [phone] pager [phone]) [phone]  office  @ [facility]: [phone]   PT Evaluation $  PT Eval Moderate Complexity: 1 Mod PT Treatments $Therapeutic Exercise: 23-37 mins        08/13/2019, 5:20 PM

## 2019-08-13 NOTE — Progress Notes (Signed)
CVP -4 CCM and Nephrology notified.

## 2019-08-13 NOTE — Progress Notes (Signed)
NAME:  Victoria Osborne, MRN:  [id number], DOB:  [birth year], LOS: 62 ADMISSION DATE:  08/25/2019, CONSULTATION DATE:  9/2 REFERRING MD:  ER, CHIEF COMPLAINT:  AMS    Brief History   58 y/o F admitted 9/2 with reports of altered mental status.  The patient was found at home lying on the floor unresponsive with 9 empty bottles of vodka.  He had noted visible jaundice the week preceding admit.  Work up notable for GNR UTI, bacteremia (klebsiella on BCID).  Requiring mechanical ventilation, vasopressors and CVVHD.   Past Medical History  Anxiety  Depression  ETOH Abuse  Significant Hospital Events   9/02 Admit  9/03 Arrived to ICU obtunded, tachypneic > intubated 9/05 CVVHD, fentanyl gtt (19mcg), levophed 26 mcg, neosynephrine, vaso gtt's 9/06 Levo down to 22, vaso, fent increased to 200 mcg 9/8 weaning sedation, continuing CRRT. Interval increase in RLL opacity. Interval decrease in WBC, transaminases. Extubated. 9/9 Off Vaso, weaning NE. Remains on CRRT, remains extubated.   Consults:  PCCM   Procedures:  ETT 9/3 > 9/8 Central line 9/3 >>  Aline 9/3 >>   Significant Diagnostic Tests:   Abd [place] 9/2> hepatic steatosis, cholelithiasis with associated gallbladder wall thickening.  Renal US 9/4> no hydronephrosis or renal mass CXR 9/8> Increased RLL opacity CXR 9/9> Interval removal of ETT. Mild interval improvement to R sided opacity. L sided atelectasis   Micro Data:  COVID 9/2 >> negative  BCID 9/2 >> klebsiella  BCx2 9/2 >> Klebsiella pneumoniae >> S-ceftriaxone UC 9/3 >> Klebsiella pneumoniae >> S-ceftriaxone  >> E-Coli >> S-ceftriaxone Tracheal aspirate 9/8>> Moderate yeast, rare GNR  Antimicrobials:  Vanco 9/2 >> 9/4 Cefepime 9/2 >> 9/4  Ceftriaxone 9/4 >> 9/11   Interim history/subjective:  Remains on levophed Still encephalopathic  Objective   Blood pressure (!) 76/60, pulse 83, temperature (!) 96.7 F (35.9 C), temperature source Axillary, resp. rate (!) 24,  height 5\' 7"  (1.702 m), weight 73.5 kg, SpO2 100 %.        Intake/Output Summary (Last 24 hours) at 08/13/2019 0849 Last data filed at 08/13/2019 0800 Gross per 24 hour  Intake 2294.3 ml  Output 3520 ml  Net -1225.7 ml   Filed Weights   08/11/19 0323 08/12/19 0442 08/13/19 0232  Weight: 75.1 kg 75.3 kg 73.5 kg    Examination: GEN: jaundiced ill appearing woman HEENT: MM dry, trachea midline, scleral icterus CV: RRR, holosystolic murmur over apex PULM: Clear bilaterally without accessory muscle use GI: protuberant but soft bedside US yesterday showing trace peri-hepatic and peri-splenic ascites not amenable to paracentesis EXT: no edema NEURO: moves all 4 ext but not to command PSYCH: occasional sentences that make sense (ie "don't do that"), most of the time just moans SKIN: jaundice   Resolved Hospital Problem list     Acute Hypoxic Respiratory Failure  Assessment & Plan:  # Alcoholic and shock liver- in a holding pattern, not really getting better but not decompensating # Septic shock 2/2 klebsiella bacteremia, klebsiella and e. Coli UTI- s/p 7 days ceftriaxone; however, WBCs up a bit as is pressor requirements, tracheal aspirate showing burkholderia.   # Encephalopathy- hepatic and ICU-related # Acute renal failure- on CRRT # Low grade DIC- no indications to transfuse or trend, common in liver disease # Severe protein calorie malnutrition- present on admission  - Prednisolone x 28 day total course - Continue levophed, midodrine, wean to maintain MAPs > 60 - Given higher white count and the burkholderia, will do  7 day course of cefepime - Continue rifaxamin and lactulose, redirect as able - CPT, IS, flutter - CRRT net even to negative, appreciate nephrology help - Trial of albumin 25% 100g over 24h - Guarded prognosis remains, patient is okay for reintubation but not CPR.  If reintubation occurs, this is to allow family to visit prior to her passing.  Best practice:   Diet: EN  Pain/Anxiety/Delirium protocol (if indicated): none VAP protocol (if indicated):none  DVT prophylaxis: SCD GI prophylaxis: PPI  Glucose control: SSI, sensitive scale Mobility: maybe up to chair later today Code Status: limited, see above  Family Communication: Son Merry Proud is primary contact. He was updated at length yesterday, no real big changes to plan today Disposition: ICU    Total critical care time: 42 minutes

## 2019-08-13 NOTE — Progress Notes (Signed)
Victoria Osborne    NEPHROLOGY PROGRESS NOTE  SUBJECTIVE: Patient seen and examined.  Confused, unable to provide ROS.     OBJECTIVE:  Vitals:   08/13/19 1345 08/13/19 1600  BP: (!) 78/55   Pulse: 81   Resp: (!) 23   Temp:  (!) 97.4 F (36.3 C)  SpO2: 98%     Intake/Output Summary (Last 24 hours) at 08/13/2019 1719 Last data filed at 08/13/2019 1600 Gross per 24 hour  Intake 2121.55 ml  Output 2453 ml  Net -331.45 ml      General:  AAOx3 NAD HEENT: MMM Victoria Osborne AT icteric sclera Neck:  No JVD, no adenopathy CV:  Heart RRR  Lungs:  L/S CTA bilaterally Abd:  Distended, with normal BS GU:  Bladder non-palpable Extremities:  No LE edema, positive dependent edema at the hip. Skin:  No skin rash  MEDICATIONS:  . B-complex with vitamin C  1 tablet Per Tube Daily  . Chlorhexidine Gluconate Cloth  6 each Topical Daily  . folic acid  1 mg Per Tube Daily  . insulin aspart  0-9 Units Subcutaneous Q4H  . insulin aspart  2 Units Subcutaneous Q4H  . ipratropium-albuterol  3 mL Nebulization BID  . lactulose  20 g Per Tube TID  . midodrine  10 mg Per Tube TID WC  . pantoprazole (PROTONIX) IV  40 mg Intravenous Q24H  . prednisoLONE  40 mg Per Tube Daily  . rifaximin  550 mg Per Tube BID  . thiamine  100 mg Per Tube Daily       LABS:   CBC Latest Ref Rng & Units 08/13/2019 08/12/2019 08/11/2019  WBC 4.0 - 10.5 K/uL 47.0(H) 38.3(H) -  Hemoglobin 12.0 - 15.0 g/dL 10.7(L) 10.1(L) -  Hematocrit 36.0 - 46.0 % 31.7(L) 29.6(L) -  Platelets 150 - 400 K/uL 69(L) 53(L) 48(L)    CMP Latest Ref Rng & Units 08/13/2019 08/12/2019 08/12/2019  Glucose 70 - 99 mg/dL 182(H) 204(H) 170(H)  BUN 6 - 20 mg/dL 17 17 20   Creatinine 0.44 - 1.00 mg/dL 0.78 0.77 0.81  Sodium 135 - 145 mmol/L 139 138 137  Potassium 3.5 - 5.1 mmol/L 4.0 3.5 3.7  Chloride 98 - 111 mmol/L 105 105 104  CO2 22 - 32 mmol/L 21(L) 23 23  Calcium 8.9 - 10.3 mg/dL 7.3(L) 6.9(L) 7.2(L)  Total Protein 6.5 - 8.1 g/dL  4.7(L) - 5.0(L)  Total Bilirubin 0.3 - 1.2 mg/dL 37.3(HH) - 36.2(HH)  Alkaline Phos 38 - 126 U/L 284(H) - 289(H)  AST 15 - 41 U/L 180(H) - 182(H)  ALT 0 - 44 U/L 123(H) - 131(H)    Lab Results  Component Value Date   CALCIUM 7.3 (L) 08/13/2019   CAION 0.90 (L) 08/06/2019   PHOS 3.1 08/13/2019       Component Value Date/Time   COLORURINE GREEN (A) 08/17/2019 2135   APPEARANCEUR CLOUDY (A) 08/07/2019 2135   APPEARANCEUR Hazy 11/08/2013 1615   LABSPEC 1.020 08/14/2019 2135   LABSPEC 1.004 11/08/2013 1615   PHURINE 7.5 08/05/2019 2135   GLUCOSEU >=500 (A) 08/07/2019 2135   GLUCOSEU Negative 11/08/2013 1615   HGBUR LARGE (A) 09/01/2019 2135   BILIRUBINUR LARGE (A) 08/28/2019 2135   BILIRUBINUR negative 12/10/2017 0910   BILIRUBINUR Negative 11/08/2013 1615   KETONESUR 15 (A) 08/27/2019 2135   PROTEINUR >300 (A) 08/25/2019 2135   UROBILINOGEN 0.2 12/10/2017 0910   NITRITE NEGATIVE 08/14/2019 2135   LEUKOCYTESUR LARGE (A) 08/10/2019 2135  LEUKOCYTESUR Negative 11/08/2013 1615      Component Value Date/Time   PHART 7.403 08/06/2019 1133   PCO2ART 30.1 (L) 08/06/2019 1133   PO2ART 124.0 (H) 08/06/2019 1133   HCO3 18.9 (L) 08/06/2019 1133   TCO2 20 (L) 08/06/2019 1133   ACIDBASEDEF 5.0 (H) 08/06/2019 1133   O2SAT 99.0 08/06/2019 1133    No results found for: IRON, TIBC, FERRITIN, IRONPCTSAT     ASSESSMENT/PLAN:    58 year old F chronic alcohol abuse, alcoholic hepatitis, dialysis dependent AKI, Klebsiella bacteremia and UTI, VDRF 1.  Acute kidney injury.  Is dialysis dependent.  Will keep even.  CVP 4.  Continue CRRT  2.  Acute alcoholic hepatitis.  On steroids.  GI following.  3.  Vent dependent respiratory failure.  Extubated 9/18.  4.  Metabolic acidosis.  Improved on CRRT.  5.  Shock.  Continues on norepinephrine.  Wean as able.   [place], DO, [place]

## 2019-08-13 NOTE — Progress Notes (Signed)
PHARMACY - PHYSICIAN COMMUNICATION CRITICAL VALUE ALERT - BLOOD CULTURE IDENTIFICATION (BCID)  Victoria Osborne is an 59 y.o. female who presented to [facility] on 08/26/2019 with a chief complaint of septic shock  Assessment:  Methicillin-sensitive coag-negative Staph growing in 1/2 bottles (aerobic bottle only) in blood cx   Name of physician (or Provider) Contacted: Dr. Erskine Emery  Current antibiotics: Ceftriaxone 2 gm IV q24h  Changes to prescribed antibiotics recommended:  None, CNS in 1/2 bottles, likely contaminant  Results for orders placed or performed during the hospital encounter of 08/25/2019  Blood Culture ID Panel (Reflexed) (Collected: 08/12/2019  6:05 AM)  Result Value Ref Range   Enterococcus species NOT DETECTED NOT DETECTED   Listeria monocytogenes NOT DETECTED NOT DETECTED   Staphylococcus species DETECTED (A) NOT DETECTED   Staphylococcus aureus (BCID) NOT DETECTED NOT DETECTED   Methicillin resistance NOT DETECTED NOT DETECTED   Streptococcus species NOT DETECTED NOT DETECTED   Streptococcus agalactiae NOT DETECTED NOT DETECTED   Streptococcus pneumoniae NOT DETECTED NOT DETECTED   Streptococcus pyogenes NOT DETECTED NOT DETECTED   Acinetobacter baumannii NOT DETECTED NOT DETECTED   Enterobacteriaceae species NOT DETECTED NOT DETECTED   Enterobacter cloacae complex NOT DETECTED NOT DETECTED   Escherichia coli NOT DETECTED NOT DETECTED   Klebsiella oxytoca NOT DETECTED NOT DETECTED   Klebsiella pneumoniae NOT DETECTED NOT DETECTED   Proteus species NOT DETECTED NOT DETECTED   Serratia marcescens NOT DETECTED NOT DETECTED   Haemophilus influenzae NOT DETECTED NOT DETECTED   Neisseria meningitidis NOT DETECTED NOT DETECTED   Pseudomonas aeruginosa NOT DETECTED NOT DETECTED   Candida albicans NOT DETECTED NOT DETECTED   Candida glabrata NOT DETECTED NOT DETECTED   Candida krusei NOT DETECTED NOT DETECTED   Candida parapsilosis NOT DETECTED NOT DETECTED   Candida tropicalis NOT DETECTED NOT DETECTED    [name] Z  [name], 08/13/2019  7:40 AM

## 2019-08-14 ENCOUNTER — Inpatient Hospital Stay (HOSPITAL_COMMUNITY): Payer: BLUE CROSS/BLUE SHIELD

## 2019-08-14 ENCOUNTER — Other Ambulatory Visit: Payer: Self-pay

## 2019-08-14 DIAGNOSIS — N170 Acute kidney failure with tubular necrosis: Secondary | ICD-10-CM

## 2019-08-14 LAB — HEPATIC FUNCTION PANEL
ALT: 106 U/L — ABNORMAL HIGH (ref 0–44)
AST: 177 U/L — ABNORMAL HIGH (ref 15–41)
Albumin: 2.6 g/dL — ABNORMAL LOW (ref 3.5–5.0)
Alkaline Phosphatase: 217 U/L — ABNORMAL HIGH (ref 38–126)
Bilirubin, Direct: 27.1 mg/dL — ABNORMAL HIGH (ref 0.0–0.2)
Total Bilirubin: 41.4 mg/dL (ref 0.3–1.2)
Total Protein: 5 g/dL — ABNORMAL LOW (ref 6.5–8.1)

## 2019-08-14 LAB — GLUCOSE, CAPILLARY
Glucose-Capillary: 104 mg/dL — ABNORMAL HIGH (ref 70–99)
Glucose-Capillary: 126 mg/dL — ABNORMAL HIGH (ref 70–99)
Glucose-Capillary: 198 mg/dL — ABNORMAL HIGH (ref 70–99)
Glucose-Capillary: 200 mg/dL — ABNORMAL HIGH (ref 70–99)
Glucose-Capillary: 208 mg/dL — ABNORMAL HIGH (ref 70–99)
Glucose-Capillary: 302 mg/dL — ABNORMAL HIGH (ref 70–99)

## 2019-08-14 LAB — CBC
HCT: 27.7 % — ABNORMAL LOW (ref 36.0–46.0)
Hemoglobin: 9.2 g/dL — ABNORMAL LOW (ref 12.0–15.0)
MCH: 35.2 pg — ABNORMAL HIGH (ref 26.0–34.0)
MCHC: 33.2 g/dL (ref 30.0–36.0)
MCV: 106.1 fL — ABNORMAL HIGH (ref 80.0–100.0)
Platelets: 58 10*3/uL — ABNORMAL LOW (ref 150–400)
RBC: 2.61 MIL/uL — ABNORMAL LOW (ref 3.87–5.11)
RDW: 26.7 % — ABNORMAL HIGH (ref 11.5–15.5)
WBC: 48.5 10*3/uL — ABNORMAL HIGH (ref 4.0–10.5)
nRBC: 2.8 % — ABNORMAL HIGH (ref 0.0–0.2)

## 2019-08-14 LAB — RENAL FUNCTION PANEL
Albumin: 2.6 g/dL — ABNORMAL LOW (ref 3.5–5.0)
Albumin: 2.5 g/dL — ABNORMAL LOW (ref 3.5–5.0)
Anion gap: 13 (ref 5–15)
Anion gap: 15 (ref 5–15)
BUN: 17 mg/dL (ref 6–20)
BUN: 16 mg/dL (ref 6–20)
CO2: 21 mmol/L — ABNORMAL LOW (ref 22–32)
CO2: 20 mmol/L — ABNORMAL LOW (ref 22–32)
Calcium: 7.7 mg/dL — ABNORMAL LOW (ref 8.9–10.3)
Calcium: 7.9 mg/dL — ABNORMAL LOW (ref 8.9–10.3)
Chloride: 104 mmol/L (ref 98–111)
Chloride: 102 mmol/L (ref 98–111)
Creatinine, Ser: 0.8 mg/dL (ref 0.44–1.00)
Creatinine, Ser: 0.94 mg/dL (ref 0.44–1.00)
GFR calc Af Amer: >60 mL/min (ref >60)
GFR calc Af Amer: >60 mL/min (ref >60)
GFR calc non Af Amer: >60 mL/min (ref >60)
GFR calc non Af Amer: >60 mL/min (ref >60)
Glucose, Bld: 210 mg/dL — ABNORMAL HIGH (ref 70–99)
Glucose, Bld: 221 mg/dL — ABNORMAL HIGH (ref 70–99)
Phosphorus: 2.3 mg/dL — ABNORMAL LOW (ref 2.5–4.6)
Phosphorus: 2.6 mg/dL (ref 2.5–4.6)
Potassium: 4.2 mmol/L (ref 3.5–5.1)
Potassium: 4.8 mmol/L (ref 3.5–5.1)
Sodium: 138 mmol/L (ref 135–145)
Sodium: 137 mmol/L (ref 135–145)

## 2019-08-14 LAB — PROTIME-INR
INR: 2.3 — ABNORMAL HIGH (ref 0.8–1.2)
Prothrombin Time: 24.9 seconds — ABNORMAL HIGH (ref 11.4–15.2)

## 2019-08-14 LAB — C DIFFICILE QUICK SCREEN W PCR REFLEX
C Diff antigen: NEGATIVE
C Diff interpretation: NOT DETECTED
C Diff toxin: NEGATIVE

## 2019-08-14 LAB — MAGNESIUM: Magnesium: 2.4 mg/dL (ref 1.7–2.4)

## 2019-08-14 LAB — FIBRINOGEN: Fibrinogen: 162 mg/dL — ABNORMAL LOW (ref 210–475)

## 2019-08-14 MED ORDER — CHLORHEXIDINE GLUCONATE CLOTH 2 % EX PADS
6.0000 | MEDICATED_PAD | Freq: Every day | CUTANEOUS | Status: DC
  Administered 2019-08-15: 6 via TOPICAL

## 2019-08-14 MED ORDER — OXYCODONE HCL 5 MG PO TABS: 5.0000 mg | ORAL_TABLET | Freq: Four times a day (QID) | ORAL | Status: DC | PRN

## 2019-08-14 MED ORDER — CHLORHEXIDINE GLUCONATE CLOTH 2 % EX PADS
6.0000 | MEDICATED_PAD | Freq: Every day | CUTANEOUS | Status: DC
  Administered 2019-08-14: 6 via TOPICAL

## 2019-08-14 MED ORDER — METRONIDAZOLE IN NACL 5-0.79 MG/ML-% IV SOLN
500.0000 mg | Freq: Three times a day (TID) | INTRAVENOUS | Status: DC
  Administered 2019-08-14 – 2019-08-15 (×2): 500 mg via INTRAVENOUS
  Filled 2019-08-14 (×2): qty 100

## 2019-08-14 MED ORDER — VASOPRESSIN 20 UNIT/ML IV SOLN
0.0300 [IU]/min | INTRAVENOUS | Status: DC
  Administered 2019-08-14: 11:00:00 0.03 [IU]/min via INTRAVENOUS
  Filled 2019-08-14 (×2): qty 2

## 2019-08-14 MED ORDER — VANCOMYCIN HCL IN DEXTROSE 750-5 MG/150ML-% IV SOLN
750.0000 mg | INTRAVENOUS | Status: DC
  Administered 2019-08-14: 750 mg via INTRAVENOUS
  Filled 2019-08-14 (×2): qty 150

## 2019-08-14 MED ORDER — ORAL CARE MOUTH RINSE
15.0000 mL | Freq: Two times a day (BID) | OROMUCOSAL | Status: DC
  Administered 2019-08-14 (×3): 15 mL via OROMUCOSAL

## 2019-08-14 MED ORDER — FENTANYL CITRATE (PF) 100 MCG/2ML IJ SOLN
25.0000 ug | INTRAMUSCULAR | Status: DC | PRN
  Administered 2019-08-14 – 2019-08-15 (×3): 25 ug via INTRAVENOUS
  Filled 2019-08-14 (×4): qty 2

## 2019-08-14 NOTE — Progress Notes (Addendum)
[facility] Progress Note Patient Name: Victoria Osborne DOB: [birth year] MRN: [id number]   Date of Service  08/14/2019  HPI/Events of Note  Pt is moaning in pain, pain in her back, abdomen.  She is undergoing CRRT and is on pressors.   eICU Interventions  Start fentanyl PRN.      Intervention Category Intermediate Interventions: Pain - evaluation and management  [name] 08/14/2019, 9:52 PM   2:30 AM Notified by RN that patient is tachypneic in the 30s, O2 stas were transiently in the low 90s. RN has noted increased in abdominal girth  Plan> Check CXR and ABG.  Check PT/INR with AM labs.  Pt will likely need large volume paracentesis.  3:35 AM Labs reviewed.  ABG shows worsening hypoxia.  CXR shows worsening infiltrate. PT also hypoglycemic.  Plan> Increase flow on airvo.  If she remains hypoxic, will need intubation. Restart tube feeds.

## 2019-08-14 NOTE — Procedures (Signed)
I evaluated the patient while on CRRT and discussed with bedside RN.  Dialysis is running without issue. 

## 2019-08-14 NOTE — Progress Notes (Signed)
[facility]    NEPHROLOGY PROGRESS NOTE  SUBJECTIVE: Patient seen and examined.  Confused, unable to provide ROS.   OBJECTIVE:  Vitals:   08/14/19 1210 08/14/19 1215  BP:  (!) 95/57  Pulse:  83  Resp:  19  Temp: (!) 97.2 F (36.2 C)   SpO2:  98%    Intake/Output Summary (Last 24 hours) at 08/14/2019 1238 Last data filed at 08/14/2019 1200 Gross per 24 hour  Intake 2481.59 ml  Output 2580 ml  Net -98.41 ml      General:  AAOx3 NAD HEENT: MMM [place] AT icteric sclera Neck:  No JVD, no adenopathy CV:  Heart RRR  Lungs:  L/S CTA bilaterally Abd:  Distended, with normal BS GU:  Bladder non-palpable Extremities:  No LE edema, positive dependent edema at the hip. Skin:  No skin rash, jaundiced  MEDICATIONS:  . B-complex with vitamin C  1 tablet Per Tube Daily  . Chlorhexidine Gluconate Cloth  6 each Topical Daily  . folic acid  1 mg Per Tube Daily  . insulin aspart  0-9 Units Subcutaneous Q4H  . insulin aspart  2 Units Subcutaneous Q4H  . ipratropium-albuterol  3 mL Nebulization BID  . lactulose  20 g Per Tube TID  . mouth rinse  15 mL Mouth Rinse BID  . midodrine  10 mg Per Tube TID WC  . pantoprazole (PROTONIX) IV  40 mg Intravenous Q24H  . prednisoLONE  40 mg Per Tube Daily  . rifaximin  550 mg Per Tube BID  . thiamine  100 mg Per Tube Daily       LABS:   CBC Latest Ref Rng & Units 08/14/2019 08/13/2019 08/12/2019  WBC 4.0 - 10.5 K/uL 48.5(H) 47.0(H) 38.3(H)  Hemoglobin 12.0 - 15.0 g/dL 9.2(L) 10.7(L) 10.1(L)  Hematocrit 36.0 - 46.0 % 27.7(L) 31.7(L) 29.6(L)  Platelets 150 - 400 K/uL 58(L) 69(L) 53(L)    CMP Latest Ref Rng & Units 08/14/2019 08/13/2019 08/13/2019  Glucose 70 - 99 mg/dL 210(H) 186(H) 182(H)  BUN 6 - 20 mg/dL 17 17 17   Creatinine 0.44 - 1.00 mg/dL 0.80 0.86 0.78  Sodium 135 - 145 mmol/L 138 138 139  Potassium 3.5 - 5.1 mmol/L 4.2 4.2 4.0  Chloride 98 - 111 mmol/L 104 105 105  CO2 22 - 32 mmol/L 21(L) 21(L) 21(L)  Calcium 8.9 -  10.3 mg/dL 7.7(L) 7.5(L) 7.3(L)  Total Protein 6.5 - 8.1 g/dL 5.0(L) - 4.7(L)  Total Bilirubin 0.3 - 1.2 mg/dL 41.4(HH) - 37.3(HH)  Alkaline Phos 38 - 126 U/L 217(H) - 284(H)  AST 15 - 41 U/L 177(H) - 180(H)  ALT 0 - 44 U/L 106(H) - 123(H)    Lab Results  Component Value Date   CALCIUM 7.7 (L) 08/14/2019   CAION 0.90 (L) 08/06/2019   PHOS 2.3 (L) 08/14/2019       Component Value Date/Time   COLORURINE GREEN (A) 08/26/2019 2135   APPEARANCEUR CLOUDY (A) 08/16/2019 2135   APPEARANCEUR Hazy 11/08/2013 1615   LABSPEC 1.020 08/18/2019 2135   LABSPEC 1.004 11/08/2013 1615   PHURINE 7.5 08/12/2019 2135   GLUCOSEU >=500 (A) 08/09/2019 2135   GLUCOSEU Negative 11/08/2013 1615   HGBUR LARGE (A) 08/24/2019 2135   BILIRUBINUR LARGE (A) 08/18/2019 2135   BILIRUBINUR negative 12/10/2017 0910   BILIRUBINUR Negative 11/08/2013 1615   KETONESUR 15 (A) 08/26/2019 2135   PROTEINUR >300 (A) 09/01/2019 2135   UROBILINOGEN 0.2 12/10/2017 0910   NITRITE NEGATIVE 08/17/2019 2135  LEUKOCYTESUR LARGE (A) 08/25/2019 2135   LEUKOCYTESUR Negative 11/08/2013 1615      Component Value Date/Time   PHART 7.403 08/06/2019 1133   PCO2ART 30.1 (L) 08/06/2019 1133   PO2ART 124.0 (H) 08/06/2019 1133   HCO3 18.9 (L) 08/06/2019 1133   TCO2 20 (L) 08/06/2019 1133   ACIDBASEDEF 5.0 (H) 08/06/2019 1133   O2SAT 99.0 08/06/2019 1133    No results found for: IRON, TIBC, FERRITIN, IRONPCTSAT     ASSESSMENT/PLAN:    58 year old F chronic alcohol abuse, alcoholic hepatitis, dialysis dependent AKI, Klebsiella bacteremia and UTI, VDRF 1.  Acute kidney injury.  Is dialysis dependent.  Will keep even.  CVP 4.  Continue CRRT  2.  Acute alcoholic hepatitis.  On steroids.  GI following.  3.  Vent dependent respiratory failure.  Extubated 9/18.  4.  Metabolic acidosis.  Improved on CRRT.  5.  Shock.  Continues on norepinephrine.  Wean as able.  Consider checking C-diff with high WBC count   [employer], DO,  FACP

## 2019-08-14 NOTE — Progress Notes (Signed)
Pharmacy Antibiotic Note  Victoria Osborne is a 58 y.o. female with known pneumonia, not worsening status new onset sepsis?  On crrt  Plan: Cefepime 2 g q12h Add vanc 750 mg q24h while on crrt F/U new cx lot lvls prn  Height: 5\' 7"  (170.2 cm) Weight: 160 lb 4.4 oz (72.7 kg) IBW/kg (Calculated) : 61.6  Temp (24hrs), Avg:96.7 F (35.9 C), Min:96.1 F (35.6 C), Max:97.4 F (36.3 C)  Recent Labs  Lab 08/10/19 0226  08/11/19 0254  08/12/19 0444 08/12/19 1600 08/13/19 0401 08/13/19 1652 08/14/19 0353 08/14/19 0641  WBC 43.9*  --  37.6*  --  38.3*  --  47.0*  --   --  48.5*  CREATININE 0.97  1.01*   < > 0.92   < > 0.81 0.77 0.78 0.86 0.80  --    < > = values in this interval not displayed.    Estimated Creatinine Clearance: 75.4 mL/min (by C-G formula based on SCr of 0.8 mg/dL).    No Known Allergies  Levester Fresh, PharmD, BCPS, BCCCP Clinical Pharmacist [phone]  Please check AMION for all [place] numbers  08/14/2019 10:26 AM

## 2019-08-14 NOTE — Progress Notes (Signed)
Assisted tele visit to patient with family member.  Ladan Vanderzanden P, RN  

## 2019-08-14 NOTE — Progress Notes (Signed)
Subjective: Patient seen and examined at bedside.  Appears lethargic and is continued on IV pressors.  Objective: Vital signs in last 24 hours: Temp:  [96.1 F (35.6 C)-97.4 F (36.3 C)] 97.2 F (36.2 C) (09/12 1210) Pulse Rate:  [65-90] 83 (09/12 1215) Resp:  [15-32] 19 (09/12 1215) BP: (70-116)/(44-104) 95/57 (09/12 1215) SpO2:  [93 %-100 %] 98 % (09/12 1215) Weight:  [72.7 kg] 72.7 kg (09/12 0237) Weight change: -0.8 kg Last BM Date: 08/14/19  PE: Receiving NG feeds GENERAL: Lethargic, deeply icteric ABDOMEN: Distended abdomen, tympanic to percussion in the mid area and dull to percussion on the sides suspicious for ascites EXTREMITIES: No deformity  Lab Results: Results for orders placed or performed during the hospital encounter of 08/24/2019 (from the past 48 hour(s))  Glucose, capillary     Status: Abnormal   Collection Time: 08/12/19  3:23 PM  Result Value Ref Range   Glucose-Capillary 188 (H) 70 - 99 mg/dL  Renal function panel (daily at 1600)     Status: Abnormal   Collection Time: 08/12/19  4:00 PM  Result Value Ref Range   Sodium 138 135 - 145 mmol/L   Potassium 3.5 3.5 - 5.1 mmol/L   Chloride 105 98 - 111 mmol/L   CO2 23 22 - 32 mmol/L   Glucose, Bld 204 (H) 70 - 99 mg/dL   BUN 17 6 - 20 mg/dL   Creatinine, Ser 0.77 0.44 - 1.00 mg/dL   Calcium 6.9 (L) 8.9 - 10.3 mg/dL   Phosphorus 4.5 2.5 - 4.6 mg/dL   Albumin 1.8 (L) 3.5 - 5.0 g/dL   GFR calc non Af Amer >60 >60 mL/min   GFR calc Af Amer >60 >60 mL/min   Anion gap 10 5 - 15    Comment: Performed at [facility], [place] [address], [place], [place] [address]  Glucose, capillary     Status: Abnormal   Collection Time: 08/12/19  7:33 PM  Result Value Ref Range   Glucose-Capillary 149 (H) 70 - 99 mg/dL  Glucose, capillary     Status: Abnormal   Collection Time: 08/13/19 12:13 AM  Result Value Ref Range   Glucose-Capillary 175 (H) 70 - 99 mg/dL  Glucose, capillary     Status: Abnormal   Collection Time:  08/13/19  3:43 AM  Result Value Ref Range   Glucose-Capillary 157 (H) 70 - 99 mg/dL  Renal function panel (daily at 0500)     Status: Abnormal   Collection Time: 08/13/19  4:01 AM  Result Value Ref Range   Sodium 139 135 - 145 mmol/L   Potassium 4.0 3.5 - 5.1 mmol/L   Chloride 105 98 - 111 mmol/L   CO2 21 (L) 22 - 32 mmol/L   Glucose, Bld 182 (H) 70 - 99 mg/dL   BUN 17 6 - 20 mg/dL   Creatinine, Ser 0.78 0.44 - 1.00 mg/dL   Calcium 7.3 (L) 8.9 - 10.3 mg/dL   Phosphorus 3.1 2.5 - 4.6 mg/dL   Albumin 1.7 (L) 3.5 - 5.0 g/dL   GFR calc non Af Amer >60 >60 mL/min   GFR calc Af Amer >60 >60 mL/min   Anion gap 13 5 - 15    Comment: Performed at [facility], [place] [address] [place], [place] [address]  Magnesium     Status: None   Collection Time: 08/13/19  4:01 AM  Result Value Ref Range   Magnesium 2.3 1.7 - 2.4 mg/dL  Comment: Performed at [facility], [place] [address], [place], [place] [address]  CBC     Status: Abnormal   Collection Time: 08/13/19  4:01 AM  Result Value Ref Range   WBC 47.0 (H) 4.0 - 10.5 K/uL   RBC 3.06 (L) 3.87 - 5.11 MIL/uL   Hemoglobin 10.7 (L) 12.0 - 15.0 g/dL   HCT 31.7 (L) 36.0 - 46.0 %   MCV 103.6 (H) 80.0 - 100.0 fL   MCH 35.0 (H) 26.0 - 34.0 pg   MCHC 33.8 30.0 - 36.0 g/dL   RDW 25.9 (H) 11.5 - 15.5 %   Platelets 69 (L) 150 - 400 K/uL    Comment: REPEATED TO VERIFY Immature Platelet Fraction may be clinically indicated, consider ordering this additional test [id number] CONSISTENT WITH PREVIOUS RESULT    nRBC 2.8 (H) 0.0 - 0.2 %    Comment: Performed at [facility], [place] [address], [place], [place] [address]  Hepatic function panel     Status: Abnormal   Collection Time: 08/13/19  4:01 AM  Result Value Ref Range   Total Protein 4.7 (L) 6.5 - 8.1 g/dL   Albumin 1.7 (L) 3.5 - 5.0 g/dL   AST 180 (H) 15 - 41 U/L   ALT 123 (H) 0 - 44 U/L   Alkaline Phosphatase 284 (H) 38 - 126 U/L   Total Bilirubin 37.3 (HH) 0.3 - 1.2 mg/dL     Comment: RESULTS CONFIRMED BY MANUAL DILUTION CRITICAL VALUE NOTED.  VALUE IS CONSISTENT WITH PREVIOUSLY REPORTED AND CALLED VALUE.    Bilirubin, Direct 24.9 (H) 0.0 - 0.2 mg/dL    Comment: RESULTS CONFIRMED BY MANUAL DILUTION   Indirect Bilirubin NOT CALCULATED 0.3 - 0.9 mg/dL    Comment: Performed at [facility] [address] [place], [place] [address]  Glucose, capillary     Status: Abnormal   Collection Time: 08/13/19  7:36 AM  Result Value Ref Range   Glucose-Capillary 106 (H) 70 - 99 mg/dL  Glucose, capillary     Status: Abnormal   Collection Time: 08/13/19 11:29 AM  Result Value Ref Range   Glucose-Capillary 146 (H) 70 - 99 mg/dL  Glucose, capillary     Status: Abnormal   Collection Time: 08/13/19  4:00 PM  Result Value Ref Range   Glucose-Capillary 150 (H) 70 - 99 mg/dL  Renal function panel (daily at 1600)     Status: Abnormal   Collection Time: 08/13/19  4:52 PM  Result Value Ref Range   Sodium 138 135 - 145 mmol/L   Potassium 4.2 3.5 - 5.1 mmol/L   Chloride 105 98 - 111 mmol/L   CO2 21 (L) 22 - 32 mmol/L   Glucose, Bld 186 (H) 70 - 99 mg/dL   BUN 17 6 - 20 mg/dL   Creatinine, Ser 0.86 0.44 - 1.00 mg/dL   Calcium 7.5 (L) 8.9 - 10.3 mg/dL   Phosphorus 3.0 2.5 - 4.6 mg/dL   Albumin 2.4 (L) 3.5 - 5.0 g/dL   GFR calc non Af Amer >60 >60 mL/min   GFR calc Af Amer >60 >60 mL/min   Anion gap 12 5 - 15    Comment: Performed at [facility], [place] [address], [place], [place] [address]  Glucose, capillary     Status: Abnormal   Collection Time: 08/13/19  7:37 PM  Result Value Ref Range   Glucose-Capillary 160 (H) 70 - 99 mg/dL  Glucose, capillary  Status: Abnormal   Collection Time: 08/13/19 11:04 PM  Result Value Ref Range   Glucose-Capillary 190 (H) 70 - 99 mg/dL  Glucose, capillary     Status: Abnormal   Collection Time: 08/14/19  3:50 AM  Result Value Ref Range   Glucose-Capillary 198 (H) 70 - 99 mg/dL  Renal function panel (daily at 0500)      Status: Abnormal   Collection Time: 08/14/19  3:53 AM  Result Value Ref Range   Sodium 138 135 - 145 mmol/L   Potassium 4.2 3.5 - 5.1 mmol/L   Chloride 104 98 - 111 mmol/L   CO2 21 (L) 22 - 32 mmol/L   Glucose, Bld 210 (H) 70 - 99 mg/dL   BUN 17 6 - 20 mg/dL   Creatinine, Ser 0.80 0.44 - 1.00 mg/dL   Calcium 7.7 (L) 8.9 - 10.3 mg/dL   Phosphorus 2.3 (L) 2.5 - 4.6 mg/dL   Albumin 2.6 (L) 3.5 - 5.0 g/dL   GFR calc non Af Amer >60 >60 mL/min   GFR calc Af Amer >60 >60 mL/min   Anion gap 13 5 - 15    Comment: Performed at [facility], [place] [address], [place], [place] [address]  Magnesium     Status: None   Collection Time: 08/14/19  3:53 AM  Result Value Ref Range   Magnesium 2.4 1.7 - 2.4 mg/dL    Comment: Performed at [facility] [address], [place], [place] [address]  Protime-INR     Status: Abnormal   Collection Time: 08/14/19  3:53 AM  Result Value Ref Range   Prothrombin Time 24.9 (H) 11.4 - 15.2 seconds   INR 2.3 (H) 0.8 - 1.2    Comment: (NOTE) INR goal varies based on device and disease states. Performed at [facility], [place] [address], [place], [place] [address]   Hepatic function panel     Status: Abnormal   Collection Time: 08/14/19  3:53 AM  Result Value Ref Range   Total Protein 5.0 (L) 6.5 - 8.1 g/dL   Albumin 2.6 (L) 3.5 - 5.0 g/dL   AST 177 (H) 15 - 41 U/L   ALT 106 (H) 0 - 44 U/L   Alkaline Phosphatase 217 (H) 38 - 126 U/L   Total Bilirubin 41.4 (HH) 0.3 - 1.2 mg/dL    Comment: RESULTS CONFIRMED BY MANUAL DILUTION CRITICAL VALUE NOTED.  VALUE IS CONSISTENT WITH PREVIOUSLY REPORTED AND CALLED VALUE.    Bilirubin, Direct 27.1 (H) 0.0 - 0.2 mg/dL    Comment: RESULTS CONFIRMED BY MANUAL DILUTION   Indirect Bilirubin NOT CALCULATED 0.3 - 0.9 mg/dL    Comment: Performed at [facility] [address], [place], [place] [address]  CBC     Status: Abnormal   Collection Time: 08/14/19  6:41 AM  Result Value Ref Range   WBC 48.5  (H) 4.0 - 10.5 K/uL   RBC 2.61 (L) 3.87 - 5.11 MIL/uL   Hemoglobin 9.2 (L) 12.0 - 15.0 g/dL   HCT 27.7 (L) 36.0 - 46.0 %   MCV 106.1 (H) 80.0 - 100.0 fL   MCH 35.2 (H) 26.0 - 34.0 pg   MCHC 33.2 30.0 - 36.0 g/dL   RDW 26.7 (H) 11.5 - 15.5 %   Platelets 58 (L) 150 - 400 K/uL    Comment: REPEATED TO VERIFY SPECIMEN CHECKED FOR CLOTS CONSISTENT WITH PREVIOUS RESULT    nRBC 2.8 (H) 0.0 - 0.2 %  Comment: Performed at [facility], [place] [address] [place], [place] [address]  Glucose, capillary     Status: Abnormal   Collection Time: 08/14/19  8:20 AM  Result Value Ref Range   Glucose-Capillary 208 (H) 70 - 99 mg/dL  Fibrinogen     Status: Abnormal   Collection Time: 08/14/19 10:23 AM  Result Value Ref Range   Fibrinogen 162 (L) 210 - 475 mg/dL    Comment: Performed at [facility] [address] [place], [place] [address]  Glucose, capillary     Status: Abnormal   Collection Time: 08/14/19 12:09 PM  Result Value Ref Range   Glucose-Capillary 200 (H) 70 - 99 mg/dL    Studies/Results: Dg Abd 1 View  Result Date: 08/14/2019 CLINICAL DATA:  Abdominal tenderness, perforation of bowel, sepsis. Dialysis. EXAM: ABDOMEN - 1 VIEW COMPARISON:  08/11/2019 FINDINGS: Enteric tube is present with tip over the distal stomach just right of midline in the mid abdomen. Bowel gas pattern is nonobstructive. No free peritoneal air. Remainder the exam is unchanged. IMPRESSION: Nonobstructive bowel gas pattern. Enteric tube with tip projecting over the distal stomach just right of midline. Electronically Signed   By: Marin Olp M.D.   On: 08/14/2019 12:14   Dg Chest Port 1 View  Result Date: 08/14/2019 CLINICAL DATA:  Abdominal tenderness with sepsis. Perforation of bowel. Dialysis. EXAM: PORTABLE CHEST 1 VIEW COMPARISON:  08/11/2019 FINDINGS: Right IJ dialysis catheter unchanged. Left IJ central venous catheter with tip over the SVC unchanged. Enteric tube courses into the stomach and off  the film as tip is not visualized. Patient slightly rotated to the right as there is bilateral patchy perihilar airspace consolidation which is worse suggesting multifocal infection. Cardiomediastinal silhouette and remainder of the exam is unchanged. IMPRESSION: Worsening patchy bilateral perihilar consolidation suggesting multifocal infection. Tubes and lines as described. Electronically Signed   By: Marin Olp M.D.   On: 08/14/2019 12:15    Medications: I have reviewed the patient's current medications.  Assessment: Encephalopathy and coagulopathy(2.3, worsening, elevated PT) indicating liver failure, receiving NG feedings, lactulose 20 g 3 times daily and Xifaxan 550 mg twice daily Alcoholic hepatitis, T bili 41.4, elevated AST/ALT ratio -continued on prednisolone 40 mg(to be continued for total 28 days) WBC 48.5, platelet 58 Septic shock continued on IV pressors Klebsiella pneumoniae bacteremia and urinary infection, tracheal aspirate showed  burkholderia-on IV cefepime, IV vancomycin Renal failure on CRRT Low fibrinogen  Plan: Guarded prognosis, no signs of improvement, worsening T bili and INR concerning. Unfortunately, besides continuing lactulose/Xifaxan, and prednisolone, no other change in medications can be recommended at this point. Patient to undergo ultrasound of the abdomen today to rule out ascites, if ascites noted would recommend diagnostic paracentesis to rule out SBP(although patient is already on IV antibiotics).   Ronnette Juniper 08/14/2019, 12:27 PM   Pager [phone] If no answer or after 5 PM call [phone]

## 2019-08-14 NOTE — Progress Notes (Signed)
NAME:  Victoria Osborne, MRN:  [id number], DOB:  1961-07-30, LOS: 57 ADMISSION DATE:  08/24/2019, CONSULTATION DATE:  9/2 REFERRING MD:  ER, CHIEF COMPLAINT:  AMS    Brief History   58 y/o F admitted 9/2 with altered mental status.  The patient was found at home lying on the floor unresponsive with 9 empty bottles of vodka.  She had noted visible jaundice the week preceding admit.  Work up notable for GNR UTI, bacteremia (klebsiella on BCID). Initially required mechanical ventilation, vasopressors and CVVHD.   Past Medical History  Anxiety  Depression  ETOH Abuse  Significant Hospital Events   9/02 Admit  9/03 Arrived to ICU obtunded, tachypneic > intubated 9/05 CVVHD, fentanyl gtt (63mcg), levophed 26 mcg, neosynephrine, vaso gtt's 9/06 Levo down to 22, vaso, fent increased to 200 mcg 9/8 weaning sedation, continuing CRRT. Interval increase in RLL opacity. Interval decrease in WBC, transaminases. Extubated. 9/9 Off Vaso, weaning NE. Remains on CRRT, remains extubated.  9/10: increasing levophed doses needed.   Consults:  PCCM   Procedures:  ETT 9/3 > 9/8 Central line 9/3 >>  Aline 9/3 >> out   Significant Diagnostic Tests:   Abd [place] 9/2> hepatic steatosis, cholelithiasis with associated gallbladder wall thickening.  Renal US 9/4> no hydronephrosis or renal mass CXR 9/8> Increased RLL opacity CXR 9/9> Interval removal of ETT. Mild interval improvement to R sided opacity. L sided atelectasis   Micro Data:  COVID 9/2 >> negative  BCID 9/2 >> klebsiella  BCx2 9/2 >> Klebsiella pneumoniae >> S-ceftriaxone UC 9/3 >> Klebsiella pneumoniae >> S-ceftriaxone  >> E-Coli >> S-ceftriaxone Tracheal aspirate 9/8>> Moderate yeast, rare GNR, burkholderia.   Antimicrobials:  Vanco 9/2 >> 9/4 Cefepime 9/2 >> 9/4  Ceftriaxone 9/4 >> 9/11  Cefepime 9/11 -->  Interim history/subjective:  Remains on levophed Still encephalopathic  Objective   Blood pressure (!) 86/51, pulse 86,  temperature (!) 96.8 F (36 C), temperature source Axillary, resp. rate (!) 32, height 5\' 7"  (1.702 m), weight 72.7 kg, SpO2 97 %. CVP:  [4 mmHg] 4 mmHg      Intake/Output Summary (Last 24 hours) at 08/14/2019 0834 Last data filed at 08/14/2019 0815 Gross per 24 hour  Intake 2611.28 ml  Output 2382 ml  Net 229.28 ml   Filed Weights   08/12/19 0442 08/13/19 0232 08/14/19 0237  Weight: 75.3 kg 73.5 kg 72.7 kg    Examination: GEN: jaundiced ill appearing woman HEENT: MM dry, trachea midline, scleral icterus CV: RRR, holosystolic murmur over apex PULM: Clear bilaterally without accessory muscle use GI: protuberant, slightly distended and tender.   EXT: no edema NEURO: moves all 4 ext but not to command.  Not oriented.  Says "No" when I press on her belly.  SKIN: jaundice   Resolved Hospital Problem list     Acute Hypoxic Respiratory Failure  Assessment & Plan:  # Alcoholic and shock liver- Bilirubin increasing, LFTs otherwise downtrending. INR slightly up today.   Check fibrinogen.  On prednisolone x 28 day total course  DIC:  Schistocytes present 9/10.   INR increased today.  Check Fibrinogen today.  TTP HUS?   # Septic shock 2/2 klebsiella bacteremia, klebsiella and e. Coli UTI- s/p 7 days ceftriaxone. Increasing pressor needs, slightly worse encephalopathy (?).  No fevers, actually hypothermic. Burkholderia from ET tube aspirate from 9/8.  Blood cultures from 9/10 neg.   Cont levophed, add vasopressin.  On midodrine.  Albumin not really helpful, will d/c.  Cefepime restarted  9/10, 7 day course planned.   Add back vanc today.  Check c dif given VERY elevated WBC and abd tenderness.  CXR, AXR today.  Recheck Abd [place] to eval for ascites to tap.   PNA, CAP - CPT, IS, flutter  # Encephalopathy- hepatic and ICU-related Cont lactulose.  Rifaximin.   Good stool output.   # Acute renal failure- on CRRT.  Anuric.  - CRRT net even to negative, appreciate nephrology help   # Severe protein calorie malnutrition- present on admission Cont tube feeds as tolerates.    - Guarded prognosis remains, patient is okay for reintubation but not CPR.  If reintubation occurs, this is to allow family to visit prior to her passing.   Best practice:  Diet: EN  Pain/Anxiety/Delirium protocol (if indicated): none VAP protocol (if indicated):none  DVT prophylaxis: SCD GI prophylaxis: PPI  Glucose control: SSI, sensitive scale Mobility: maybe up to chair later today Code Status: limited, see above  Family Communication: Son Merry Proud is primary contact. He was updated at length yesterday, no real big changes to plan today Disposition: ICU    Total critical care time: 60 minutes

## 2019-08-14 NOTE — Progress Notes (Signed)
Assisted tele visit to patient with family member.  Jasenia Weilbacher P, RN  

## 2019-08-14 NOTE — Progress Notes (Signed)
[name], patients son, on current condition over the phone. He is very grateful for the care she is receiving. Informed him that he will be updated if patient condition changes.

## 2019-08-15 ENCOUNTER — Inpatient Hospital Stay (HOSPITAL_COMMUNITY): Payer: BLUE CROSS/BLUE SHIELD

## 2019-08-15 LAB — BLOOD GAS, ARTERIAL
Acid-base deficit: 15.8 mmol/L — ABNORMAL HIGH (ref 0.0–2.0)
Bicarbonate: 9.5 mmol/L — ABNORMAL LOW (ref 20.0–28.0)
Drawn by: 350431
O2 Content: 11 L/min
O2 Saturation: 81.6 %
Patient temperature: 96
pCO2 arterial: 18.3 mmHg — CL (ref 32.0–48.0)
pH, Arterial: 7.326 — ABNORMAL LOW (ref 7.350–7.450)
pO2, Arterial: 51.2 mmHg — ABNORMAL LOW (ref 83.0–108.0)

## 2019-08-15 LAB — HEPATIC FUNCTION PANEL
ALT: 138 U/L — ABNORMAL HIGH (ref 0–44)
AST: 335 U/L — ABNORMAL HIGH (ref 15–41)
Albumin: 2.4 g/dL — ABNORMAL LOW (ref 3.5–5.0)
Alkaline Phosphatase: 273 U/L — ABNORMAL HIGH (ref 38–126)
Bilirubin, Direct: 28.3 mg/dL — ABNORMAL HIGH (ref 0.0–0.2)
Indirect Bilirubin: 15.3 mg/dL — ABNORMAL HIGH (ref 0.3–0.9)
Total Bilirubin: 43.6 mg/dL (ref 0.3–1.2)
Total Protein: 5.3 g/dL — ABNORMAL LOW (ref 6.5–8.1)

## 2019-08-15 LAB — GLUCOSE, CAPILLARY
Glucose-Capillary: 58 mg/dL — ABNORMAL LOW (ref 70–99)
Glucose-Capillary: 113 mg/dL — ABNORMAL HIGH (ref 70–99)
Glucose-Capillary: 154 mg/dL — ABNORMAL HIGH (ref 70–99)

## 2019-08-15 LAB — PROTIME-INR
INR: 2.7 — ABNORMAL HIGH (ref 0.8–1.2)
Prothrombin Time: 28.5 seconds — ABNORMAL HIGH (ref 11.4–15.2)

## 2019-08-15 LAB — CBC
HCT: 31.9 % — ABNORMAL LOW (ref 36.0–46.0)
Hemoglobin: 10.3 g/dL — ABNORMAL LOW (ref 12.0–15.0)
MCH: 35.9 pg — ABNORMAL HIGH (ref 26.0–34.0)
MCHC: 32.3 g/dL (ref 30.0–36.0)
MCV: 111.1 fL — ABNORMAL HIGH (ref 80.0–100.0)
Platelets: 74 10*3/uL — ABNORMAL LOW (ref 150–400)
RBC: 2.87 MIL/uL — ABNORMAL LOW (ref 3.87–5.11)
RDW: 28.3 % — ABNORMAL HIGH (ref 11.5–15.5)
WBC: 72.6 10*3/uL (ref 4.0–10.5)
nRBC: 5.2 % — ABNORMAL HIGH (ref 0.0–0.2)

## 2019-08-15 LAB — MAGNESIUM: Magnesium: 2.9 mg/dL — ABNORMAL HIGH (ref 1.7–2.4)

## 2019-08-15 LAB — RENAL FUNCTION PANEL
Albumin: 2.3 g/dL — ABNORMAL LOW (ref 3.5–5.0)
Anion gap: 20 — ABNORMAL HIGH (ref 5–15)
BUN: 16 mg/dL (ref 6–20)
CO2: 13 mmol/L — ABNORMAL LOW (ref 22–32)
Calcium: 7.9 mg/dL — ABNORMAL LOW (ref 8.9–10.3)
Chloride: 106 mmol/L (ref 98–111)
Creatinine, Ser: 0.91 mg/dL (ref 0.44–1.00)
GFR calc Af Amer: >60 mL/min (ref >60)
GFR calc non Af Amer: >60 mL/min (ref >60)
Glucose, Bld: 78 mg/dL (ref 70–99)
Phosphorus: 4.6 mg/dL (ref 2.5–4.6)
Potassium: 5.1 mmol/L (ref 3.5–5.1)
Sodium: 139 mmol/L (ref 135–145)

## 2019-08-15 LAB — CULTURE, BLOOD (ROUTINE X 2)

## 2019-08-15 MED ORDER — MORPHINE SULFATE (PF) 2 MG/ML IV SOLN
2.0000 mg | INTRAVENOUS | Status: DC | PRN
  Administered 2019-08-15: 2 mg via INTRAVENOUS
  Filled 2019-08-15: qty 1

## 2019-08-15 MED ORDER — SODIUM CHLORIDE 4 MEQ/ML IV SOLN: INTRAVENOUS | Status: DC

## 2019-08-15 MED ORDER — DEXTROSE 50 % IV SOLN
INTRAVENOUS | Status: AC
  Administered 2019-08-15: 50 mL
  Filled 2019-08-15: qty 50

## 2019-08-17 LAB — CULTURE, BLOOD (ROUTINE X 2): Culture: NO GROWTH

## 2019-08-17 LAB — ADAMTS13 ANTIBODY: ADAMTS13 Antibody: 5 Units/mL (ref <12)

## 2019-08-17 LAB — ADAMTS13 ACTIVITY: Adamts 13 Activity: 24.4 % — CL (ref >66.8)

## 2019-08-20 ENCOUNTER — Telehealth: Payer: Self-pay

## 2019-08-20 NOTE — Telephone Encounter (Signed)
Received dc from [employer].  DC is for cremation and a patient of Doctor Collier Bullock.  DC will be taken to Pulmonary Unit for signature.   On 08/20/2019 Received dc back from Doctor Wert.  I called the funeral home to let them know the dc is ready for pickup.

## 2019-09-02 NOTE — Progress Notes (Signed)
Elink made aware of pts WBC 72.6. Waiting on other labs.

## 2019-09-02 NOTE — Progress Notes (Signed)
CBG=58. 1 amp of D50 given. Will recheck sugar shortly.

## 2019-09-02 NOTE — Progress Notes (Addendum)
[place] Progress Note Patient Name: Victoria Osborne DOB: 1960-12-12 MRN: [id number]   Date of Service  09-03-2019  HPI/Events of Note  The patient's pressor requirements going up through the night, O2 as well.  Leukocytosis now 72K.  LFTS worsening.  AST 177 --> 335.  AKT 106 --> 138.  TB 41.4 --> 43.6.  We increased heated humidifier after hypoxemia noted on the ABG.   The patient  has a partial code which indicates that the patient is ok for intubation, but no ACLS meds.   Received another call from the RN that patient is hypotensive in the 50s despite max levophed.   eICU Interventions  Called to update the patient's son Victoria Osborne and patient's boyfriend that I believe the patient will pass shortly without aggressive measures.  I have counseled the son that chest compressions will not be of help and he is in agreement with keeping her DNR .  I have asked the boyfriend to come and he is at the bedside.  We will continue pressors. I have asked the RN to stop CRRT to improve her BP at this time. Discussing with son goals of care.      Intervention Category Major Interventions: End of life / care limitation discussion  [name] 09-03-2019, 5:06 AM   6:14 AM Discussed with son Victoria Osborne and boyfriend at length, updated them on worsening labs and they are in agreement to make the patient not suffer anymore and focus on her comfort. Code status changed to DNR.  Comfort measures only.

## 2019-09-02 NOTE — Progress Notes (Signed)
Chaplain responded to a referral for end of life.  The chaplain supported the family through the ministry of presence.  Brion Aliment Chaplain Resident For questions concerning this note please contact me by pager [phone]

## 2019-09-02 NOTE — Progress Notes (Signed)
Pt asystole on the monitor, Heart and lungs auscultated by myself and Cristopher Peru, RN. No heart or lung sounds ascultated. Pt expired 0745. Dr. Chase Caller notfied. Pt's boyfriend at the bedside. Pt's son Merry Proud was notified and given bed placement number to update about funeral home.

## 2019-09-02 NOTE — Progress Notes (Signed)
Elink notified of patients HR sustaining in 40-50's. BP in [id number] systolic. Patient is a PARTIAL CODE, ONLY INTUBATION AND BIPAP. Dr. James Ivanoff calling the son who is in [place] and the "boyfriend", [facility] [employer] [facility], who is in town. Vaso is running at shock dose and Levo titrated up to 45 mcg's, per Dr. James Ivanoff verbal order.  BP 50/30 and HR 43. Decision made to return blood from CRRT machine to give [facility] a chance to get here before she passes. Dr. James Ivanoff agreeable with this. Patients son, Merry Proud, on phone with Dr. James Ivanoff and [facility] en route to hospital. ED security made aware of boyfriends arrival. BP now 103/89 and HR 64.  Levo and Vaso still at max doses, HFNC@16L . Dr. James Ivanoff has talked to [facility] 2-3 times about how to make pt comfortable. He only wants the pressors to run as of right now. I  Have called Elink about a dose of morphine to help pt better relax and be more comfortable. Dr. James Ivanoff will make that decision after calling Merry Proud back @0600 . Manoli given water and tissue. Does not need anything else right now. Explained to boyfriend again that the vaso and levo were only maxed to give him a chance to get here before she passes. He verbalized understanding. Waiting on orders from Dr. James Ivanoff. Continuing to monitor patient closely. Patients BP 71/39 and HR 53

## 2019-09-02 NOTE — Progress Notes (Addendum)
Victoria Osborne, son, changed patients code status to DNR, comfort measures. 2 mg of morphine given to patient for breathing. DNR bracelet placed on patient. Boyfriend, Manoli, still at bedside. Will continue to monitor.

## 2019-09-02 NOTE — Progress Notes (Signed)
Titraiting levo and vaso for [facility], boyfriends, comfort.

## 2019-09-02 NOTE — Progress Notes (Signed)
Was called to patient beside, Mindy, RN stated patient has been desating.  RN had already bumped flow up to 6L and patient was still in 60s.  I placed patient on [place] High Flow at 10L, sat is now at 90%.  Mindy, RN placed call to [place], RN in box and asked for a CXR.  BS are worse than before, patient sounds a little Coarse.  Will continue to monitor.

## 2019-09-02 DEATH — deceased

## 2019-09-09 IMAGING — DX DG CHEST 2V
2 series · 2 of 2 positions shown · non-contrast
Comparison: None in PACs

CLINICAL DATA: Shortness of breath and nausea. History of alcohol
dependence, former smoker.

EXAM:
CHEST - 2 VIEW

[chest pa]
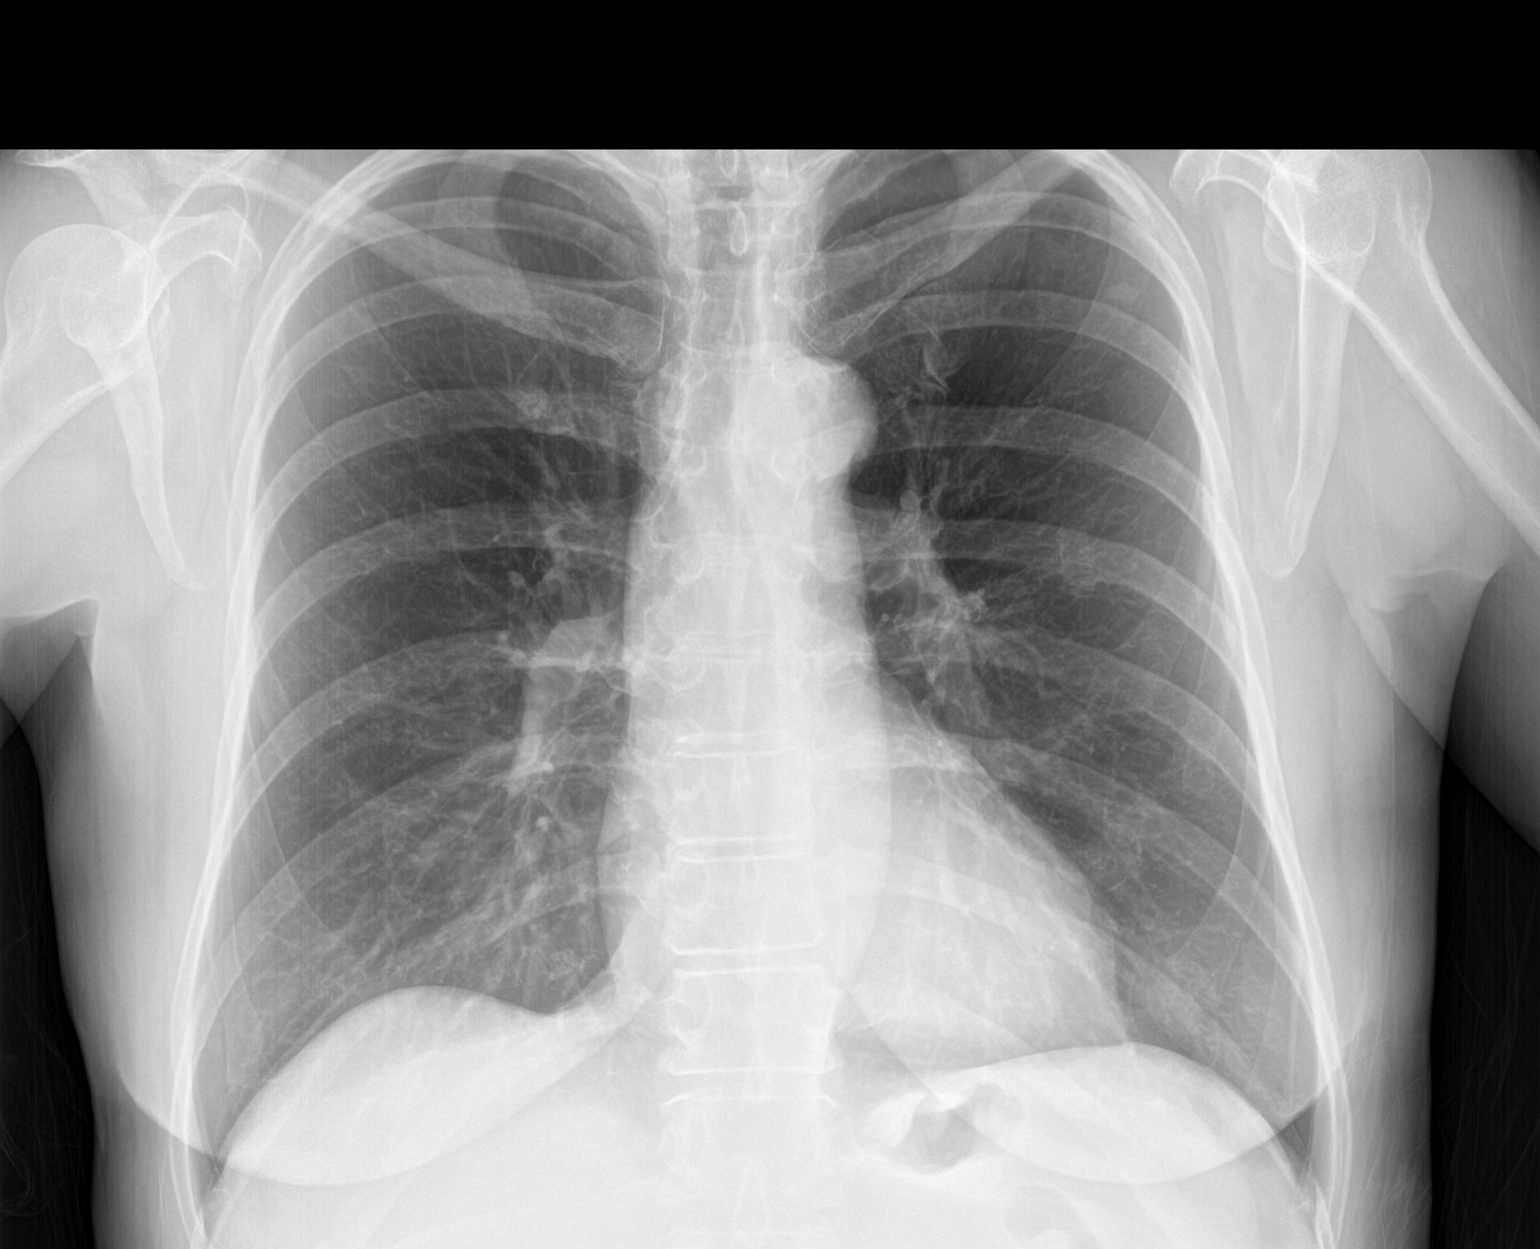

[chest lat]
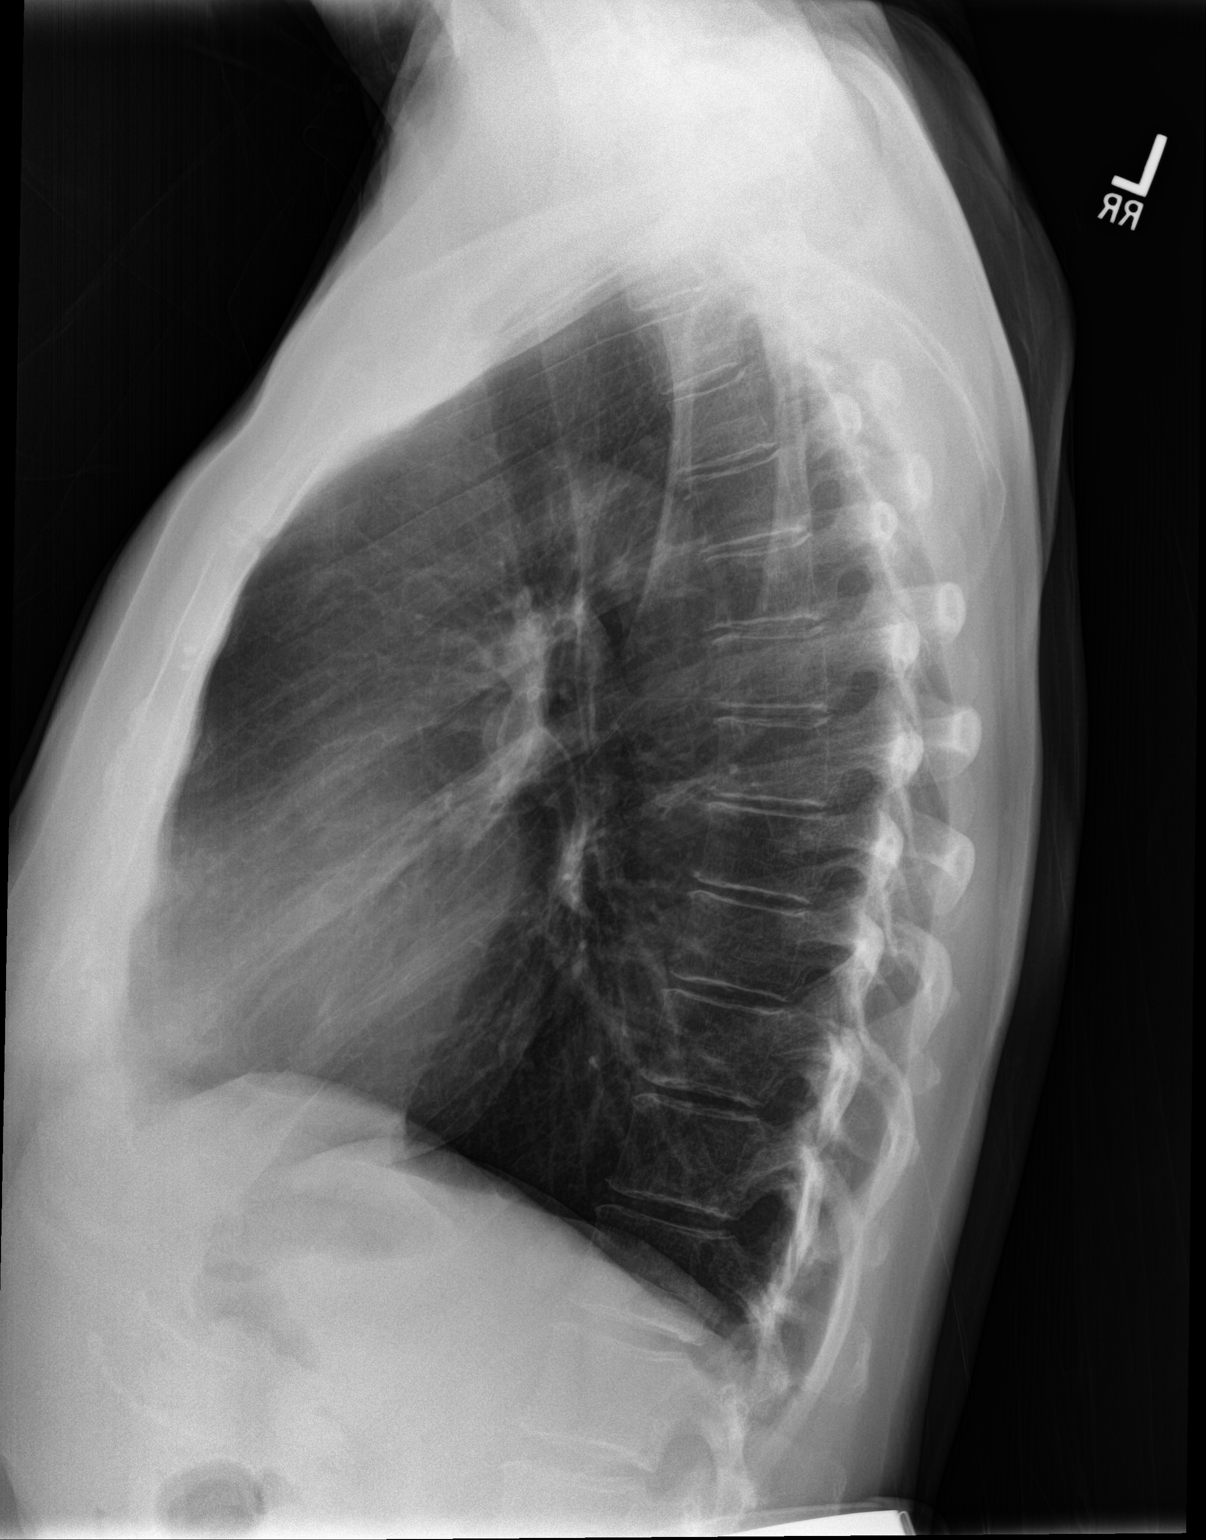

[2 of 2 positions shown; findings below may reference images not displayed]

FINDINGS: The lungs are subjectively adequately mineralized. There is a 6 x 9
by 6 mm nodule that projects in the left upper lobe between the
posterior aspects of the fourth and fifth ribs and overlies the
anterior aspect of the second rib. There is no alveolar infiltrate
or pleural effusion. The heart and pulmonary vascularity are normal.
The mediastinum is normal in width. There is calcification in the
wall of the aortic arch. The bony thorax exhibits no acute
abnormality.
IMPRESSION: No pneumonia nor CHF. Subtle soft tissue nodularity projecting in
the left upper lobe. Chest CT scanning is recommended to exclude
occult malignancy.

These results will be called to the ordering clinician or
representative by the Radiologist Assistant, and communication
documented in the PACS or zVision Dashboard.

## 2019-10-03 NOTE — Death Summary Note (Signed)
Victoria Osborne was a 58 y.o. female admitted on 08/11/2019 with altered mental status after being found at home on the floor surrounded by empty bottles of alcohol.  She was jaundiced.  Found to have UTI with Klebsiella and E coli, and Klebsiella bacteremia.  Required intubation for airway protection, pressors, antibiotics, and CRRT.  Nephrology and GI consulted.  Her conditioned become progressively worse.  Family opted for DNR status.  She developed asystole on 08/17/19 at 745 AM.  Final diagnoses: Acute alcoholic hepatitis Shock liver E coli and Klebsiella UTI Klebsiella bacteremia HCAP with Burkholderia species in sputum Septic shock Acute hypoxic respiratory failure Acute metabolic encephalopathy Acute renal failure from ATN Severe protein calorie malnutrition Coagulopathy in setting of liver failure Metabolic acidosis Anemia of critical illness and chronic disease Thrombocytopenia from sepsis and liver failure  Chesley Mires, MD [facility] Pulmonary/Critical Care 09/06/2019, 5:55 PM

## 2021-02-08 IMAGING — DX DG ABD PORTABLE 1V
1 series · 1 of 1 positions shown · non-contrast
Comparison: Five days ago

CLINICAL DATA: Nasogastric tube placement

EXAM:
PORTABLE ABDOMEN - 1 VIEW

[abdomen kub]
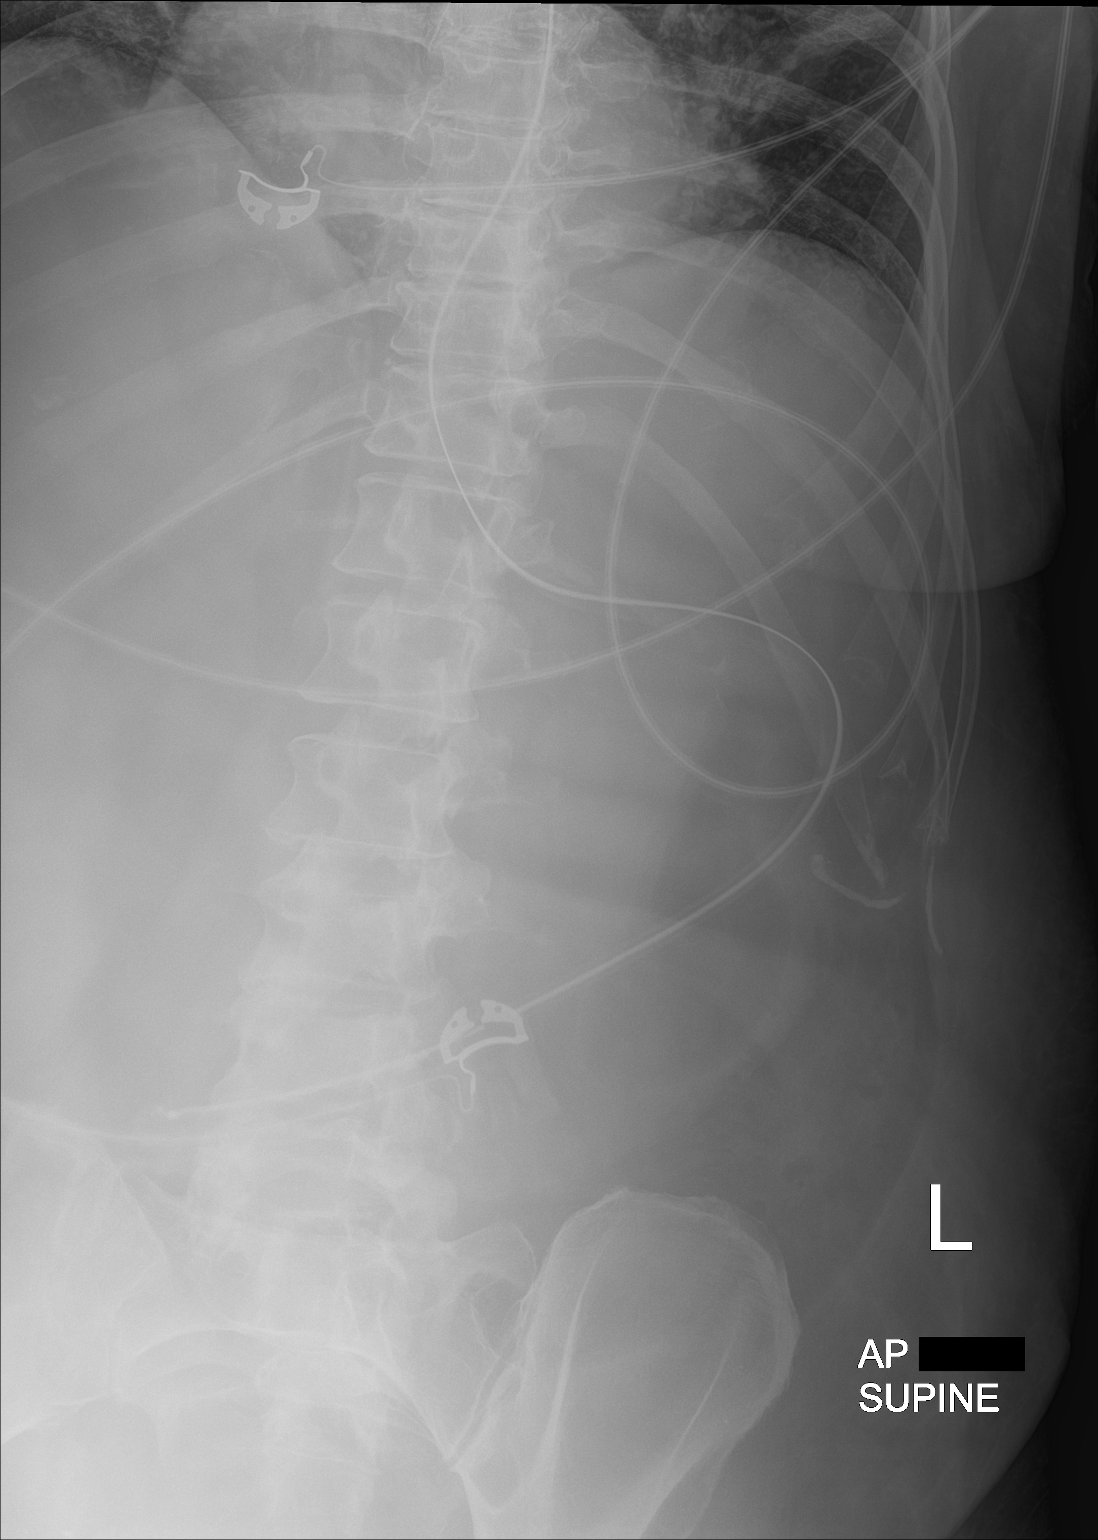

[1 of 1 positions shown; findings below may reference images not displayed]

FINDINGS: Nasogastric tube tip is at the distal stomach. Low lying stomach.
The visualized bowel gas pattern is nonobstructive. Lower lung
opacities, see dedicated chest x-ray.
IMPRESSION: Nasogastric tube with tip at the distal stomach.
